# Patient Record
Sex: Male | Born: 1955 | Race: White | Hispanic: No | Marital: Married | Smoking: Current every day smoker
Health system: Southern US, Community
[De-identification: ages and names within clinical notes are randomized; demographics above are authoritative.]

## PROBLEM LIST (undated history)

## (undated) DIAGNOSIS — C169 Malignant neoplasm of stomach, unspecified: Secondary | ICD-10-CM

## (undated) DIAGNOSIS — K219 Gastro-esophageal reflux disease without esophagitis: Secondary | ICD-10-CM

## (undated) DIAGNOSIS — IMO0001 Reserved for inherently not codable concepts without codable children: Secondary | ICD-10-CM

## (undated) DIAGNOSIS — I10 Essential (primary) hypertension: Secondary | ICD-10-CM

## (undated) DIAGNOSIS — C801 Malignant (primary) neoplasm, unspecified: Secondary | ICD-10-CM

## (undated) HISTORY — PX: EXTERNAL EAR SURGERY: SHX627

## (undated) HISTORY — PX: HERNIA REPAIR: SHX51

## (undated) HISTORY — PX: OTHER SURGICAL HISTORY: SHX169

---

## 1998-09-23 ENCOUNTER — Emergency Department (HOSPITAL_COMMUNITY): Admission: EM | Admit: 1998-09-23 | Discharge: 1998-09-23 | Payer: Self-pay | Admitting: *Deleted

## 1998-09-23 ENCOUNTER — Encounter: Payer: Self-pay | Admitting: Emergency Medicine

## 1999-02-22 ENCOUNTER — Other Ambulatory Visit: Admission: RE | Admit: 1999-02-22 | Discharge: 1999-02-22 | Payer: Self-pay | Admitting: Orthopedic Surgery

## 1999-09-29 ENCOUNTER — Ambulatory Visit (HOSPITAL_BASED_OUTPATIENT_CLINIC_OR_DEPARTMENT_OTHER): Admission: RE | Admit: 1999-09-29 | Discharge: 1999-09-29 | Payer: Self-pay | Admitting: Orthopedic Surgery

## 2006-04-17 ENCOUNTER — Ambulatory Visit (HOSPITAL_COMMUNITY): Admission: RE | Admit: 2006-04-17 | Discharge: 2006-04-17 | Payer: Self-pay | Admitting: Internal Medicine

## 2006-04-17 ENCOUNTER — Ambulatory Visit: Payer: Self-pay | Admitting: Internal Medicine

## 2008-05-20 ENCOUNTER — Ambulatory Visit (HOSPITAL_COMMUNITY): Admission: RE | Admit: 2008-05-20 | Discharge: 2008-05-20 | Payer: Self-pay | Admitting: Family Medicine

## 2008-06-03 ENCOUNTER — Ambulatory Visit (HOSPITAL_COMMUNITY): Admission: RE | Admit: 2008-06-03 | Discharge: 2008-06-03 | Payer: Self-pay | Admitting: Family Medicine

## 2010-08-05 ENCOUNTER — Ambulatory Visit (HOSPITAL_COMMUNITY)
Admission: RE | Admit: 2010-08-05 | Discharge: 2010-08-05 | Payer: Self-pay | Source: Home / Self Care | Admitting: Family Medicine

## 2010-09-22 ENCOUNTER — Ambulatory Visit (HOSPITAL_COMMUNITY)
Admission: RE | Admit: 2010-09-22 | Discharge: 2010-09-22 | Payer: Self-pay | Source: Home / Self Care | Attending: Internal Medicine | Admitting: Internal Medicine

## 2010-10-09 ENCOUNTER — Encounter: Payer: Self-pay | Admitting: Orthopedic Surgery

## 2010-10-12 ENCOUNTER — Ambulatory Visit: Admit: 2010-10-12 | Payer: Self-pay | Admitting: Orthopedic Surgery

## 2011-02-04 NOTE — Op Note (Signed)
Randy Le, Randy Le               ACCOUNT NO.:  [id number]   MEDICAL RECORD NO.:  [id number]          PATIENT TYPE:  AMB   LOCATION:  DAY                           FACILITY:  [facility]   PHYSICIAN:  R. Roetta Sessions, M.D. DATE OF BIRTH:  01-26-1956   DATE OF PROCEDURE:  04/17/2006  DATE OF DISCHARGE:                                 OPERATIVE REPORT   SCREENING COLONOSCOPY   INDICATIONS FOR PROCEDURE:  The patient is a 55 year old Caucasian male  devoid of any lower GI tract symptoms sent over at the courtesy of Dr. Loraine Leriche  __________ for colorectal screening.  He has never had his lower GI tract  imaged.  There is no family history of colorectal neoplasia.  Colonoscopy is  now being done as a screening maneuver.  This approach has been discussed  with the patient at length.  Potential risks, benefits and alternatives have  been reviewed and questions answered.  He is agreeable.  Please see  documentation in the medical record.   PROCEDURE NOTE:  O2 saturation, blood pressure, pulse and respirations were  monitored throughout the entire procedure.   CONSCIOUS SEDATION:  Versed 4 mg IV, Demerol 100 mg IV in divided doses.   INSTRUMENT:  Olympus video chip system.   FINDINGS:  Digital rectal exam revealed no abnormalities.   ENDOSCOPIC FINDINGS:  Prep was adequate.   Rectum:  Examination of the rectal mucosa including retroflex view of the  anal verge revealed no abnormalities.   Colon:  Colonic mucosa was surveyed from the rectosigmoid junction through  the left transverse and right colon to the area of the appendiceal orifice,  ileocecal valve and cecum.  These structures were well seen and photographed  for the record.  The scope was withdrawn and all previously mentioned  mucosal surfaces were again seen.  The colonic mucosa appeared normal.  He  patient tolerated the procedure well and was reactive to endoscopy.   IMPRESSION:  1.  Internal hemorrhoids, otherwise normal  rectum.  2.  Normal colon.   RECOMMENDATIONS:  Repeat screening colonoscopy in 10 years.      Jonathon Bellows, M.D.  Electronically Signed     RMR/MEDQ  D:  04/17/2006  T:  04/17/2006  Job:  [id number]

## 2011-02-04 NOTE — Op Note (Signed)
[place]  Patient:    Randy Le                       MRN: [id number] Proc. Date: 09/29/99 Adm. Date:  [id number] Attending:  Ronne Binning                           Operative Report  PREOPERATIVE DIAGNOSIS:  Right ring finger volar laceration, with loss of profunda function.  POSTOPERATIVE DIAGNOSIS:  Right ring finger volar laceration, with loss of profunda function.  PROCEDURE:  Exploration and repair of flexor digitorum profundus tendon, zone 2 -- right ring finger.  SURGEON:  Artist Pais. Mina Marble, M.D.  ANESTHESIA:  Axillary block.  TOURNIQUET TIME:  61 minutes.  COMPLICATIONS:  None.  DRAINS:  None.  OPERATIVE REPORT:  The patient was taken to the operating room after the induction of adequate axillary block analgesia.  The right upper extremity was prepped and draped in the usual sterile fashion.  An Esmarch was used to exsanguinate the limb, and a tourniquet was inflated to 250 mmHg.  At this point and time transverse laceration of the PIP flexion crease was extended in mid lateral fashion, both proximally and distally to expose the underlying zone 2 area of the flexor tendon of the ring finger on the right.  Exploration revealed a complete profundus laceration and the profundus proximal belly had retracted into the palm.  A Carroll tendon retriever was used to retrieve the tendon and was pulled into the center of the wound.  At this point and time the distal aspect of the profundus tendon was found to be just under the distal edge of the A4 pulley.  It was gently pulled rom underneath the A4 pulley.  At this point and time #3 Ethibond was used in a modified Tagima-type suture on both tendon stumps; and then the distal stump was pulled under the A4 pulley, the proximal stump as well.  A repair was performed  just distal to the A4 pulley.  After the repair using the 3.0 Ethibond was performed, a 6.0 running  ______ Prolene stitch was used for the final repair. he wound was thoroughly irrigated and hemostasis achieved with bipolar cautery; closed with 4.0 and 5.0 nylon in a combination of simple horizontal mattress sutures.  Sterile dressing with xeroform, 4 x 4 ______ and a dorsal eccentric block splint was applied.  The patient tolerated the procedure well and went to the recovery  room in stable fashion. DD:  09/29/99 TD:  09/29/99 Job: [id number]

## 2011-02-04 NOTE — Op Note (Signed)
[place]  Patient:    Randy Le                       MRN: [id number] Proc. Date: 09/29/99 Adm. Date:  [id number] Attending:  Ronne Binning                           Operative Report  PREOPERATIVE DIAGNOSIS: Right ring finger volar laceration with loss of profundus function.  POSTOPERATIVE DIAGNOSIS: Right ring finger volar laceration with loss of profundus function.  OPERATION: Exploration and repair of flexor digitorum profundus tendon, zone 2,  right ring finger.  SURGEON: Artist Pais. Mina Marble, M.D.  ANESTHESIA: Axillary block.  TOURNIQUET TIME: 61 minutes.  COMPLICATIONS: None.  DRAINS: None.  DESCRIPTION OF PROCEDURE: The patient was taken to the operating room after the  induction of adequate axillary block analgesia.  The right upper extremity was prepped and draped in the usual sterile fashion.  An Esmarch was used to exsanguinate the limb and the tourniquet was inflated to 250 mmHg.  At this point in time a transverse laceration of the PIP flexion crease was extended in the midlateral fashion, both proximally and distally, to expose the underlying zone 2 area of the flexor tendon of the ring finger on the right.  Exploration revealed a complete profundus laceration and the profundus proximal belly had retracted into the palm.  A Carroll tendon retriever was used to retrieve the tendon.  It was pulled into the center of the wound. At this point in time the distal aspect of the profundus tendon was found to be just under the distal edge of the A4 pulley. t was gently pulled from underneath the A4 pulley.  At this point in time #3 Ethibond was used in a modified attachment type suture on both tendon stumps and then the distal stump was pulled under the A4 pulley and the proximal stump as well and  repair was performed just distal to the A4 pulley.  After the repair using the -0 Ethibond was performed a 6-0  running epitendinous Prolene stitch was used for the final repair.  The wound was thoroughly irrigated.  Hemostasis was achieved with bipolar cautery and then was closed with 4-0 and 5-0 nylon in a combination of simple and horizontal mattress sutures.  A sterile dressing of Xeroform, 4x4s, fluffs and a dorsal extension block splint was applied.  The patient tolerated he procedure well and went to the recovery room in stable fashion. DD:  09/29/99 TD:  09/29/99 Job: [id number]

## 2011-04-04 ENCOUNTER — Emergency Department (HOSPITAL_COMMUNITY): Payer: BC Managed Care – PPO

## 2011-04-04 ENCOUNTER — Inpatient Hospital Stay (HOSPITAL_COMMUNITY)
Admission: EM | Admit: 2011-04-04 | Discharge: 2011-04-16 | DRG: 148 | Disposition: A | Discharge status: Home / Self Care | Payer: BC Managed Care – PPO | Attending: General Surgery | Admitting: General Surgery

## 2011-04-04 ENCOUNTER — Encounter: Payer: Self-pay | Admitting: Emergency Medicine

## 2011-04-04 ENCOUNTER — Other Ambulatory Visit: Payer: Self-pay

## 2011-04-04 DIAGNOSIS — K5289 Other specified noninfective gastroenteritis and colitis: Secondary | ICD-10-CM | POA: Diagnosis present

## 2011-04-04 DIAGNOSIS — K450 Other specified abdominal hernia with obstruction, without gangrene: Principal | ICD-10-CM | POA: Diagnosis present

## 2011-04-04 DIAGNOSIS — E876 Hypokalemia: Secondary | ICD-10-CM | POA: Diagnosis not present

## 2011-04-04 DIAGNOSIS — D72829 Elevated white blood cell count, unspecified: Secondary | ICD-10-CM | POA: Diagnosis present

## 2011-04-04 DIAGNOSIS — R109 Unspecified abdominal pain: Secondary | ICD-10-CM

## 2011-04-04 DIAGNOSIS — D649 Anemia, unspecified: Secondary | ICD-10-CM | POA: Diagnosis present

## 2011-04-04 DIAGNOSIS — E875 Hyperkalemia: Secondary | ICD-10-CM | POA: Diagnosis not present

## 2011-04-04 DIAGNOSIS — K529 Noninfective gastroenteritis and colitis, unspecified: Secondary | ICD-10-CM | POA: Diagnosis present

## 2011-04-04 HISTORY — DX: Essential (primary) hypertension: I10

## 2011-04-04 LAB — COMPREHENSIVE METABOLIC PANEL
ALT: 14 U/L (ref 0–53)
Albumin: 4.1 g/dL (ref 3.5–5.2)
Alkaline Phosphatase: 80 U/L (ref 39–117)
Calcium: 10.2 mg/dL (ref 8.4–10.5)
Potassium: 3.6 mEq/L (ref 3.5–5.1)
Sodium: 137 mEq/L (ref 135–145)
Total Protein: 7.6 g/dL (ref 6.0–8.3)

## 2011-04-04 LAB — CBC
HCT: 44.4 % (ref 39.0–52.0)
Hemoglobin: 15.6 g/dL (ref 13.0–17.0)
MCH: 34.3 pg — ABNORMAL HIGH (ref 26.0–34.0)
MCHC: 35.1 g/dL (ref 30.0–36.0)
MCV: 97.6 fL (ref 78.0–100.0)
Platelets: 189 K/uL (ref 150–400)
RBC: 4.55 MIL/uL (ref 4.22–5.81)
RDW: 13.1 % (ref 11.5–15.5)
WBC: 10.9 K/uL — ABNORMAL HIGH (ref 4.0–10.5)

## 2011-04-04 LAB — URINALYSIS, ROUTINE W REFLEX MICROSCOPIC
Bilirubin Urine: NEGATIVE
Glucose, UA: NEGATIVE mg/dL
Leukocytes, UA: NEGATIVE
Specific Gravity, Urine: 1.015 (ref 1.005–1.030)
pH: 7 (ref 5.0–8.0)

## 2011-04-04 LAB — DIFFERENTIAL
Basophils Absolute: 0 10*3/uL (ref 0.0–0.1)
Basophils Relative: 0 % (ref 0–1)
Eosinophils Absolute: 0 10*3/uL (ref 0.0–0.7)
Neutrophils Relative %: 84 % — ABNORMAL HIGH (ref 43–77)

## 2011-04-04 LAB — AMYLASE: Amylase: 67 U/L (ref 0–105)

## 2011-04-04 MED ORDER — PANTOPRAZOLE SODIUM 40 MG IV SOLR
40.0000 mg | INTRAVENOUS | Status: DC
  Administered 2011-04-04: 40 mg via INTRAVENOUS
  Filled 2011-04-04: qty 40

## 2011-04-04 MED ORDER — ENALAPRILAT 1.25 MG/ML IV SOLN
1.2500 mg | Freq: Four times a day (QID) | INTRAVENOUS | Status: DC
  Administered 2011-04-04 – 2011-04-05 (×3): 1.25 mg via INTRAVENOUS
  Filled 2011-04-04 (×3): qty 2

## 2011-04-04 MED ORDER — AMPICILLIN-SULBACTAM SODIUM 3 (2-1) G IJ SOLR
3.0000 g | Freq: Four times a day (QID) | INTRAMUSCULAR | Status: DC
  Administered 2011-04-04 – 2011-04-05 (×3): 3 g via INTRAVENOUS
  Filled 2011-04-04 (×2): qty 3

## 2011-04-04 MED ORDER — METOPROLOL TARTRATE 1 MG/ML IV SOLN
5.0000 mg | Freq: Four times a day (QID) | INTRAVENOUS | Status: DC
  Administered 2011-04-05 – 2011-04-12 (×30): 5 mg via INTRAVENOUS
  Filled 2011-04-04 (×28): qty 5

## 2011-04-04 MED ORDER — SODIUM CHLORIDE 0.9 % IV SOLN
INTRAVENOUS | Status: DC
  Administered 2011-04-04: 1000 mL via INTRAVENOUS
  Administered 2011-04-05: 125 mL via INTRAVENOUS
  Administered 2011-04-06 – 2011-04-08 (×5): via INTRAVENOUS

## 2011-04-04 MED ORDER — SODIUM CHLORIDE 0.9 % IV BOLUS (SEPSIS)
1500.0000 mL | Freq: Once | INTRAVENOUS | Status: AC
  Administered 2011-04-04: 1500 mL via INTRAVENOUS

## 2011-04-04 MED ORDER — HYDROMORPHONE HCL 1 MG/ML IJ SOLN
2.0000 mg | INTRAMUSCULAR | Status: DC | PRN
  Administered 2011-04-04 – 2011-04-06 (×11): 2 mg via INTRAVENOUS
  Filled 2011-04-04 (×11): qty 2

## 2011-04-04 MED ORDER — HYDROMORPHONE HCL 2 MG/ML IJ SOLN
2.0000 mg | Freq: Once | INTRAMUSCULAR | Status: AC
  Administered 2011-04-04: 2 mg via INTRAVENOUS
  Filled 2011-04-04: qty 1

## 2011-04-04 MED ORDER — HYDROMORPHONE HCL 2 MG/ML IJ SOLN: 2.0000 mg | INTRAMUSCULAR | Status: DC | PRN

## 2011-04-04 MED ORDER — ONDANSETRON HCL 4 MG/2ML IJ SOLN
4.0000 mg | Freq: Four times a day (QID) | INTRAMUSCULAR | Status: DC | PRN
  Administered 2011-04-04: 4 mg via INTRAVENOUS
  Filled 2011-04-04: qty 2

## 2011-04-04 MED ORDER — MORPHINE SULFATE 4 MG/ML IJ SOLN
4.0000 mg | INTRAMUSCULAR | Status: DC | PRN
  Administered 2011-04-04 (×3): 4 mg via INTRAVENOUS
  Filled 2011-04-04 (×3): qty 1

## 2011-04-04 NOTE — H&P (Signed)
NAMEBENNET, Randy Le NO.:  [id number]  MEDICAL RECORD NO.:  [id number]  LOCATION:  [facility]09                         FACILITY:  [facility]  PHYSICIAN:  Talmage Nap, MD  DATE OF BIRTH:  [birth year]  DATE OF ADMISSION:  04/04/2011 DATE OF DISCHARGE:  LH                             HISTORY & PHYSICAL   PRIMARY CARE PHYSICIAN:  Unknown.  History was obtainable from the patient and the patient's spouse.  CHIEF COMPLAINT:  Abdominal pain which started at about 10:30 a.m. today, April 04, 2011.  The patient is a 55 year old Caucasian male with history of hypertension  was said to have been in stable health until about 10:30 a.m. today, while the patient was at work developed abdominal pain.  Pain was initially 4/10 in intensity and became progressively worse about 10/10 in intensity.  Pain was initially periumbilical and thereafter located in the right lower quadrant.Pain is described as colicky.  This was said to be associated with nausea.  The patient had one episode of vomiting.  He claimed he was also sweaty.  He denied any frank fever.  No chills.  No rigor.  He also denied any history of diarrhea or constipation.  He denied any dysuria or hematuria.  There is no known relieving or aggravating factor.  Pain is said to double the patient, and the patient has extreme difficulty in assuming an erect position.  The pain in the periumbilical as well as in the right lower quadrant was said to have persisted, hence the patient presented to the emergency room to be evaluated.  PAST MEDICAL HISTORY: Hypertension.  PAST SURGICAL HISTORY: 1. Hernia repair. 2. Left wrist fracture status post open reduction and internal     fixation, no implant. 3. Cyst removal from the lower back.  PREADMISSION MEDICATIONS: 1. Metoprolol 100 mg p.o. daily. 2. Multivitamin one p.o. daily.  He has no known allergies.  SOCIAL HISTORY:  The patient smokes about a pack of cigarettes per  day and has been doing that for the past 30 years and takes alcohol as well, cannot quantify how much he drinks and he works in the [employer].  FAMILY HISTORY:  York Spaniel to be positive for hypertension.  REVIEW OF SYSTEMS:  The patient denies any history of headaches. Complained about nausea but presently no vomiting.  Denies any fever. No chills.  No rigor.  No chest pain or shortness of breath.  Complained of periumbilical as well as the right lower quadrant pain with associated nausea but no vomiting.  No diarrhea or constipation.  No dysuria or hematuria.  No swelling of the lower extremities.  No intolerance to heat or cold.  No neuropsychiatric disorder.  PHYSICAL EXAMINATION:  GENERAL:  A middle-aged man, dehydrated, in painful distress, holding to his abdomen. PRESENT VITAL SIGNS:  Temperature is 97.7, pulse is 58, respiratory rate is 18, blood pressure is 118/78, saturation 100% on room air. HEENT:  Pupils are reactive to light and extraocular muscles are intact. NECK:  No jugular venous distention.  No carotid bruit.  No lymphadenopathy. CHEST:  Clear to auscultation. CARDIAC:  Heart sounds are one and two. ABDOMEN:  Soft with periumbilical as  well as right lower quadrant tenderness, tenderness is maximal at the McBurney's point.  Slight guarding.  No rigidity.  Liver, spleen, kidney not palpable.  Bowel sounds are hypoactive. EXTREMITIES:  No pedal edema. NEUROLOGIC:  Nonfocal. MUSCULOSKELETAL:  Unremarkable. SKIN:  Decreased turgor.  LABORATORY DATA:  Chemistry showed a sodium of 137, potassium of 3.6, chloride of 90 with a bicarb of 27, BUN is 11, creatinine is 0.81, glucose is 127.  LFTs showed alkaline phosphatase 80, albumin is 4.1, AST 21, ALT 14.  Complete blood count with differential showed WBC of 10.9, hemoglobin of 15.6, hematocrit of 44.4, MCV of 97.6 with a platelet count of 189.  Differential, neutrophils 84% and absolute neutrophil count is  9.2.  IMAGING STUDIES:  CT of abdomen without contrast showed abnormal appearance of small bowel, most notably involving the portion of the ileum, presume possibility of enteritis with free fluid throughout the abdomen.  PROBLEM LIST: 1. Abdominal pain (periumbilical as well as right lower quadrant). 2. Nausea. 3. Leukocytosis with left shift. 4. Dehydration.  IMPRESSION: 1. Abdominal pain, periumbilical as well as right lower quadrant with     tenderness at McBurney point, questionable subacute appendicitis. 2. Dehydration. 3. Hypertension.  Plan is to admit the patient to the general medical floor.  The patient will be n.p.o. for now.  He will be continued on normal saline IV to go at a rate of 200 mL/hour.  He will also be on Unasyn 3 g IV q.6 h.  Pain control will be done with Dilaudid 2 mg IV q.6 h. and his  blood pressure will be controlled with Lopressor 5 mg IV q.6 h. p.r.n. for heart rate greater than 90 and also Vasotec 1.25 mg IV q.6 h.  He will also be on Zofran IV for nausea.  GI prophylaxis will be done with Protonix 40 mg IV q.24 h. and DVT prophylaxis will be done with TED stockings. Further lab work to be ordered on this patient will include a serum amylase and lipase, CBC, CMP, and magnesium will be repeated in the a.m. Finally, if the patient's abdominal pain persists, Surgery will be consulted to evaluate the patient for subacute appendicitis for possible surgery.  He will be followed and evaluated on day-to-day basis.     Talmage Nap, MD     CN/MEDQ  D:  04/04/2011  T:  04/04/2011  Job:  [id number]

## 2011-04-04 NOTE — ED Provider Notes (Signed)
History     Chief Complaint  Patient presents with  . Abdominal Pain    nausea   HPI Comments: Pt c/o diffuse abd pain, constant since 10:30am this morning, described as "severe pain", with associated intermittent nausea. Pt reports he cannot get comfortable, has to keep moving. No exacerbating or relieving factors. No vomiting, bloating, or diarrhea. Also c/o feels thirsty. No h/o abd surgeries. +etoh use, recently 2-3 beers/day during the week and 6-8 beers/day on the weekends.   Patient is a 56 y.o. male presenting with abdominal pain. The history is provided by the patient.  Abdominal Pain The primary symptoms of the illness include abdominal pain and nausea. The primary symptoms of the illness do not include fever, shortness of breath, vomiting, diarrhea, hematemesis, dysuria or vaginal discharge. The current episode started 3 to 5 hours ago. The problem has not changed since onset. The abdominal pain is generalized (worse in RLQ). The abdominal pain does not radiate. The abdominal pain is relieved by nothing. Exacerbated by: nothing.  The patient has not had a change in bowel habit. Symptoms associated with the illness do not include chills, anorexia, diaphoresis, heartburn, constipation, hematuria or back pain.    Past Medical History  Diagnosis Date  . Hypertension     Past Surgical History  Procedure Date  . Hernia repair     History reviewed. No pertinent family history.  History  Substance Use Topics  . Smoking status: Smoker, Current Status Unknown -- 1.0 packs/day  . Smokeless tobacco: Not on file  . Alcohol Use: 0.0 oz/week    3-4 Cans of beer per week     daily      Review of Systems  Constitutional: Negative for fever, chills and diaphoresis.  Respiratory: Negative for shortness of breath.   Gastrointestinal: Positive for nausea and abdominal pain. Negative for heartburn, vomiting, diarrhea, constipation, anorexia and hematemesis.  Genitourinary: Negative  for dysuria, hematuria and vaginal discharge.  Musculoskeletal: Negative for back pain.  All other systems reviewed and are negative.    Physical Exam  Pulse 58  Temp(Src) 97.7 F (36.5 C) (Oral)  Resp 22  Ht 5\' 8"  (1.727 m)  SpO2 100%  Physical Exam  Constitutional: He is oriented to person, place, and time. He appears well-developed and well-nourished. He is cooperative.  Non-toxic appearance. He does not appear ill. No distress.       Pt is having trouble staying still, getting up and down off the stretcher. Denies any back or flank pain.   HENT:  Head: Normocephalic and atraumatic.  Right Ear: External ear normal.  Left Ear: External ear normal.  Nose: Nose normal. No mucosal edema or rhinorrhea.  Mouth/Throat: Oropharynx is clear and moist. Mucous membranes are dry. No dental abscesses or uvula swelling.  Eyes: Conjunctivae and EOM are normal. Pupils are equal, round, and reactive to light.  Neck: Normal range of motion and full passive range of motion without pain. Neck supple.  Cardiovascular: Normal rate, regular rhythm and normal heart sounds.  Exam reveals no gallop and no friction rub.   No murmur heard. Pulmonary/Chest: Effort normal and breath sounds normal. Not tachypneic. No respiratory distress. He has no wheezes. He has no rhonchi. He has no rales. He exhibits no tenderness and no crepitus.  Abdominal: Soft. Normal appearance. He exhibits no distension. Bowel sounds are decreased. There is tenderness in the right lower quadrant. There is positive Murphy's sign. There is no rebound, no guarding and no CVA tenderness.  Musculoskeletal: He exhibits no edema and no tenderness.       Thoracic back: He exhibits decreased range of motion, tenderness, bony tenderness, pain and spasm. He exhibits no swelling, no edema and no deformity.       Lumbar back: He exhibits decreased range of motion, tenderness, bony tenderness, pain and spasm. He exhibits no swelling, no edema and no  deformity.       Patellar reflexes +2 and = bilaterally SLR neg bilaterally  Neurological: He is alert and oriented to person, place, and time. He has normal strength and normal reflexes. No cranial nerve deficit.  Skin: Skin is warm, dry and intact. No rash noted. No erythema. No pallor.  Psychiatric: His speech is normal. Thought content normal. His mood appears anxious. He is agitated.    ED Course  Procedures Written by Enos Fling acting as scribe for Dr. Lynelle Doctor.  MDM    I personally performed the services described in this documentation, which was scribed in my presence. The recorded information has been reviewed and considered.  Pt given IV nausea and pain meds.    Date: 04/04/2011  Rate:58 Rhythm: sinus bradycardia and sinus arrhythmia  QRS Axis: normal  Intervals: normal  ST/T Wave abnormalities: normal  Conduction Disutrbances:none  Narrative Interpretation:   Old EKG Reviewed: none available     5:48 PMDr Constance Goltz, radiology called CT results, has severe inflammation of his small bowel with free fluid. Does not appear to be appendicitis as primary cause. Does not feel contrast CT will give more information.   Pt given IV pain and nausea meds.   Ward Givens, MD 04/04/11 Windell Moment

## 2011-04-04 NOTE — H&P (Signed)
  [id number] 

## 2011-04-04 NOTE — ED Notes (Signed)
Pt c/o generalized abd pain worsening since 1030 this am. Some n. Denies v/d. lnbm today.

## 2011-04-04 NOTE — ED Notes (Signed)
Left urinal at bedside and advised patient we need urine specimen

## 2011-04-04 NOTE — ED Notes (Signed)
Called and gave report to [facility]

## 2011-04-05 DIAGNOSIS — K529 Noninfective gastroenteritis and colitis, unspecified: Secondary | ICD-10-CM | POA: Diagnosis present

## 2011-04-05 LAB — COMPREHENSIVE METABOLIC PANEL WITH GFR
ALT: 9 U/L (ref 0–53)
AST: 14 U/L (ref 0–37)
Albumin: 3.2 g/dL — ABNORMAL LOW (ref 3.5–5.2)
Alkaline Phosphatase: 67 U/L (ref 39–117)
BUN: 20 mg/dL (ref 6–23)
CO2: 26 meq/L (ref 19–32)
Calcium: 8.9 mg/dL (ref 8.4–10.5)
Chloride: 103 meq/L (ref 96–112)
Creatinine, Ser: 1.21 mg/dL (ref 0.50–1.35)
GFR calc Af Amer: >60 mL/min
GFR calc non Af Amer: >60 mL/min
Glucose, Bld: 244 mg/dL — ABNORMAL HIGH (ref 70–99)
Potassium: 5.2 meq/L — ABNORMAL HIGH (ref 3.5–5.1)
Sodium: 137 meq/L (ref 135–145)
Total Bilirubin: 0.9 mg/dL (ref 0.3–1.2)
Total Protein: 6.2 g/dL (ref 6.0–8.3)

## 2011-04-05 MED ORDER — SODIUM CHLORIDE 0.9 % IV SOLN
INTRAVENOUS | Status: AC
  Filled 2011-04-05 (×2): qty 3

## 2011-04-05 MED ORDER — SODIUM CHLORIDE 0.9 % IV SOLN
3.0000 g | Freq: Four times a day (QID) | INTRAVENOUS | Status: DC
  Administered 2011-04-05 – 2011-04-06 (×5): 3 g via INTRAVENOUS
  Filled 2011-04-05 (×9): qty 3

## 2011-04-05 NOTE — Progress Notes (Addendum)
Subjective: Feels better today, but still requiring pain meds.  Thirsty.  No emesis or diarrhea.    Objective: Vital signs in last 24 hours: Temp:  [97.7 F (36.5 C)-98 F (36.7 C)] 97.7 F (36.5 C) (07/17 0612) Pulse Rate:  [58-93] 74  (07/17 0612) Resp:  [18-24] 20  (07/17 0612) BP: (113-189)/(73-105) 113/73 mmHg (07/17 0612) SpO2:  [94 %-100 %] 96 % (07/17 0612) Weight change:  Last BM Date: 04/04/11  Intake/Output from previous day:   Intake/Output this shift:    General appearance: alert, cooperative and no distress. Able to get OOB easily Head: Normocephalic, without obvious abnormality, atraumatic, Dry MM Resp: clear to auscultation bilaterally Cardio: regular rate and rhythm, S1, S2 normal, no murmur, click, rub or gallop GI: Soft, Non distended, normal bowel sounds. Right sided tenderness with guarding. No rebound tenderness Extremities: extremities normal, atraumatic, no cyanosis or edema  Lab Results:  Basename 04/04/11 1611  WBC 10.9*  HGB 15.6  HCT 44.4  PLT 189   BMET  Basename 04/05/11 0552 04/04/11 1611  NA 137 137  K 5.2* 3.6  CL 103 98  CO2 26 27  GLUCOSE 244* 127*  BUN 20 11  CREATININE 1.21 0.81  CALCIUM 8.9 10.2    Studies/Results: Reviewed scans  Assessment/Plan: Small bowel enteritis, probably infectious.  Pain, nausea improving.  Start clears.  Continue abx and serial exams. Monitor K+ and d/c vasotec   LOS: 1 day   Rayford Williamsen L 04/05/2011, 11:34 AM

## 2011-04-06 ENCOUNTER — Inpatient Hospital Stay (HOSPITAL_COMMUNITY): Payer: BC Managed Care – PPO

## 2011-04-06 DIAGNOSIS — A09 Infectious gastroenteritis and colitis, unspecified: Secondary | ICD-10-CM

## 2011-04-06 LAB — BASIC METABOLIC PANEL
GFR calc Af Amer: >60 mL/min (ref >60)
GFR calc non Af Amer: >60 mL/min (ref >60)
Glucose, Bld: 145 mg/dL — ABNORMAL HIGH (ref 70–99)
Potassium: 4.3 mEq/L (ref 3.5–5.1)
Sodium: 137 mEq/L (ref 135–145)

## 2011-04-06 LAB — CBC
Hemoglobin: 11.6 g/dL — ABNORMAL LOW (ref 13.0–17.0)
MCH: 34.3 pg — ABNORMAL HIGH (ref 26.0–34.0)
RBC: 3.38 MIL/uL — ABNORMAL LOW (ref 4.22–5.81)
WBC: 15.8 10*3/uL — ABNORMAL HIGH (ref 4.0–10.5)

## 2011-04-06 LAB — LACTIC ACID, PLASMA: Lactic Acid, Venous: 1.7 mmol/L (ref 0.5–2.2)

## 2011-04-06 MED ORDER — HYDROMORPHONE HCL 1 MG/ML IJ SOLN
2.0000 mg | INTRAMUSCULAR | Status: DC | PRN
  Administered 2011-04-06 – 2011-04-08 (×10): 2 mg via INTRAVENOUS
  Filled 2011-04-06 (×12): qty 2

## 2011-04-06 MED ORDER — SODIUM CHLORIDE 0.9 % IV SOLN
INTRAVENOUS | Status: AC
  Filled 2011-04-06: qty 3

## 2011-04-06 MED ORDER — METRONIDAZOLE IN NACL 5-0.79 MG/ML-% IV SOLN
500.0000 mg | Freq: Four times a day (QID) | INTRAVENOUS | Status: DC
  Administered 2011-04-06 – 2011-04-08 (×8): 500 mg via INTRAVENOUS
  Filled 2011-04-06 (×20): qty 100

## 2011-04-06 MED ORDER — CIPROFLOXACIN IN D5W 400 MG/200ML IV SOLN
400.0000 mg | Freq: Two times a day (BID) | INTRAVENOUS | Status: DC
  Administered 2011-04-06 – 2011-04-08 (×4): 400 mg via INTRAVENOUS
  Filled 2011-04-06 (×10): qty 200

## 2011-04-06 NOTE — Plan of Care (Signed)
Problem: Consults Goal: Diabetes Guidelines if Diabetic/Glucose > 140 If diabetic or lab glucose is > 140 mg/dl - Initiate Diabetes/Hyperglycemia Guidelines & Document Interventions  Outcome: Completed/Met Date Met:  04/06/11 Path reviewed and patient assessed.     

## 2011-04-06 NOTE — Consult Note (Signed)
Dictated;  [id number].

## 2011-04-06 NOTE — Progress Notes (Signed)
  Subjective: Worse pain after starting clears.  Couldn't sleep due to pain.  Worse with movement.  Bloated.  No stool or flatus.  No N/V.  Had coffee, soda at around 7 am. A few sips of water at 9:30  Objective: Vital signs in last 24 hours: Temp:  [96.1 F (35.6 C)-98.2 F (36.8 C)] 98.2 F (36.8 C) (07/17 2107) Pulse Rate:  [65-88] 87  (07/18 0600) Resp:  [17-18] 17  (07/18 0600) BP: (113-148)/(70-83) 129/75 mmHg (07/18 0600) SpO2:  [93 %-97 %] 93 % (07/17 2107) Weight change:  Last BM Date: 04/04/11  Intake/Output from previous day: 07/17 0701 - 07/18 0700 In: 1325 [P.O.:950; I.V.:375] Out: -  Intake/Output this shift:    General appearance: anxious.  Winces with repositioning. Head: Normocephalic, without obvious abnormality, atraumatic, MMM, no thrush Resp: clear to auscultation bilaterally Cardio: regular rate and rhythm, S1, S2 normal, no murmur, click, rub or gallop GI: Diminished BS, MUCH more distended. Right sided tenderness with more guarding.  No rebound tenderness. Extremities: extremities normal, atraumatic, no cyanosis or edema  Lab Results:  Basename 04/06/11 0542 04/04/11 1611  WBC 15.8* 10.9*  HGB 11.6* 15.6  HCT 33.9* 44.4  PLT 142* 189   BMET  Basename 04/06/11 0542 04/05/11 0552  NA 137 137  K 4.3 5.2*  CL 103 103  CO2 26 26  GLUCOSE 145* 244*  BUN 19 20  CREATININE 0.73 1.21  CALCIUM 8.7 8.9    Studies/Results: No new  Assessment/Plan: Small bowel enteritis:  Pain, distention, leukocytosis worse.  Stat KUB, r/o obstruction, ileus.  Consult surgery.  Appendicitis, despite non-definitive CT scan?  Strict NPO.  Discussed with Dr. Lovell Sheehan Hyperkalemia resolved. Htn ok.   LOS: 2 days   Junice Fei L 04/06/2011, 10:20 AM

## 2011-04-06 NOTE — Consult Note (Signed)
Reason for Consult: Small bowel obstruction Referring Physician: Hospitalist, Torrence Hammack is an 55 y.o. male.  HPI: Patient is a 55 year old white male who was is in his usual state of health until 2 days ago when he began experiencing abdominal pain. He presented to the emergency room and a CT scan of the abdomen and pelvis revealed distal small bowel enteritis. There was no evidence of appendicitis. He was started on antibiotics. When they started advancing his diet, he began experiencing worsening abdominal pain and distention. No emesis has been noted. He has never had an episode like this before. His last colonoscopy was approximately 5 years ago by Dr. Charleston Poot of gastroenterology. He denies any fever or chills. There is no family medical history of Crohn's disease or ulcerative colitis.  Past Medical History  Diagnosis Date  . Hypertension     Past Surgical History  Procedure Date  . Hernia repair     History reviewed. No pertinent family history.  Social History:  reports that he has been smoking.  He does not have any smokeless tobacco history on file. He reports that he drinks alcohol. He reports that he does not use illicit drugs.  Allergies: No Known Allergies  Medications:  Anti-infectives     Start     Dose/Rate Route Frequency Ordered Stop   04/04/11 1800   Ampicillin-Sulbactam (UNASYN) 3 g in sodium chloride 0.9 % 100 mL IVPB  Status:  Discontinued        3 g 100 mL/hr over 60 Minutes Intravenous Every 6 hours 04/04/11 1755 04/05/11 1144          Results for orders placed during the hospital encounter of 04/04/11 (from the past 48 hour(s))  CBC     Status: Abnormal   Collection Time   04/04/11  4:11 PM      Component Value Range Comment   WBC 10.9 (*) 4.0 - 10.5 (K/uL)    RBC 4.55  4.22 - 5.81 (MIL/uL)    Hemoglobin 15.6  13.0 - 17.0 (g/dL)    HCT 16.1  09.6 - 04.5 (%)    MCV 97.6  78.0 - 100.0 (fL)    MCH 34.3 (*) 26.0 - 34.0 (pg)    MCHC  35.1  30.0 - 36.0 (g/dL)    RDW 40.9  81.1 - 91.4 (%)    Platelets 189  150 - 400 (K/uL)   DIFFERENTIAL     Status: Abnormal   Collection Time   04/04/11  4:11 PM      Component Value Range Comment   Neutrophils Relative 84 (*) 43 - 77 (%)    Neutro Abs 9.2 (*) 1.7 - 7.7 (K/uL)    Lymphocytes Relative 11 (*) 12 - 46 (%)    Lymphs Abs 1.2  0.7 - 4.0 (K/uL)    Monocytes Relative 5  3 - 12 (%)    Monocytes Absolute 0.5  0.1 - 1.0 (K/uL)    Eosinophils Relative 0  0 - 5 (%)    Eosinophils Absolute 0.0  0.0 - 0.7 (K/uL)    Basophils Relative 0  0 - 1 (%)    Basophils Absolute 0.0  0.0 - 0.1 (K/uL)   COMPREHENSIVE METABOLIC PANEL     Status: Abnormal   Collection Time   04/04/11  4:11 PM      Component Value Range Comment   Sodium 137  135 - 145 (mEq/L)    Potassium 3.6  3.5 - 5.1 (mEq/L)  Chloride 98  96 - 112 (mEq/L)    CO2 27  19 - 32 (mEq/L)    Glucose, Bld 127 (*) 70 - 99 (mg/dL)    BUN 11  6 - 23 (mg/dL)    Creatinine, Ser 4.69  0.50 - 1.35 (mg/dL)    Calcium 62.9  8.4 - 10.5 (mg/dL)    Total Protein 7.6  6.0 - 8.3 (g/dL)    Albumin 4.1  3.5 - 5.2 (g/dL)    AST 21  0 - 37 (U/L)    ALT 14  0 - 53 (U/L)    Alkaline Phosphatase 80  39 - 117 (U/L)    Total Bilirubin 0.6  0.3 - 1.2 (mg/dL)    GFR calc non Af Amer >60  >60 (mL/min)    GFR calc Af Amer >60  >60 (mL/min)   URINALYSIS, ROUTINE W REFLEX MICROSCOPIC     Status: Abnormal   Collection Time   04/04/11  7:00 PM      Component Value Range Comment   Color, Urine YELLOW  YELLOW     Appearance CLOUDY (*) CLEAR     Specific Gravity, Urine 1.015  1.005 - 1.030     pH 7.0  5.0 - 8.0     Glucose, UA NEGATIVE  NEGATIVE (mg/dL)    Hgb urine dipstick NEGATIVE  NEGATIVE     Bilirubin Urine NEGATIVE  NEGATIVE     Ketones, ur TRACE (*) NEGATIVE (mg/dL)    Protein, ur NEGATIVE  NEGATIVE (mg/dL)    Urobilinogen, UA 0.2  0.0 - 1.0 (mg/dL)    Nitrite NEGATIVE  NEGATIVE     Leukocytes, UA NEGATIVE  NEGATIVE  MICROSCOPIC NOT DONE ON  URINES WITH NEGATIVE PROTEIN, BLOOD, LEUKOCYTES, NITRITE, OR GLUCOSE <1000 mg/dL.  MAGNESIUM     Status: Normal   Collection Time   04/04/11  9:00 PM      Component Value Range Comment   Magnesium 2.0  1.5 - 2.5 (mg/dL)   AMYLASE     Status: Normal   Collection Time   04/04/11  9:00 PM      Component Value Range Comment   Amylase 67  0 - 105 (U/L)   LIPASE, BLOOD     Status: Normal   Collection Time   04/04/11  9:00 PM      Component Value Range Comment   Lipase 31  11 - 59 (U/L)   COMPREHENSIVE METABOLIC PANEL     Status: Abnormal   Collection Time   04/05/11  5:52 AM      Component Value Range Comment   Sodium 137  135 - 145 (mEq/L)    Potassium 5.2 (*) 3.5 - 5.1 (mEq/L) DELTA CHECK NOTED   Chloride 103  96 - 112 (mEq/L)    CO2 26  19 - 32 (mEq/L)    Glucose, Bld 244 (*) 70 - 99 (mg/dL)    BUN 20  6 - 23 (mg/dL)    Creatinine, Ser 5.28  0.50 - 1.35 (mg/dL)    Calcium 8.9  8.4 - 10.5 (mg/dL)    Total Protein 6.2  6.0 - 8.3 (g/dL)    Albumin 3.2 (*) 3.5 - 5.2 (g/dL)    AST 14  0 - 37 (U/L)    ALT 9  0 - 53 (U/L)    Alkaline Phosphatase 67  39 - 117 (U/L)    Total Bilirubin 0.9  0.3 - 1.2 (mg/dL)    GFR calc non Af Amer >  60  >60 (mL/min)    GFR calc Af Amer >60  >60 (mL/min)   BASIC METABOLIC PANEL     Status: Abnormal   Collection Time   04/06/11  5:42 AM      Component Value Range Comment   Sodium 137  135 - 145 (mEq/L)    Potassium 4.3  3.5 - 5.1 (mEq/L) DELTA CHECK NOTED   Chloride 103  96 - 112 (mEq/L)    CO2 26  19 - 32 (mEq/L)    Glucose, Bld 145 (*) 70 - 99 (mg/dL)    BUN 19  6 - 23 (mg/dL)    Creatinine, Ser 9.14  0.50 - 1.35 (mg/dL)    Calcium 8.7  8.4 - 10.5 (mg/dL)    GFR calc non Af Amer >60  >60 (mL/min)    GFR calc Af Amer >60  >60 (mL/min)   CBC     Status: Abnormal   Collection Time   04/06/11  5:42 AM      Component Value Range Comment   WBC 15.8 (*) 4.0 - 10.5 (K/uL)    RBC 3.38 (*) 4.22 - 5.81 (MIL/uL)    Hemoglobin 11.6 (*) 13.0 - 17.0 (g/dL)     HCT 78.2 (*) 95.6 - 52.0 (%)    MCV 100.3 (*) 78.0 - 100.0 (fL)    MCH 34.3 (*) 26.0 - 34.0 (pg)    MCHC 34.2  30.0 - 36.0 (g/dL)    RDW 21.3  08.6 - 57.8 (%)    Platelets 142 (*) 150 - 400 (K/uL) DELTA CHECK NOTED    No results found.  ROS: Positive tobacco Blood pressure 129/75, pulse 87, temperature 98.2 F (36.8 C), temperature source Oral, resp. rate 17, height 5\' 8"  (1.727 m), SpO2 93.00%. Physical Exam: Well-developed/well-nourished white male in no acute distress.  Abdomen: Soft, but distended. Not tense. No hepatosplenomegaly, masses, or hernias are identified.  Rectal: Deferred at this time. CT scan report noted.  Assessment/Plan: Small bowel obstruction secondary to small bowel enteritis. Differential diagnosis includes infectious, ischemic, and autoimmune diseases.  Plan: The patient should be on just sips of clears. Should he start having emesis, a nasogastric tube would be needed. Recommend GI consultation. No need for urgent operative intervention. I will follow the patient closely with you.  Randy Le A 04/06/2011, 11:39 AM

## 2011-04-07 LAB — CBC
HCT: 27.7 % — ABNORMAL LOW (ref 39.0–52.0)
MCV: 100.4 fL — ABNORMAL HIGH (ref 78.0–100.0)
RBC: 2.76 MIL/uL — ABNORMAL LOW (ref 4.22–5.81)
WBC: 12.7 10*3/uL — ABNORMAL HIGH (ref 4.0–10.5)

## 2011-04-07 LAB — BASIC METABOLIC PANEL
CO2: 28 mEq/L (ref 19–32)
Chloride: 102 mEq/L (ref 96–112)
Potassium: 4.1 mEq/L (ref 3.5–5.1)
Sodium: 134 mEq/L — ABNORMAL LOW (ref 135–145)

## 2011-04-07 LAB — DIFFERENTIAL
Eosinophils Relative: 0 % (ref 0–5)
Lymphocytes Relative: 5 % — ABNORMAL LOW (ref 12–46)
Lymphs Abs: 0.6 10*3/uL — ABNORMAL LOW (ref 0.7–4.0)
Monocytes Absolute: 1.3 10*3/uL — ABNORMAL HIGH (ref 0.1–1.0)
Neutro Abs: 10.7 10*3/uL — ABNORMAL HIGH (ref 1.7–7.7)

## 2011-04-07 MED ORDER — BISACODYL 10 MG RE SUPP
10.0000 mg | Freq: Once | RECTAL | Status: AC
  Administered 2011-04-07: 10 mg via RECTAL
  Filled 2011-04-07: qty 1

## 2011-04-07 NOTE — Progress Notes (Addendum)
Subjective: This man continues to have abdominal tenderness/pain. He has no appetite. Thankfully, he is not vomiting. His bowels have not opened nor has he passed any gas. Dr. Franky Macho, surgery saw him this morning and has ordered a nasogastric tube.           Physical Exam: The vital signs JYN:WGNF:  [98.3 F (36.8 C)-99.1 F (37.3 C)] 98.3 F (36.8 C) (07/19 0519) Pulse Rate:  [97-103] 102  (07/19 0519) Resp:  [16-18] 18  (07/19 0519) BP: (120-132)/(68-73) 132/73 mmHg (07/19 0519) SpO2:  [91 %-93 %] 92 % (07/19 0519) Weight:  [73.8 kg (162 lb 11.2 oz)] 162 lb 11.2 oz (73.8 kg) (07/18 2010) Although he looks uncomfortable, he is systemically well. He is nontoxic. Abdomen: Distended and tense. He is tender on superficial palpation. Bowel sounds are heard but are scarce. There is no hepatosplenomegaly. There are no other masses felt. He has no neck or supraclavicular lymphadenopathy. Cardiovascular: Heart sounds are present and normal without murmurs. Respiratory: Lung fields are clear. He is alert and oriented without any focal neurological signs.   Investigations: Results for orders placed during the hospital encounter of 04/04/11 (from the past 24 hour(s))  LACTIC ACID, PLASMA     Status: Normal   Collection Time   04/06/11 11:27 AM      Component Value Range   Lactic Acid, Venous 1.7  0.5 - 2.2 (mmol/L)  BASIC METABOLIC PANEL     Status: Abnormal   Collection Time   04/07/11  8:37 AM      Component Value Range   Sodium 134 (*) 135 - 145 (mEq/L)   Potassium 4.1  3.5 - 5.1 (mEq/L)   Chloride 102  96 - 112 (mEq/L)   CO2 28  19 - 32 (mEq/L)   Glucose, Bld 146 (*) 70 - 99 (mg/dL)   BUN 13  6 - 23 (mg/dL)   Creatinine, Ser 6.21  0.50 - 1.35 (mg/dL)   Calcium 8.7  8.4 - 30.8 (mg/dL)   GFR calc non Af Amer >60  >60 (mL/min)   GFR calc Af Amer >60  >60 (mL/min)  CBC     Status: Abnormal   Collection Time   04/07/11  8:37 AM      Component Value Range   WBC 12.7 (*) 4.0 -  10.5 (K/uL)   RBC 2.76 (*) 4.22 - 5.81 (MIL/uL)   Hemoglobin 9.6 (*) 13.0 - 17.0 (g/dL)   HCT 65.7 (*) 84.6 - 52.0 (%)   MCV 100.4 (*) 78.0 - 100.0 (fL)   MCH 34.8 (*) 26.0 - 34.0 (pg)   MCHC 34.7  30.0 - 36.0 (g/dL)   RDW 96.2  95.2 - 84.1 (%)   Platelets 130 (*) 150 - 400 (K/uL)  DIFFERENTIAL     Status: Abnormal   Collection Time   04/07/11  8:37 AM      Component Value Range   Neutrophils Relative 85 (*) 43 - 77 (%)   Neutro Abs 10.7 (*) 1.7 - 7.7 (K/uL)   Lymphocytes Relative 5 (*) 12 - 46 (%)   Lymphs Abs 0.6 (*) 0.7 - 4.0 (K/uL)   Monocytes Relative 10  3 - 12 (%)   Monocytes Absolute 1.3 (*) 0.1 - 1.0 (K/uL)   Eosinophils Relative 0  0 - 5 (%)   Eosinophils Absolute 0.0  0.0 - 0.7 (K/uL)   Basophils Relative 0  0 - 1 (%)   Basophils Absolute 0.0  0.0 - 0.1 (K/uL)  Medications: I have reviewed the patient's current medications.  Impression: 1. Small bowel enteritis with bowel obstruction. 2. Anemia.      Plan: 1. Continue intravenous fluids and intravenous antibiotics. 2. Agree with nasogastric tube. 3. Repeat abdominal x-rays tomorrow. 4. Monitor hemoglobin. It is trending down from admission and it may just reflect rehydration.     LOS: 3 days   Shealeigh Dunstan C 04/07/2011, 10:01 AM

## 2011-04-07 NOTE — Progress Notes (Signed)
Subjective: Patient does not feel much better; he was seen by me this morning and agreed to have NG tube. He states his abdomen is tight but pain may be slightly decreased. He still has not passed feces or flatus.  Objective: Vital signs in last 24 hours: Temp:  [98.3 F (36.8 C)-99.1 F (37.3 C)] 98.3 F (36.8 C) (07/19 0519) Pulse Rate:  [97-103] 102  (07/19 0519) Resp:  [16-18] 18  (07/19 0519) BP: (120-132)/(68-73) 132/73 mmHg (07/19 0519) SpO2:  [91 %-93 %] 92 % (07/19 0519) Weight:  [73.8 kg (162 lb 11.2 oz)] 162 lb 11.2 oz (73.8 kg) (07/18 2010) Lungs clear to auscultation. Abdomen remains distended with sporadic peristalsis. He has generalized tenderness which is most pronounced at right mid-abdomen with gaurdin  Assessment/Plan: Acute illness most likely secondary to enteritis.  He has had NG tube for 8 hours but so far without significant improvement. If he does not improve in th next 24 hours will to consider  Repeat CT scan and will Discuss condition with Dr. Lovell Sheehan. Will continue with current treatment plan.   REHMAN,NAJEEB U 04/07/2011, 6:16 PM

## 2011-04-07 NOTE — Consult Note (Signed)
  Subjective: *Patient states he has had no significant change since I last saw him yesterday. No significant bowel movement. No worsening of abdominal pain. No emesis.**  Objective: Vital signs in last 24 hours: Temp:  [98.3 F (36.8 C)-99.1 F (37.3 C)] 98.3 F (36.8 C) (07/19 0519) Pulse Rate:  [97-103] 102  (07/19 0519) Resp:  [16-18] 18  (07/19 0519) BP: (120-132)/(68-73) 132/73 mmHg (07/19 0519) SpO2:  [91 %-93 %] 92 % (07/19 0519) Weight:  [73.8 kg (162 lb 11.2 oz)] 162 lb 11.2 oz (73.8 kg) (07/18 2010) Last BM Date: 04/04/11  Intake/Output from previous day: 07/18 0701 - 07/19 0700 In: 2757 [P.O.:880; I.V.:1276; IV Piggyback:600] Out: 450 [Urine:450] Intake/Output this shift:    General appearance: alert and cooperative Abdomen: Distended but soft. No specific tenderness. No rigidity noted.  Lab Results:   Basename 04/06/11 0542 04/04/11 1611  WBC 15.8* 10.9*  HGB 11.6* 15.6  HCT 33.9* 44.4  PLT 142* 189   BMET  Basename 04/06/11 0542 04/05/11 0552  NA 137 137  K 4.3 5.2*  CL 103 103  CO2 26 26  GLUCOSE 145* 244*  BUN 19 20  CREATININE 0.73 1.21  CALCIUM 8.7 8.9   Anti-infectives: Anti-infectives     Start     Dose/Rate Route Frequency Ordered Stop   04/05/11 1200   Ampicillin-Sulbactam (UNASYN) 3 g in sodium chloride 0.9 % 100 mL IVPB  Status:  Discontinued        3 g 100 mL/hr over 60 Minutes Intravenous Every 6 hours 04/05/11 1144 04/06/11 1718   04/04/11 1800   Ampicillin-Sulbactam (UNASYN) 3 g in sodium chloride 0.9 % 100 mL IVPB  Status:  Discontinued        3 g 100 mL/hr over 60 Minutes Intravenous Every 6 hours 04/04/11 1755 04/05/11 1144         Dr. Patty Sermons note reviewed. Assessment/Plan: Ileitis with small bowel obstruction.  No need for operative intervention at this time. Patient has agreed to NG tube placement. Will continue to follow patient with you.  LOS: 3 days    Morganna Styles A 04/07/2011

## 2011-04-07 NOTE — Consult Note (Signed)
Randy Le, Randy Le NO.:  [id number]  MEDICAL RECORD NO.:  [id number]  LOCATION:  [id number]                          FACILITY:  [facility]  PHYSICIAN:  Lionel December, M.D.    DATE OF BIRTH:  22-Mar-1956  DATE OF CONSULTATION:  04/06/2011 DATE OF DISCHARGE:                                CONSULTATION   REASON FOR CONSULTATION:  Patient with acute enteritis and persistent abdominal pain.  HISTORY OF PRESENT ILLNESS:  Randy Le is 55 year old Caucasian male patient of Dr. Karleen Hampshire of [facility], who is admitted to Hospitalist Service 2 days ago when he presented with acute onset of abdominal pain and nausea.  The patient states that on the morning of April 04, 2011, when he woke up, he was fine on his way to work, he stopped at [employer] and bought to sausage burritos and he ate one in the parking lot and he ate the second one around 10 a.m. and within 30 minutes or so he felt queasy in his abdomen and felt nauseated.  Several minutes later, he noted severe pain across his abdomen and he could not bend to do his work.  He also experienced nausea, he thought he had fever, but did not check it.  He decided to drive home.  However, by lunchtime he was having excruciating cramps and he became diaphoretic and had chills.  He called 911, and was brought to emergency room.  He was evaluated in the emergency room and had abdominopelvic without contrast which showed dilated loops of small bowel which were fluid-filled thickening the wall.  It also showed atherosclerotic changes to aorta and the branching vessels as well as edema to mesentery, next to these abnormal loops as well as ascites with perihepatic fluid as well as fluid in the pelvic cavity and right paracolic gutter.  The patient was hospitalized.  He was begun on IV fluids and made n.p.o..  He was also begun on Unasyn and analgesics and antiemetic.  Yesterday, he thought he was feeling better and no sooner did  he drink clear liquids.  He developed abdominal pain and distention. It got worse after he took clear liquids in the evening.  He is now n.p.o.  He has not passed flatus or had a bowel movement since admission.  He did have 2 bowel movements prior to admission and there were formed small in volume.  He has been extremely nauseated, however, he has not vomited.  He also has difficulty voiding because it hurts to strain.  There is no history of prior abdominal pain, frequent heartburn, diarrhea, constipation, melena, or rectal bleeding.  He has not experienced any skin rash or headache or sore throat.  He does swim in a lake every now and then.  He was at [facility] recently and swam and does not recall that he had any problems.  REVIEW OF THE SYSTEMS:  Positive for 30-pound weight loss during later part of last year which he attributes to change in his job and he states it is very physical work, feels like he is in a gym for 8 hours.  He states he has had a very good appetite, though.  CURRENT MEDICATIONS: 1. Dilaudid 2 mg IV q.4 p.r.n. 2. Zofran 4 mg IV q.6 p.r.n. 3. Unasyn 3 g IV q.6. 4. He is also on normal saline at a rate of 125 mL per hour.  At home, he has been on metoprolol and multivitamin.  PAST MEDICAL HISTORY:  He had right inguinal herniorrhaphy as an infant. He has been hypertensive for 60 years.  He had injury to his left wrist in 2000, and had three surgeries and he had three bones removed.  He states he has 40% of range of motion in left and he also has some weakness in his hands.  Other surgeries include removal of cyst from his back several years ago and had left tympanoplasty in 2004.  Over the years, he has had his teeth pulled and now has dentures.  He had colonoscopy in July 2007, by Dr. Jena Gauss, for screening purposes, it was normal other than internal hemorrhoids.  ALLERGIES:  NK.  FAMILY HISTORY:  Father had dementia and died at age 3, mother is 72  in good health.  He has a brother age 68 in good health.  SOCIAL HISTORY:  He is married, but does not have any children.  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  He has been smoking anywhere from half to two packs per day for the last 30 years and now trying to quit and down to half a pack a day.  He drinks beer usually 2-4 cans over the weekend but not daily.  OBJECTIVE:  VITAL SIGNS:  Admission weight 73.8 kg, he is 68 inches tall, pulse 99 per minute, blood pressure 122/72, temp is 98.8, and respiratory rate 16. HEENT:  Conjunctivae are pink.  Sclerae nonicteric.  Oropharyngeal mucosa is normal.  He has upper and lower dentures in place.  No neck masses or thyromegaly noted. CARDIAC:  With regular rhythm.  Normal S1 and S2.  No murmur or gallop noted. LUNGS:  Clear to auscultation. ABDOMEN:  Full and symmetrical.  Bowel sounds are absent.  No bruits noted.  On palpation, soft abdomen with generalized tenderness which is most pronounced in the right mid and lower quadrant.  He has guarding, but no rebound noted.  No organomegaly or masses. RECTAL:  Deferred. EXTREMITIES:  He does not have peripheral edema or clubbing.  LABORATORY DATA:  From admission, WBC 10.9, H and H 15.6 and 44.4, platelet count 189,000.  Sodium 137, potassium 3.6, chloride 98, CO2 of 27, glucose 127, BUN 11, creatinine 0.81, calcium 10.2, bilirubin 4.6, AP 80, SGOT 21, SGPT 14, total protein 7.6 with albumin of 4.1.  Calcium was 4.1.  His urinalysis revealed trace ketones.  Serum amylase was 67 and lipase was 31.  Abdominopelvic CT performed without contrast.  I reviewed the study with Dr. Ulyses Southward.  He has dilated loops of small bowel in the right mid abdomen, which are fluid filled and wall is thickened.  He has scant amount of ascites in the pelvic region, right paracolic gutter, and perihepatic region.  He also has edema to  the mesentery.  No evidence of pneumoperitoneum or gas in the portal vein. There are extensive atherosclerotic changes to aorta, particularly to the takeoff SMA with dense calcification and it is at least 50% narrowed, however, celiac artery appears to be patent.  He had chest and abdominal films today.  He had dilated loops of small bowel in upper and  midabdomen to the left unlike on the CAT scan.  Bowel wall thickening once again noted.  Not mentioned above, his abdominal CT revealed subcentimeter nodular densities in the right middle lobe and both lower lobes and followup recommended.  Lab data from this morning, WBC 15.8, H and H is 11.6 and 33.9, platelet count is 142,000.  His glucose was 145.  Electrolytes were normal.  BUN 19, creatinine 0.73.  He also had lactic acid level, this morning it was 1.7 mmol/L which is within normal limits.  ASSESSMENT:  Arsh is 55 year old Caucasian male who presents with acute onset of abdominal pain associated with nausea and he has not had a bowel movement or passed the flatus in 2 days.  Abdominopelvic CT revealed dilated loops of ileum with thickening to the wall as well as scant amount of ascites.  No evidence of pneumoperitoneum.  He has extensive calcification to take off SMA, but celiac trunk is patent.  He has maintained his renal function and he does not appear to have acidosis, therefore ischemic/vascular injury is unlikely as I would expect deterioration or progression of his illness.  I suspect we dealing with either toxic enteropathy or an infectious enteritis.  He does swim [place] a lake and therefore that could be a potential source as would be his eating a sausage burritos prior to onset of his symptoms.  He could also have small bowel obstruction due to adhesions.  He rarely would benefit from NG tube placement which has been discussed with him but both by Dr. Lendell Caprice and Dr. Lovell Sheehan and he has been reluctant at this  point.  The patient is currently on Unasyn and it would not cover commonly encountered pathogens which can cause enteritis i.e. Campylobacter, Yersinia, therefore due to change in Cipro and metronidazole.  RECOMMENDATIONS: 1. The patient advised to consider NG tube for decompression, it     may provide immediate relief to his acute symptoms and may even     reverse the process.  He wants to think about it; says that if pain     gets worse and he would agree. 2. Discontinue Unasyn. 3. We will start him on Cipro 400 mg IV q.12 hours and metronidazole 5     mg IV q.6 hours. 4. The patient will be reevaluated in a.m.  We appreciate the opportunity to participate in the care of this gentleman.          ______________________________ Lionel December, M.D.     NR/MEDQ  D:  04/06/2011  T:  04/07/2011  Job:  [id number]

## 2011-04-08 ENCOUNTER — Other Ambulatory Visit: Payer: Self-pay | Admitting: General Surgery

## 2011-04-08 ENCOUNTER — Encounter (HOSPITAL_COMMUNITY)
Admission: EM | Disposition: A | Discharge status: Home / Self Care | Payer: Self-pay | Source: Home / Self Care | Attending: General Surgery

## 2011-04-08 ENCOUNTER — Encounter (HOSPITAL_COMMUNITY): Payer: Self-pay | Admitting: Anesthesiology

## 2011-04-08 ENCOUNTER — Inpatient Hospital Stay (HOSPITAL_COMMUNITY): Payer: BC Managed Care – PPO

## 2011-04-08 ENCOUNTER — Encounter (HOSPITAL_COMMUNITY): Payer: Self-pay | Admitting: *Deleted

## 2011-04-08 ENCOUNTER — Inpatient Hospital Stay (HOSPITAL_COMMUNITY): Payer: BC Managed Care – PPO | Admitting: Anesthesiology

## 2011-04-08 ENCOUNTER — Other Ambulatory Visit: Payer: Self-pay

## 2011-04-08 HISTORY — PX: LAPAROTOMY: SHX154

## 2011-04-08 LAB — CBC
HCT: 25.2 % — ABNORMAL LOW (ref 39.0–52.0)
HCT: 28.4 % — ABNORMAL LOW (ref 39.0–52.0)
Hemoglobin: 8.6 g/dL — ABNORMAL LOW (ref 13.0–17.0)
MCHC: 34.2 g/dL (ref 30.0–36.0)
MCV: 100 fL (ref 78.0–100.0)
MCV: 100 fL (ref 78.0–100.0)
RBC: 2.52 MIL/uL — ABNORMAL LOW (ref 4.22–5.81)
RDW: 13.1 % (ref 11.5–15.5)
WBC: 11.8 10*3/uL — ABNORMAL HIGH (ref 4.0–10.5)

## 2011-04-08 LAB — ABO/RH: ABO/RH(D): A POS

## 2011-04-08 LAB — DIFFERENTIAL
Basophils Absolute: 0 10*3/uL (ref 0.0–0.1)
Basophils Relative: 0 % (ref 0–1)
Eosinophils Relative: 0 % (ref 0–5)
Lymphocytes Relative: 8 % — ABNORMAL LOW (ref 12–46)
Monocytes Absolute: 1.3 10*3/uL — ABNORMAL HIGH (ref 0.1–1.0)
Monocytes Relative: 11 % (ref 3–12)

## 2011-04-08 LAB — BASIC METABOLIC PANEL
BUN: 14 mg/dL (ref 6–23)
CO2: 28 mEq/L (ref 19–32)
Chloride: 104 mEq/L (ref 96–112)
Creatinine, Ser: 0.65 mg/dL (ref 0.50–1.35)

## 2011-04-08 LAB — PREPARE RBC (CROSSMATCH)

## 2011-04-08 SURGERY — LAPAROTOMY, EXPLORATORY
Anesthesia: General | Site: Abdomen | Wound class: Clean Contaminated

## 2011-04-08 MED ORDER — SODIUM CHLORIDE 0.9 % IR SOLN
Status: DC | PRN
  Administered 2011-04-08: 1000 mL

## 2011-04-08 MED ORDER — PROPOFOL 10 MG/ML IV EMUL
INTRAVENOUS | Status: DC | PRN
  Administered 2011-04-08: 150 mg via INTRAVENOUS

## 2011-04-08 MED ORDER — HYDROMORPHONE HCL 2 MG/ML IJ SOLN
INTRAMUSCULAR | Status: AC
  Administered 2011-04-08: 0.5 mg via INTRAVENOUS
  Filled 2011-04-08: qty 1

## 2011-04-08 MED ORDER — PANTOPRAZOLE SODIUM 40 MG IV SOLR
40.0000 mg | INTRAVENOUS | Status: DC
  Administered 2011-04-08 – 2011-04-13 (×6): 40 mg via INTRAVENOUS
  Filled 2011-04-08 (×6): qty 40

## 2011-04-08 MED ORDER — ONDANSETRON HCL 4 MG/2ML IJ SOLN: 4.0000 mg | Freq: Once | INTRAMUSCULAR | Status: AC | PRN

## 2011-04-08 MED ORDER — ENOXAPARIN SODIUM 40 MG/0.4ML ~~LOC~~ SOLN
40.0000 mg | SUBCUTANEOUS | Status: DC
  Administered 2011-04-09 – 2011-04-15 (×7): 40 mg via SUBCUTANEOUS
  Filled 2011-04-08 (×7): qty 0.4

## 2011-04-08 MED ORDER — SUCCINYLCHOLINE CHLORIDE 20 MG/ML IJ SOLN
INTRAMUSCULAR | Status: AC
  Filled 2011-04-08: qty 1

## 2011-04-08 MED ORDER — SUCCINYLCHOLINE CHLORIDE 20 MG/ML IJ SOLN
INTRAMUSCULAR | Status: DC | PRN
  Administered 2011-04-08: 120 mg via INTRAVENOUS

## 2011-04-08 MED ORDER — HYDROMORPHONE HCL 1 MG/ML IJ SOLN
2.0000 mg | INTRAMUSCULAR | Status: DC | PRN
  Administered 2011-04-08: 2 mg via INTRAVENOUS
  Administered 2011-04-08: 1 mg via INTRAVENOUS
  Administered 2011-04-08 (×2): 0.5 mg via INTRAVENOUS
  Administered 2011-04-08 – 2011-04-13 (×24): 2 mg via INTRAVENOUS
  Filled 2011-04-08 (×26): qty 2

## 2011-04-08 MED ORDER — LACTATED RINGERS IV SOLN
INTRAVENOUS | Status: DC
  Administered 2011-04-08 (×2): via INTRAVENOUS

## 2011-04-08 MED ORDER — ONDANSETRON HCL 4 MG PO TABS
4.0000 mg | ORAL_TABLET | Freq: Four times a day (QID) | ORAL | Status: DC | PRN
  Administered 2011-04-14: 4 mg via ORAL
  Filled 2011-04-08: qty 1

## 2011-04-08 MED ORDER — ONDANSETRON HCL 4 MG/2ML IJ SOLN: 4.0000 mg | Freq: Four times a day (QID) | INTRAMUSCULAR | Status: DC | PRN

## 2011-04-08 MED ORDER — ENOXAPARIN SODIUM 40 MG/0.4ML ~~LOC~~ SOLN
40.0000 mg | Freq: Once | SUBCUTANEOUS | Status: AC
  Administered 2011-04-08: 40 mg via SUBCUTANEOUS

## 2011-04-08 MED ORDER — SODIUM CHLORIDE 0.9 % IJ SOLN
INTRAMUSCULAR | Status: AC
  Administered 2011-04-08: 10 mL
  Filled 2011-04-08: qty 10

## 2011-04-08 MED ORDER — FENTANYL CITRATE 0.05 MG/ML IJ SOLN: 25.0000 ug | INTRAMUSCULAR | Status: DC | PRN

## 2011-04-08 MED ORDER — METOPROLOL TARTRATE 1 MG/ML IV SOLN
INTRAVENOUS | Status: AC
  Administered 2011-04-08: 5 mg via INTRAVENOUS
  Filled 2011-04-08: qty 5

## 2011-04-08 MED ORDER — CIPROFLOXACIN IN D5W 400 MG/200ML IV SOLN
400.0000 mg | Freq: Two times a day (BID) | INTRAVENOUS | Status: AC
  Administered 2011-04-08: 400 mg via INTRAVENOUS
  Filled 2011-04-08: qty 200

## 2011-04-08 MED ORDER — ROCURONIUM BROMIDE 100 MG/10ML IV SOLN
INTRAVENOUS | Status: DC | PRN
  Administered 2011-04-08: 5 mg via INTRAVENOUS
  Administered 2011-04-08: 25 mg via INTRAVENOUS

## 2011-04-08 MED ORDER — METRONIDAZOLE IN NACL 5-0.79 MG/ML-% IV SOLN
INTRAVENOUS | Status: AC
  Filled 2011-04-08: qty 100

## 2011-04-08 MED ORDER — POVIDONE-IODINE 10 % EX OINT
TOPICAL_OINTMENT | CUTANEOUS | Status: AC
  Filled 2011-04-08: qty 2

## 2011-04-08 MED ORDER — ACETAMINOPHEN 10 MG/ML IV SOLN
1000.0000 mg | Freq: Four times a day (QID) | INTRAVENOUS | Status: AC
  Administered 2011-04-08 – 2011-04-09 (×4): 1000 mg via INTRAVENOUS
  Filled 2011-04-08 (×4): qty 100

## 2011-04-08 MED ORDER — MIDAZOLAM HCL 5 MG/5ML IJ SOLN
INTRAMUSCULAR | Status: DC | PRN
  Administered 2011-04-08: 2 mg via INTRAVENOUS

## 2011-04-08 MED ORDER — ACETAMINOPHEN 10 MG/ML IV SOLN
INTRAVENOUS | Status: AC
  Administered 2011-04-08: 1000 mg via INTRAVENOUS
  Filled 2011-04-08: qty 100

## 2011-04-08 MED ORDER — GLYCOPYRROLATE 0.2 MG/ML IJ SOLN
INTRAMUSCULAR | Status: DC | PRN
  Administered 2011-04-08: .4 mg via INTRAVENOUS

## 2011-04-08 MED ORDER — ENOXAPARIN SODIUM 40 MG/0.4ML ~~LOC~~ SOLN
SUBCUTANEOUS | Status: AC
  Administered 2011-04-08: 40 mg via SUBCUTANEOUS
  Filled 2011-04-08: qty 0.4

## 2011-04-08 MED ORDER — NEOSTIGMINE METHYLSULFATE 1 MG/ML IJ SOLN
INTRAMUSCULAR | Status: DC | PRN
  Administered 2011-04-08: 2.5 mg via INTRAMUSCULAR

## 2011-04-08 MED ORDER — FENTANYL CITRATE 0.05 MG/ML IJ SOLN
INTRAMUSCULAR | Status: DC | PRN
  Administered 2011-04-08 (×7): 50 ug via INTRAVENOUS

## 2011-04-08 MED ORDER — POVIDONE-IODINE 10 % EX OINT
TOPICAL_OINTMENT | CUTANEOUS | Status: DC | PRN
  Administered 2011-04-08: 2 via TOPICAL

## 2011-04-08 MED ORDER — ACETAMINOPHEN 325 MG PO TABS: 325.0000 mg | ORAL_TABLET | ORAL | Status: DC | PRN

## 2011-04-08 SURGICAL SUPPLY — 66 items
APPLIER CLIP 11 MED OPEN (CLIP)
APPLIER CLIP 13 LRG OPEN (CLIP)
APR CLP LRG 13 20 CLIP (CLIP)
APR CLP MED 11 20 MLT OPN (CLIP)
BAG HAMPER (MISCELLANEOUS) ×2 IMPLANT
BARRIER SKIN 2 3/4 (OSTOMY) IMPLANT
BARRIER SKIN OD2.25 2 3/4 FLNG (OSTOMY) IMPLANT
BRR SKN FLT 2.75X2.25 2 PC (OSTOMY)
CLAMP POUCH DRAINAGE QUIET (OSTOMY) IMPLANT
CLIP APPLIE 11 MED OPEN (CLIP) IMPLANT
CLIP APPLIE 13 LRG OPEN (CLIP) IMPLANT
CLOTH BEACON ORANGE TIMEOUT ST (SAFETY) ×2 IMPLANT
COVER LIGHT HANDLE STERIS (MISCELLANEOUS) ×4 IMPLANT
DRAPE WARM FLUID 44X44 (DRAPE) ×1 IMPLANT
DURAPREP 26ML APPLICATOR (WOUND CARE) ×2 IMPLANT
ELECT BLADE 6 FLAT ULTRCLN (ELECTRODE) IMPLANT
ELECT REM PT RETURN 9FT ADLT (ELECTROSURGICAL) ×2
ELECTRODE REM PT RTRN 9FT ADLT (ELECTROSURGICAL) ×1 IMPLANT
GLOVE BIO SURGEON STRL SZ7.5 (GLOVE) ×4 IMPLANT
GLOVE ECLIPSE 6.5 STRL STRAW (GLOVE) ×2 IMPLANT
GLOVE ECLIPSE 7.0 STRL STRAW (GLOVE) ×2 IMPLANT
GLOVE ECLIPSE 8.0 STRL XLNG CF (GLOVE) ×2 IMPLANT
GLOVE EXAM NITRILE MD LF STRL (GLOVE) ×1 IMPLANT
GLOVE INDICATOR 7.0 STRL GRN (GLOVE) ×2 IMPLANT
GLOVE INDICATOR 7.5 STRL GRN (GLOVE) ×2 IMPLANT
GLOVE INDICATOR 8.5 STRL (GLOVE) ×1 IMPLANT
GOWN BRE IMP SLV AUR XL STRL (GOWN DISPOSABLE) ×6 IMPLANT
GOWN STRL REIN 3XL LVL4 (GOWN DISPOSABLE) ×1 IMPLANT
HARMONIC SHEARS 14CM COAG (MISCELLANEOUS) IMPLANT
INST SET MAJOR GENERAL (KITS) ×2 IMPLANT
KIT REMOVER STAPLE SKIN (MISCELLANEOUS) IMPLANT
KIT ROOM TURNOVER APOR (KITS) ×2 IMPLANT
LIGASURE IMPACT 36 18CM CVD LR (INSTRUMENTS) ×1 IMPLANT
MANIFOLD NEPTUNE II (INSTRUMENTS) ×2 IMPLANT
NS IRRIG 1000ML POUR BTL (IV SOLUTION) ×2 IMPLANT
PACK ABDOMINAL MAJOR (CUSTOM PROCEDURE TRAY) ×2 IMPLANT
PAD ARMBOARD 7.5X6 YLW CONV (MISCELLANEOUS) ×2 IMPLANT
POUCH OSTOMY 2 3/4  H 3804 (WOUND CARE)
POUCH OSTOMY 2 3/4 H 3804 (WOUND CARE)
POUCH OSTOMY 2 PC DRNBL 2.75 (WOUND CARE) IMPLANT
RELOAD LINEAR CUT PROX 55 BLUE (ENDOMECHANICALS) IMPLANT
RELOAD PROXIMATE 75MM BLUE (ENDOMECHANICALS) ×4 IMPLANT
RELOAD STAPLE 55 3.8 BLU REG (ENDOMECHANICALS) IMPLANT
RELOAD STAPLE 75 3.8 BLU REG (ENDOMECHANICALS) IMPLANT
RETRACTOR WND ALEXIS 25 LRG (MISCELLANEOUS) IMPLANT
RETRACTOR WOUND ALXS 34CM XLRG (MISCELLANEOUS) IMPLANT
RTRCTR WOUND ALEXIS 25CM LRG (MISCELLANEOUS)
RTRCTR WOUND ALEXIS 34CM XLRG (MISCELLANEOUS)
SET BASIN LINEN APH (SET/KITS/TRAYS/PACK) ×2 IMPLANT
SHEARS HARMONIC 23CM COAG (MISCELLANEOUS) IMPLANT
SPONGE GAUZE 4X4 12PLY (GAUZE/BANDAGES/DRESSINGS) ×2 IMPLANT
SPONGE LAP 18X18 X RAY DECT (DISPOSABLE) ×4 IMPLANT
STAPLER GUN LINEAR PROX 60 (STAPLE) ×1 IMPLANT
STAPLER PROXIMATE 55 BLUE (STAPLE) IMPLANT
STAPLER PROXIMATE 75MM BLUE (STAPLE) ×1 IMPLANT
STAPLER VISISTAT (STAPLE) ×2 IMPLANT
SUCTION POOLE TIP (SUCTIONS) ×2 IMPLANT
SUT CHROMIC 0 SH (SUTURE) ×1 IMPLANT
SUT CHROMIC 2 0 SH (SUTURE) ×2 IMPLANT
SUT NOVA NAB GS-26 0 60 (SUTURE) ×3 IMPLANT
SUT SILK 2 0 (SUTURE) ×2
SUT SILK 2 0 REEL (SUTURE) ×1 IMPLANT
SUT SILK 2-0 18XBRD TIE 12 (SUTURE) ×1 IMPLANT
SUT SILK 3 0 SH CR/8 (SUTURE) ×3 IMPLANT
TOWEL BLUE STERILE X RAY DET (MISCELLANEOUS) ×1 IMPLANT
TRAY FOLEY CATH 14FR (SET/KITS/TRAYS/PACK) ×2 IMPLANT

## 2011-04-08 NOTE — Progress Notes (Addendum)
Discussed with Drs. Lovell Sheehan and Karilyn Cota.  Pt on his way to OR.  Subjective: No new  Objective: Vital signs in last 24 hours: Temp:  [98.2 F (36.8 C)-98.3 F (36.8 C)] 98.3 F (36.8 C) (07/20 0600) Pulse Rate:  [86-106] 86  (07/20 0629) Resp:  [19-22] 22  (07/20 0600) BP: (120-163)/(61-80) 120/61 mmHg (07/20 0629) SpO2:  [91 %-95 %] 95 % (07/20 0600) Weight change:  Last BM Date: 04/08/11  Intake/Output from previous day: 07/19 0701 - 07/20 0700 In: 3755.6 [I.V.:2853.6; NG/GT:100; IV Piggyback:800] Out: 3301 [Urine:750; Emesis/NG output:2550; Stool:1] Intake/Output this shift:    General appearance: Calm. Getting EKG Head: Normocephalic, without obvious abnormality, atraumatic, MMM, NG draining billious Resp: clear to auscultation bilaterally Cardio: regular rate and rhythm, S1, S2 normal, no murmur, click, rub or gallop GI:  BS present, distended tender. Extremities: extremities normal, atraumatic, no cyanosis or edema  Lab Results:  [facility] 04/08/11 0911 04/08/11 0454  WBC 11.4* 11.8*  HGB 9.7* 8.6*  HCT 28.4* 25.2*  PLT 162 145*   BMET  Basename 04/08/11 0454 04/07/11 0837  NA 137 134*  K 3.7 4.1  CL 104 102  CO2 28 28  GLUCOSE 126* 146*  BUN 14 13  CREATININE 0.65 0.67  CALCIUM 8.6 8.7    Studies/Results: No new  Assessment/Plan: Enteritis/SBO:  For ex-lap today. Anemia:  Worse.  Htn ok.   LOS: 4 days   Stancil Deisher L 04/08/2011, 10:54 AM

## 2011-04-08 NOTE — Op Note (Signed)
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 Franky Macho  Anesthesia: GETA  Indications: Patient is a 29 year white male who has been in the hospital for 4 days with worsening abdominal distention. Initially, he was admitted with the diagnosis of ileitis. In the had refused NG tube placement in the past, but it was placed yesterday. Despite 2.5 L of fluid aspirated, his abdominal films worsened today. He is a significantly distended. After discussion with Dr. Karilyn Cota of gastroenterology, it was elected to proceed with exploratory laparotomy. The risks and benefits of the procedure including bleeding, infection, and a possibly blood transfusion were fully explained to the patient, gave informed consent.  Procedure note: Patient was placed in the supine position. After induction of general endotracheal anesthesia, the abdomen was prepped and draped using the usual sterile technique with DuraPrep. Surgical site confirmation was performed.  A midline incision was made into the peritoneal cavity without difficulty serosanguinous fluid with that was noted within the abdominal cavity. On exploration, the patient was noted to have a single omental band across the mesentery of the distal small bowel, causing a closed-loop obstruction. Approximately 3 feet of ileum was infarcted. Once the closed-loop obstruction with was reduced, the bowel was inspected for viability. A portion of the distal subsegments did have return of peristalsis and blood flow. It was elected to proceed with a partial small bowel resection. A GIA 70 stapler was placed proximally and distally around the infarcted portion of ileum. The mesentery was then divided using a LigaSure. The specimen was then removed from the operative field and sent to pathology for further  examination. A side to side enteroenterostomy was then performed using a GIA 70 stapler. The enterotomy was closed using a TA 60 stapler. The stapler was posterior using 3-0 silk sutures. The mesenteric defect was closed using a 2-0 chromic gut running suture. The proximal bowel was then milked up to the nasogastric tube which was in appropriate position in the stomach.  This helped facilitate closure of the peritoneum. The bowel was returned into the abdominal cavity in order fashion to was then copiously irrigated with warm normal saline.  The fascia was reapproximated in the Novafil running suture. Subcutaneous layer was irrigated normal saline and the skin was closed using staples. Betadine ointment dry sterile dressings were applied.  All tape and needle counts were correct the end of the procedure. Patient was extubated in the operating room and went back to recovery room awake in stable condition.  Complications: None  Specimen: Ileum  Blood loss: 50 cc

## 2011-04-08 NOTE — Anesthesia Postprocedure Evaluation (Signed)
  Anesthesia Post-op Note  Patient: Randy Le  Procedure(s) Performed:  EXPLORATORY LAPAROTOMY - Exploratory Laparotomy with Partial Small Bowel Resection  Patient Location: PACU  Anesthesia Type: General  Level of Consciousness: awake, alert , oriented and patient cooperative  Airway and Oxygen Therapy: Patient Spontanous Breathing  Post-op Pain: moderate  Post-op Assessment: Post-op Vital signs reviewed, Patient's Cardiovascular Status Stable, Respiratory Function Stable, Patent Airway, No signs of Nausea or vomiting and Pain level controlled  Post-op Vital Signs: stable  Complications: No apparent anesthesia complications

## 2011-04-08 NOTE — Progress Notes (Signed)
Subjective: Patient states he had BM last night and this morning but he has not passed flatus; He states his abdomen is still tight and pain is no less than yesterday. No melena or rectal bleeding.  No coffee grounds reported.  Objective: Vital signs in last 24 hours: Temp:  [98.2 F (36.8 C)-98.3 F (36.8 C)] 98.3 F (36.8 C) (07/20 0600) Pulse Rate:  [86-106] 86  (07/20 0629) Resp:  [19-22] 22  (07/20 0600) BP: (120-163)/(61-80) 120/61 mmHg (07/20 0629) SpO2:  [91 %-95 %] 95 % (07/20 0600) Weight change:  Last BM Date: 04/08/11  Intake/Output from previous day: 07/19 0701 - 07/20 0700 In: 3755.6 [I.V.:2853.6; NG/GT:100; IV Piggyback:800] Out: 3301 [Urine:750; Emesis/NG output:2550; Stool:1]  PHYSICAL EXAM General appearance: alert and no distress Abdomen is distended; bowel sounds active; On palpation it is tense with generalized tenderness; no rebound.  Lab Results:  [facility] 04/08/11 0911 04/08/11 0454  WBC 11.4* 11.8*  HGB 9.7* 8.6*  HCT 28.4* 25.2*  PLT 162 145*   BMET  Basename 04/08/11 0454 04/07/11 0837  NA 137 134*  K 3.7 4.1  CL 104 102  CO2 28 28  GLUCOSE 126* 146*  BUN 14 13  CREATININE 0.65 0.67  CALCIUM 8.6 8.7    Studies/Results: AAS reviewed with Dr. Tyron Russell and Dr. Lovell Sheehan. Markedly dilated loops of SB; no free air noted.   Assesment: Imaging study suggest small bowel obstruction; etiology not clear but he has not responded to NG suction Which he has had for 24 hours. His H/H has been steadily dropping; without evidence of overt GI bleed He must have intraperitoneal or intramural bleed. Discussed with Dr. Lovell Sheehan and Dr. Lendell Caprice. Patient will undergo laparotomy later today.        LOS: 4 days   Tomeika Weinmann U 04/08/2011, 9:49 AM

## 2011-04-08 NOTE — Anesthesia Procedure Notes (Signed)
Procedure Name: Intubation Performed by: Marylene Buerger Pre-anesthesia Checklist: Patient identified, Patient being monitored, Emergency Drugs available and Suction available Patient Re-evaluated:Patient Re-evaluated prior to inductionOxygen Delivery Method: Circle System Utilized Preoxygenation: Pre-oxygenation with 100% oxygen Intubation Type: IV induction, Rapid sequence and Circoid Pressure applied Laryngoscope Size: Mac and 3 Grade View: Grade I Tube type: Oral Tube size: 8.0 mm Number of attempts: 1 Placement Confirmation: ETT inserted through vocal cords under direct vision,  breath sounds checked- equal and bilateral and positive ETCO2 Secured at: 22 cm Tube secured with: Tape Dental Injury: Teeth and Oropharynx as per pre-operative assessment

## 2011-04-08 NOTE — Progress Notes (Signed)
  Subjective: Feels a little bit better with his nasogastric tube placement. Head 2.5 L out over the last 24 hours. Did have a watery bowel movement this morning.  Objective: Vital signs in last 24 hours: Temp:  [98.2 F (36.8 C)-98.3 F (36.8 C)] 98.3 F (36.8 C) (07/20 0600) Pulse Rate:  [86-106] 86  (07/20 0629) Resp:  [19-22] 22  (07/20 0600) BP: (120-163)/(61-80) 120/61 mmHg (07/20 0629) SpO2:  [91 %-95 %] 95 % (07/20 0600) Last BM Date: 04/08/11  Intake/Output from previous day: 07/19 0701 - 07/20 0700 In: 3755.6 [I.V.:2853.6; NG/GT:100; IV Piggyback:800] Out: 3301 [Urine:750; Emesis/NG output:2550; Stool:1] Intake/Output this shift:    General appearance: alert and cooperative Abdomen: Less distended, soft. No rigidity noted.  Lab Results:   Basename 04/08/11 0454 04/07/11 0837  WBC 11.8* 12.7*  HGB 8.6* 9.6*  HCT 25.2* 27.7*  PLT 145* 130*   BMET  Basename 04/08/11 0454 04/07/11 0837  NA 137 134*  K 3.7 4.1  CL 104 102  CO2 28 28  GLUCOSE 126* 146*  BUN 14 13  CREATININE 0.65 0.67  CALCIUM 8.6 8.7      Assessment/Plan: Partial small bowel obstruction secondary to ileitis Would continue nasogastric tube decompression for now. Will check KUB.  LOS: 4 days    [name] A 04/08/2011

## 2011-04-08 NOTE — Anesthesia Preprocedure Evaluation (Signed)
Anesthesia Evaluation  Name, MR# and DOB Patient awake  General Assessment Comment  Reviewed: Allergy & Precautions, H&P , Patient's Chart, lab work & pertinent test results and reviewed documented beta blocker date and time   Airway Mallampati: I TM Distance: >3 FB Neck ROM: Full    Dental  (+) Edentulous Upper and Edentulous Lower   Pulmonaryneg pulmonary ROS      pulmonary exam normal   Cardiovascular hypertension, Pt. on medications and Pt. on home beta blockers    Neuro/Psych (+) {AN ROS/MED HX NEURO HEADACHES Seizures - and Well Controlled, Negative Psych ROS  GI/Hepatic/Renal negative GI ROS, negative Liver ROS, and negative Renal ROS (+)       Endo/Other  Negative Endocrine ROS (+)   Abdominal (+)  Abdomen: tender. Bowel sounds: absent.  Musculoskeletal negative musculoskeletal ROS (+)  Hematology negative hematology ROS (+)   Peds  Reproductive/Obstetrics   Anesthesia Other Findings             Anesthesia Physical Anesthesia Plan  ASA: II  Anesthesia Plan: General   Post-op Pain Management:    Induction: Intravenous, Rapid sequence and Cricoid pressure planned  Airway Management Planned: Oral ETT  Additional Equipment:   Intra-op Plan:   Post-operative Plan: Extubation in OR  Informed Consent: I have reviewed the patients History and Physical, chart, labs and discussed the procedure including the risks, benefits and alternatives for the proposed anesthesia with the patient or authorized representative who has indicated his/her understanding and acceptance.     Plan Discussed with: CRNA  Anesthesia Plan Comments:         Anesthesia Quick Evaluation

## 2011-04-08 NOTE — Transfer of Care (Signed)
Immediate Anesthesia Transfer of Care Note  Patient: Randy Le  Procedure(s) Performed:  EXPLORATORY LAPAROTOMY - Exploratory Laparotomy with Partial Small Bowel Resection  Patient Location: PACU  Anesthesia Type: General  Level of Consciousness: awake, alert  and oriented  Airway & Oxygen Therapy: Patient Spontanous Breathing and Patient connected to face mask oxygen  Post-op Assessment: Report given to PACU RN and Post -op Vital signs reviewed and stable  Post vital signs: stable  Complications: No apparent anesthesia complications

## 2011-04-09 ENCOUNTER — Encounter (HOSPITAL_COMMUNITY): Payer: Self-pay | Admitting: Anesthesiology

## 2011-04-09 LAB — BASIC METABOLIC PANEL
Calcium: 8.2 mg/dL — ABNORMAL LOW (ref 8.4–10.5)
Creatinine, Ser: 0.65 mg/dL (ref 0.50–1.35)
GFR calc Af Amer: >60 mL/min (ref >60)

## 2011-04-09 LAB — CBC
Hemoglobin: 9 g/dL — ABNORMAL LOW (ref 13.0–17.0)
MCH: 34.4 pg — ABNORMAL HIGH (ref 26.0–34.0)
MCHC: 34.2 g/dL (ref 30.0–36.0)

## 2011-04-09 LAB — MAGNESIUM: Magnesium: 2.3 mg/dL (ref 1.5–2.5)

## 2011-04-09 MED ORDER — KCL IN DEXTROSE-NACL 40-5-0.45 MEQ/L-%-% IV SOLN
INTRAVENOUS | Status: DC
  Administered 2011-04-09 – 2011-04-11 (×5): via INTRAVENOUS
  Administered 2011-04-11: 75 mL/h via INTRAVENOUS
  Administered 2011-04-12 – 2011-04-14 (×3): via INTRAVENOUS

## 2011-04-09 NOTE — Progress Notes (Signed)
1 Day Post-Op  Subjective: Patient feels much better. He has less abdominal pain. He has had no bowel movement or flatus.  Objective: Vital signs in last 24 hours: Temp:  [97.6 F (36.4 C)-99.1 F (37.3 C)] 98.2 F (36.8 C) (07/21 0559) Pulse Rate:  [82-108] 86  (07/21 0559) Resp:  [12-29] 16  (07/21 0559) BP: (120-176)/(66-97) 120/66 mmHg (07/21 0559) SpO2:  [89 %-100 %] 93 % (07/21 0559) Last BM Date: 04/08/11  Intake/Output from previous day: 07/20 0701 - 07/21 0700 In: 4947.9 [I.V.:4547.9; NG/GT:100; IV Piggyback:300] Out: 3295 [Urine:595; Emesis/NG output:1750; Blood:50] Intake/Output this shift:    General appearance: alert and cooperative Resp: clear to auscultation bilaterally Cardio: regular rate and rhythm, S1, S2 normal, no murmur, click, rub or gallop Abdomen: Soft, less distended. Dressing dry and intact.  Lab Results:   [facility] 04/09/11 0517 04/08/11 0911  WBC 10.7* 11.4*  HGB 9.0* 9.7*  HCT 26.3* 28.4*  PLT 198 162   BMET  Basename 04/09/11 0517 04/08/11 0454  NA 140 137  K 3.1* 3.7  CL 106 104  CO2 30 28  GLUCOSE 98 126*  BUN 15 14  CREATININE 0.65 0.65  CALCIUM 8.2* 8.6   Anti-infectives: Anti-infectives     Start     Dose/Rate Route Frequency Ordered Stop   04/08/11 1615   ciprofloxacin (CIPRO) IVPB 400 mg        400 mg 200 mL/hr over 60 Minutes Intravenous Every 12 hours 04/08/11 1602 04/08/11 1815   04/06/11 1800   metroNIDAZOLE (FLAGYL) IVPB 500 mg  Status:  Discontinued        500 mg 100 mL/hr over 60 Minutes Intravenous 4 times per day 04/06/11 1718 04/08/11 1602   04/06/11 1730   ciprofloxacin (CIPRO) IVPB 400 mg  Status:  Discontinued        400 mg 200 mL/hr over 60 Minutes Intravenous Every 12 hours 04/06/11 1718 04/08/11 1616   04/05/11 1200   Ampicillin-Sulbactam (UNASYN) 3 g in sodium chloride 0.9 % 100 mL IVPB  Status:  Discontinued        3 g 100 mL/hr over 60 Minutes Intravenous Every 6 hours 04/05/11 1144 04/06/11  1718   04/04/11 1800   Ampicillin-Sulbactam (UNASYN) 3 g in sodium chloride 0.9 % 100 mL IVPB  Status:  Discontinued        3 g 100 mL/hr over 60 Minutes Intravenous Every 6 hours 04/04/11 1755 04/05/11 1144          Assessment/Plan: s/p Procedure(s): EXPLORATORY LAPAROTOMY, partial small bowel resection  The patient has been stable postoperatively. His problem list includes hypokalemia and anemia. The anemia is secondary to the infarction of the small bowel prior to surgery. His hemoglobin is stable, thus no blood transfusion is needed.  Plan: We'll leave an NG tube in for now. His Foley is to be DC'd. Will supplement his potassium. We'll monitor his hemoglobin.   LOS: 5 days    Ajaya Crutchfield A 04/09/2011

## 2011-04-09 NOTE — Anesthesia Postprocedure Evaluation (Signed)
  Anesthesia Post-op Note  Patient: Randy Le  Procedure(s) Performed:  EXPLORATORY LAPAROTOMY - Exploratory Laparotomy with Partial Small Bowel Resection  Patient Location: PACU   Patient in room 324.  Doing well today. No further follow up needed.  Anesthesia Type: General  Level of Consciousness: awake, alert  and oriented  Airway and Oxygen Therapy: Patient Spontanous Breathing  Post-op Pain: mild  Post-op Assessment: Post-op Vital signs reviewed, Patient's Cardiovascular Status Stable and Respiratory Function Stable  Post-op Vital Signs: stable  Complications: No apparent anesthesia complications

## 2011-04-10 MED ORDER — ACETAMINOPHEN 10 MG/ML IV SOLN
1000.0000 mg | Freq: Four times a day (QID) | INTRAVENOUS | Status: AC
  Administered 2011-04-10 – 2011-04-11 (×3): 1000 mg via INTRAVENOUS
  Filled 2011-04-10 (×4): qty 100

## 2011-04-10 MED ORDER — POTASSIUM CHLORIDE 10 MEQ/100ML IV SOLN
10.0000 meq | INTRAVENOUS | Status: AC
  Administered 2011-04-10 (×4): 10 meq via INTRAVENOUS
  Filled 2011-04-10: qty 400

## 2011-04-10 NOTE — Progress Notes (Signed)
2 Days Post-Op  Subjective: Patient feels a little better. He states he has the sensation to pass gas.  Objective: Vital signs in last 24 hours: Temp:  [97.6 F (36.4 C)-98.6 F (37 C)] 98.3 F (36.8 C) (07/22 0600) Pulse Rate:  [80-102] 87  (07/22 0600) Resp:  [19-24] 24  (07/22 0600) BP: (143-165)/(65-83) 144/69 mmHg (07/22 0600) SpO2:  [89 %-96 %] 90 % (07/22 0600) Last BM Date: 04/08/11  Intake/Output from previous day: 07/21 0701 - 07/22 0700 In: 1168.3 [I.V.:478.3; NG/GT:690] Out: 1650 [Emesis/NG output:1650] Intake/Output this shift:    Abdomen: Soft, mildly distended. Incision healing well. No drainage noted. No erythema noted.  Lab Results:  @LABLAST2 (wbc:2,hgb:2,hct:2,plt:2) BMET  Basename 04/09/11 0517 04/08/11 0454  NA 140 137  K 3.1* 3.7  CL 106 104  CO2 30 28  GLUCOSE 98 126*  BUN 15 14  CREATININE 0.65 0.65  CALCIUM 8.2* 8.6    Studies/Results: No results found.  Anti-infectives: Anti-infectives     Start     Dose/Rate Route Frequency Ordered Stop   04/08/11 1615   ciprofloxacin (CIPRO) IVPB 400 mg        400 mg 200 mL/hr over 60 Minutes Intravenous Every 12 hours 04/08/11 1602 04/08/11 1815   04/06/11 1800   metroNIDAZOLE (FLAGYL) IVPB 500 mg  Status:  Discontinued        500 mg 100 mL/hr over 60 Minutes Intravenous 4 times per day 04/06/11 1718 04/08/11 1602   04/06/11 1730   ciprofloxacin (CIPRO) IVPB 400 mg  Status:  Discontinued        400 mg 200 mL/hr over 60 Minutes Intravenous Every 12 hours 04/06/11 1718 04/08/11 1616   04/05/11 1200   Ampicillin-Sulbactam (UNASYN) 3 g in sodium chloride 0.9 % 100 mL IVPB  Status:  Discontinued        3 g 100 mL/hr over 60 Minutes Intravenous Every 6 hours 04/05/11 1144 04/06/11 1718   04/04/11 1800   Ampicillin-Sulbactam (UNASYN) 3 g in sodium chloride 0.9 % 100 mL IVPB  Status:  Discontinued        3 g 100 mL/hr over 60 Minutes Intravenous Every 6 hours 04/04/11 1755 04/05/11 1144           Assessment/Plan: s/p Procedure(s): EXPLORATORY LAPAROTOMY, partial small bowel resection Bowel return slowly. Will go ahead and DC NG tube. Supplemental potassium. Will check CBC, bmet in a.m.  LOS: 6 days    Randy Le A 04/10/2011

## 2011-04-10 NOTE — Progress Notes (Signed)
Pt concerned, wanting to be on a liquid diet.  Ambulating in halls frequently, voiding. No nausea. Belching, but has not passed gas.  Spoke with Dr. Lovell Sheehan, pt needs to pass gas prior to being placed on a full liquid diet (see advance diet order). Discussed with patient, verbalizes understanding.

## 2011-04-11 LAB — CBC
MCV: 98.6 fL (ref 78.0–100.0)
Platelets: 319 10*3/uL (ref 150–400)
RBC: 2.94 MIL/uL — ABNORMAL LOW (ref 4.22–5.81)
WBC: 8.5 10*3/uL (ref 4.0–10.5)

## 2011-04-11 LAB — BASIC METABOLIC PANEL
CO2: 29 mEq/L (ref 19–32)
Chloride: 102 mEq/L (ref 96–112)
Sodium: 136 mEq/L (ref 135–145)

## 2011-04-11 MED ORDER — SODIUM CHLORIDE 0.9 % IJ SOLN
INTRAMUSCULAR | Status: AC
  Administered 2011-04-11: 10 mL
  Filled 2011-04-11: qty 10

## 2011-04-11 MED ORDER — HYDROCODONE-ACETAMINOPHEN 5-325 MG PO TABS
1.0000 | ORAL_TABLET | ORAL | Status: DC | PRN
  Administered 2011-04-11 – 2011-04-12 (×2): 1 via ORAL
  Administered 2011-04-13: 2 via ORAL
  Filled 2011-04-11 (×3): qty 1

## 2011-04-11 MED ORDER — MAGNESIUM HYDROXIDE 400 MG/5ML PO SUSP
30.0000 mL | Freq: Four times a day (QID) | ORAL | Status: DC | PRN
  Administered 2011-04-11: 30 mL via ORAL
  Filled 2011-04-11: qty 30

## 2011-04-11 MED ORDER — SODIUM CHLORIDE 0.9 % IJ SOLN
INTRAMUSCULAR | Status: AC
  Administered 2011-04-11: 20:00:00
  Filled 2011-04-11: qty 10

## 2011-04-11 MED ORDER — SODIUM CHLORIDE 0.9 % IJ SOLN
INTRAMUSCULAR | Status: AC
  Administered 2011-04-11: 20:00:00
  Filled 2011-04-11: qty 3

## 2011-04-11 NOTE — Progress Notes (Signed)
3 Days Post-Op  Subjective: Minimal incisional pain. No nausea or vomiting. No bowel movement or flatus.  Objective: Vital signs in last 24 hours: Temp:  [97.7 F (36.5 C)-99.4 F (37.4 C)] 98.1 F (36.7 C) (07/23 0549) Pulse Rate:  [78-91] 91  (07/23 0549) Resp:  [16-24] 24  (07/23 0549) BP: (147-174)/(74-82) 148/74 mmHg (07/23 0549) SpO2:  [94 %-97 %] 94 % (07/23 0549) Last BM Date: 04/06/11 (per patient)  Intake/Output from previous day: 07/22 0701 - 07/23 0700 In: 3863.3 [P.O.:940; I.V.:2423.3; IV Piggyback:500] Out: 300 [Urine:300] Intake/Output this shift: I/O this shift: In: 1 [Other:1] Out: -   General appearance: alert and cooperative Resp: clear to auscultation bilaterally Cardio: regular rate and rhythm, S1, S2 normal, no murmur, click, rub or gallop Abdomen: Soft, less distended. Incision healing well. Minimal bowel sounds.  Lab Results:   [facility] 04/11/11 0509 04/09/11 0517  WBC 8.5 10.7*  HGB 10.0* 9.0*  HCT 29.0* 26.3*  PLT 319 198   BMET  Basename 04/11/11 0509 04/09/11 0517  NA 136 140  K 3.6 3.1*  CL 102 106  CO2 29 30  GLUCOSE 114* 98  BUN 9 15  CREATININE 0.62 0.65  CALCIUM 8.6 8.2*   Anti-infectives: Anti-infectives     Start     Dose/Rate Route Frequency Ordered Stop   04/08/11 1615   ciprofloxacin (CIPRO) IVPB 400 mg        400 mg 200 mL/hr over 60 Minutes Intravenous Every 12 hours 04/08/11 1602 04/08/11 1815   04/06/11 1800   metroNIDAZOLE (FLAGYL) IVPB 500 mg  Status:  Discontinued        500 mg 100 mL/hr over 60 Minutes Intravenous 4 times per day 04/06/11 1718 04/08/11 1602   04/06/11 1730   ciprofloxacin (CIPRO) IVPB 400 mg  Status:  Discontinued        400 mg 200 mL/hr over 60 Minutes Intravenous Every 12 hours 04/06/11 1718 04/08/11 1616   04/05/11 1200   Ampicillin-Sulbactam (UNASYN) 3 g in sodium chloride 0.9 % 100 mL IVPB  Status:  Discontinued        3 g 100 mL/hr over 60 Minutes Intravenous Every 6 hours 04/05/11  1144 04/06/11 1718   04/04/11 1800   Ampicillin-Sulbactam (UNASYN) 3 g in sodium chloride 0.9 % 100 mL IVPB  Status:  Discontinued        3 g 100 mL/hr over 60 Minutes Intravenous Every 6 hours 04/04/11 1755 04/05/11 1144          Assessment/Plan: s/p Procedure(s): EXPLORATORY LAPAROTOMY, partial small bowel resection Assessment: Awaiting return of bowel function. Hypokalemia resolved. Leukocytosis resolved. Plan: Decrease maintenance IV fluid. Milk of magnesia when necessary.  LOS: 7 days    Jennah Satchell A 04/11/2011

## 2011-04-11 NOTE — Progress Notes (Signed)
Op findings reviewed with Dr. Lovell Sheehan on Friday. Path pending. Patient doing well. Will sign off; thanks.

## 2011-04-12 LAB — TYPE AND SCREEN: Unit division: 0

## 2011-04-12 MED ORDER — SODIUM CHLORIDE 0.9 % IJ SOLN
INTRAMUSCULAR | Status: AC
  Administered 2011-04-12: 10 mL
  Filled 2011-04-12: qty 10

## 2011-04-12 MED ORDER — METOPROLOL TARTRATE 50 MG PO TABS
50.0000 mg | ORAL_TABLET | Freq: Two times a day (BID) | ORAL | Status: DC
  Administered 2011-04-12 – 2011-04-16 (×9): 50 mg via ORAL
  Filled 2011-04-12 (×9): qty 1

## 2011-04-12 NOTE — Progress Notes (Signed)
4 Days Post-Op  Subjective: Patient had episode of flatus yesterday. No nausea or vomiting. No significant abdominal pain.  Objective: Vital signs in last 24 hours: Temp:  [99.1 F (37.3 C)] 99.1 F (37.3 C) (07/23 1354) Pulse Rate:  [70] 70  (07/23 1354) Resp:  [20] 20  (07/23 1354) BP: (140)/(80) 140/80 mmHg (07/23 1354) SpO2:  [96 %] 96 % (07/23 1354) Last BM Date: 04/07/11  Intake/Output from previous day: 07/23 0701 - 07/24 0700 In: 5925.3 [I.V.:5913.3; IV Piggyback:10] Out: -  Intake/Output this shift:    General appearance: alert and cooperative Resp: clear to auscultation bilaterally Cardio: regular rate and rhythm, S1, S2 normal, no murmur, click, rub or gallop Abdomen: Soft, slightly distended. Positive bowel sounds. Incision healing well.  Lab Results:   [facility] 04/11/11 0509  WBC 8.5  HGB 10.0*  HCT 29.0*  PLT 319   BMET  Basename 04/11/11 0509  NA 136  K 3.6  CL 102  CO2 29  GLUCOSE 114*  BUN 9  CREATININE 0.62  CALCIUM 8.6    Anti-infectives: Anti-infectives     Start     Dose/Rate Route Frequency Ordered Stop   04/08/11 1615   ciprofloxacin (CIPRO) IVPB 400 mg        400 mg 200 mL/hr over 60 Minutes Intravenous Every 12 hours 04/08/11 1602 04/08/11 1815   04/06/11 1800   metroNIDAZOLE (FLAGYL) IVPB 500 mg  Status:  Discontinued        500 mg 100 mL/hr over 60 Minutes Intravenous 4 times per day 04/06/11 1718 04/08/11 1602   04/06/11 1730   ciprofloxacin (CIPRO) IVPB 400 mg  Status:  Discontinued        400 mg 200 mL/hr over 60 Minutes Intravenous Every 12 hours 04/06/11 1718 04/08/11 1616   04/05/11 1200   Ampicillin-Sulbactam (UNASYN) 3 g in sodium chloride 0.9 % 100 mL IVPB  Status:  Discontinued        3 g 100 mL/hr over 60 Minutes Intravenous Every 6 hours 04/05/11 1144 04/06/11 1718   04/04/11 1800   Ampicillin-Sulbactam (UNASYN) 3 g in sodium chloride 0.9 % 100 mL IVPB  Status:  Discontinued        3 g 100 mL/hr over 60  Minutes Intravenous Every 6 hours 04/04/11 1755 04/05/11 1144          Assessment/Plan: s/p Procedure(s): EXPLORATORY LAPAROTOMY, partial small bowel resection  Impression: Doing well. Bowel function starting to return.  Plan: Advance to full liquid diet. Check labs in a.m.   LOS: 8 days    Stuart Mirabile A 04/12/2011

## 2011-04-12 NOTE — Progress Notes (Signed)
UR Chart Review Completed  

## 2011-04-13 LAB — BASIC METABOLIC PANEL
CO2: 23 mEq/L (ref 19–32)
Chloride: 103 mEq/L (ref 96–112)
Creatinine, Ser: 0.66 mg/dL (ref 0.50–1.35)
GFR calc Af Amer: >60 mL/min (ref >60)
Potassium: 4.1 mEq/L (ref 3.5–5.1)
Sodium: 135 mEq/L (ref 135–145)

## 2011-04-13 LAB — CBC
Platelets: 366 10*3/uL (ref 150–400)
RBC: 2.9 MIL/uL — ABNORMAL LOW (ref 4.22–5.81)
RDW: 13.1 % (ref 11.5–15.5)
WBC: 9.5 10*3/uL (ref 4.0–10.5)

## 2011-04-13 MED ORDER — HYDROMORPHONE HCL 1 MG/ML IJ SOLN
2.0000 mg | INTRAMUSCULAR | Status: DC | PRN
  Administered 2011-04-13 – 2011-04-16 (×20): 2 mg via INTRAVENOUS
  Filled 2011-04-13 (×20): qty 2

## 2011-04-13 MED ORDER — LORAZEPAM 2 MG/ML IJ SOLN: 1.0000 mg | Freq: Once | INTRAMUSCULAR | Status: DC

## 2011-04-13 MED ORDER — LORAZEPAM 2 MG/ML IJ SOLN
INTRAMUSCULAR | Status: AC
  Administered 2011-04-13: 18:00:00 via INTRAMUSCULAR
  Filled 2011-04-13: qty 1

## 2011-04-13 NOTE — Progress Notes (Signed)
Pt c/o of pain in abdomen, spread to back.  Medicated per prn order. Has saturated two more ABD pads since last progress note. Dr Lovell Sheehan has been in to see patient since last progress note.

## 2011-04-13 NOTE — Progress Notes (Signed)
Continued from last note, Notified Dr Lovell Sheehan of change via text page

## 2011-04-13 NOTE — Progress Notes (Signed)
Pt up to bathroom to void, noticed large amt of serous drainage from bottom part of incision.  Dressed with ABD pad and had pt lie down. Pt has saturated 5 ABD pads, MD paged.  Pt c/o increase in pain describes as burning.  Had pt lie down and am keeping clean dressings in place waiting on MD to call back.

## 2011-04-13 NOTE — Progress Notes (Signed)
5 Days Post-Op  Subjective: Has had multiple bowel movements over the last 24 hours. No nausea or vomiting. Tolerating full liquid diet well.  Objective: Vital signs in last 24 hours: Temp:  [97.7 F (36.5 C)-99 F (37.2 C)] 98 F (36.7 C) (07/25 0541) Pulse Rate:  [54-87] 86  (07/25 0908) Resp:  [18-20] 18  (07/25 0541) BP: (101-125)/(54-70) 119/62 mmHg (07/25 0908) SpO2:  [96 %-98 %] 96 % (07/25 0541) Last BM Date: 04/13/11  Intake/Output from previous day: 07/24 0701 - 07/25 0700 In: 2080.4 [P.O.:1420; I.V.:660.4] Out: -  Intake/Output this shift: I/O this shift: In: 1170 [P.O.:1170] Out: -   General appearance: alert and cooperative Resp: clear to auscultation bilaterally Cardio: regular rate and rhythm, S1, S2 normal, no murmur, click, rub or gallop Abdomen soft. Positive bowel sounds. Incision healing well without drainage.  Lab Results:   [facility] 04/13/11 0552 04/11/11 0509  WBC 9.5 8.5  HGB 9.7* 10.0*  HCT 28.5* 29.0*  PLT 366 319   BMET  Basename 04/13/11 0552 04/11/11 0509  NA 135 136  K 4.1 3.6  CL 103 102  CO2 23 29  GLUCOSE 119* 114*  BUN 6 9  CREATININE 0.66 0.62  CALCIUM 9.0 8.6   Anti-infectives: Anti-infectives     Start     Dose/Rate Route Frequency Ordered Stop   04/08/11 1615   ciprofloxacin (CIPRO) IVPB 400 mg        400 mg 200 mL/hr over 60 Minutes Intravenous Every 12 hours 04/08/11 1602 04/08/11 1815   04/06/11 1800   metroNIDAZOLE (FLAGYL) IVPB 500 mg  Status:  Discontinued        500 mg 100 mL/hr over 60 Minutes Intravenous 4 times per day 04/06/11 1718 04/08/11 1602   04/06/11 1730   ciprofloxacin (CIPRO) IVPB 400 mg  Status:  Discontinued        400 mg 200 mL/hr over 60 Minutes Intravenous Every 12 hours 04/06/11 1718 04/08/11 1616   04/05/11 1200   Ampicillin-Sulbactam (UNASYN) 3 g in sodium chloride 0.9 % 100 mL IVPB  Status:  Discontinued        3 g 100 mL/hr over 60 Minutes Intravenous Every 6 hours 04/05/11 1144  04/06/11 1718   04/04/11 1800   Ampicillin-Sulbactam (UNASYN) 3 g in sodium chloride 0.9 % 100 mL IVPB  Status:  Discontinued        3 g 100 mL/hr over 60 Minutes Intravenous Every 6 hours 04/04/11 1755 04/05/11 1144          Assessment/Plan: s/p Procedure(s): EXPLORATORY LAPAROTOMY, partial small bowel resection  Impression: Bowel function has returned. Anemia is secondary to fluid hydration and surgery.  Plan: Will advance to regular diet. Decrease maintenance fluids. Plan for discharge tomorrow if patient tolerates diet.  LOS: 9 days    Randy Le A 04/13/2011

## 2011-04-14 MED ORDER — PANTOPRAZOLE SODIUM 40 MG PO TBEC
40.0000 mg | DELAYED_RELEASE_TABLET | Freq: Every day | ORAL | Status: DC
  Administered 2011-04-14 – 2011-04-15 (×2): 40 mg via ORAL
  Filled 2011-04-14 (×2): qty 1

## 2011-04-14 MED ORDER — ZOLPIDEM TARTRATE 5 MG PO TABS
10.0000 mg | ORAL_TABLET | Freq: Every evening | ORAL | Status: DC | PRN
  Administered 2011-04-14: 10 mg via ORAL
  Filled 2011-04-14: qty 2

## 2011-04-14 MED ORDER — KETOROLAC TROMETHAMINE 30 MG/ML IJ SOLN
30.0000 mg | Freq: Four times a day (QID) | INTRAMUSCULAR | Status: DC | PRN
  Administered 2011-04-14: 30 mg via INTRAVENOUS
  Filled 2011-04-14: qty 1

## 2011-04-14 MED ORDER — CYCLOBENZAPRINE HCL 10 MG PO TABS
10.0000 mg | ORAL_TABLET | Freq: Three times a day (TID) | ORAL | Status: DC | PRN
  Administered 2011-04-14: 10 mg via ORAL
  Filled 2011-04-14: qty 1

## 2011-04-14 NOTE — Progress Notes (Signed)
6 Days Post-Op  Subjective: Still having significant incisional pain. This makes him extremely anxious. Serosanguineous drainage from the lower portion of wound is decreasing.  Objective: Vital signs in last 24 hours: Temp:  [98.1 F (36.7 C)-98.6 F (37 C)] 98.6 F (37 C) (07/26 0600) Pulse Rate:  [73-89] 73  (07/26 0600) Resp:  [20] 20  (07/26 0600) BP: (119-136)/(62-76) 136/69 mmHg (07/26 0600) SpO2:  [95 %-96 %] 95 % (07/26 0600) Last BM Date: 04/13/11  Intake/Output from previous day: 07/25 0701 - 07/26 0700 In: 2509.2 [P.O.:1650; I.V.:859.2] Out: -  Intake/Output this shift:    General appearance: alert Resp: clear to auscultation bilaterally Cardio: regular rate and rhythm, S1, S2 normal, no murmur, click, rub or gallop Abdomen: Soft, nondistended. Incision without erythema. Fascia appears to be intact. No hematoma or ecchymosis noted. Abdomen without rigidity.  Lab Results:   [facility] 04/13/11 0552  WBC 9.5  HGB 9.7*  HCT 28.5*  PLT 366   BMET  Basename 04/13/11 0552  NA 135  K 4.1  CL 103  CO2 23  GLUCOSE 119*  BUN 6  CREATININE 0.66  CALCIUM 9.0    Anti-infectives: Anti-infectives     Start     Dose/Rate Route Frequency Ordered Stop   04/08/11 1615   ciprofloxacin (CIPRO) IVPB 400 mg        400 mg 200 mL/hr over 60 Minutes Intravenous Every 12 hours 04/08/11 1602 04/08/11 1815   04/06/11 1800   metroNIDAZOLE (FLAGYL) IVPB 500 mg  Status:  Discontinued        500 mg 100 mL/hr over 60 Minutes Intravenous 4 times per day 04/06/11 1718 04/08/11 1602   04/06/11 1730   ciprofloxacin (CIPRO) IVPB 400 mg  Status:  Discontinued        400 mg 200 mL/hr over 60 Minutes Intravenous Every 12 hours 04/06/11 1718 04/08/11 1616   04/05/11 1200   Ampicillin-Sulbactam (UNASYN) 3 g in sodium chloride 0.9 % 100 mL IVPB  Status:  Discontinued        3 g 100 mL/hr over 60 Minutes Intravenous Every 6 hours 04/05/11 1144 04/06/11 1718   04/04/11 1800    Ampicillin-Sulbactam (UNASYN) 3 g in sodium chloride 0.9 % 100 mL IVPB  Status:  Discontinued        3 g 100 mL/hr over 60 Minutes Intravenous Every 6 hours 04/04/11 1755 04/05/11 1144          Assessment/Plan: s/p Procedure(s): EXPLORATORY LAPAROTOMY, partial small bowel resection  Other than muscle spasms from his incision, my suspicion of an intra-abdominal problem is low. The a drainage is clear, probably emanating from a partial leakage of fluid from the fascia. A full blown fascial dehiscence is not appreciated. He is moving his bowels.  Will try Restoril and Flexeril for control of his pain and anxiety. Ativan seemed to make the situation worse. Will check labs in a.m. I tried to reassure the patient that he was progressing well. Will continue to monitor.   LOS: 10 days    Bethania Schlotzhauer A 04/14/2011

## 2011-04-14 NOTE — Progress Notes (Signed)
Pt has saturated approx 10 ABD pads during this 12 hr shift.  Pt's pain has been relatively well controlled this shift. Pt's abd is slightly distended, but soft/tender.

## 2011-04-15 LAB — BASIC METABOLIC PANEL
GFR calc non Af Amer: >60 mL/min (ref >60)
Glucose, Bld: 111 mg/dL — ABNORMAL HIGH (ref 70–99)
Potassium: 4.1 mEq/L (ref 3.5–5.1)
Sodium: 134 mEq/L — ABNORMAL LOW (ref 135–145)

## 2011-04-15 LAB — CBC
Hemoglobin: 9.4 g/dL — ABNORMAL LOW (ref 13.0–17.0)
MCH: 33 pg (ref 26.0–34.0)
RBC: 2.85 MIL/uL — ABNORMAL LOW (ref 4.22–5.81)
WBC: 15.4 10*3/uL — ABNORMAL HIGH (ref 4.0–10.5)

## 2011-04-15 MED ORDER — SODIUM CHLORIDE 0.9 % IJ SOLN
INTRAMUSCULAR | Status: AC
  Administered 2011-04-15: 10 mL
  Filled 2011-04-15: qty 10

## 2011-04-15 NOTE — Progress Notes (Signed)
7 Days Post-Op  Subjective: Feels somewhat better today. Still anxious about his drainage from the wound.  Objective: Vital signs in last 24 hours: Temp:  [98.5 F (36.9 C)] 98.5 F (36.9 C) (07/26 2131) Pulse Rate:  [69-95] 95  (07/26 2131) Resp:  [18-19] 18  (07/26 2131) BP: (109-141)/(63-64) 109/64 mmHg (07/26 2131) SpO2:  [90 %-98 %] 98 % (07/26 2131) Last BM Date: 04/14/11  Intake/Output from previous day: 07/26 0701 - 07/27 0700 In: 836.7 [P.O.:420; I.V.:416.7] Out: -  Intake/Output this shift:    General appearance: alert and cooperative Resp: clear to auscultation bilaterally Cardio: regular rate and rhythm, S1, S2 normal, no murmur, click, rub or gallop Abdomen: Soft, nondistended. No rigidity noted. Minimal serous drainage from lower portion of the wound. No purulence noted. No erythema noted. Fascia intact.  Lab Results:   [facility] 04/15/11 0501 04/13/11 0552  WBC 15.4* 9.5  HGB 9.4* 9.7*  HCT 28.1* 28.5*  PLT 581* 366   BMET  Basename 04/15/11 0501 04/13/11 0552  NA 134* 135  K 4.1 4.1  CL 95* 103  CO2 27 23  GLUCOSE 111* 119*  BUN 10 6  CREATININE 0.82 0.66  CALCIUM 9.2 9.0   Anti-infectives: Anti-infectives     Start     Dose/Rate Route Frequency Ordered Stop   04/08/11 1615   ciprofloxacin (CIPRO) IVPB 400 mg        400 mg 200 mL/hr over 60 Minutes Intravenous Every 12 hours 04/08/11 1602 04/08/11 1815   04/06/11 1800   metroNIDAZOLE (FLAGYL) IVPB 500 mg  Status:  Discontinued        500 mg 100 mL/hr over 60 Minutes Intravenous 4 times per day 04/06/11 1718 04/08/11 1602   04/06/11 1730   ciprofloxacin (CIPRO) IVPB 400 mg  Status:  Discontinued        400 mg 200 mL/hr over 60 Minutes Intravenous Every 12 hours 04/06/11 1718 04/08/11 1616   04/05/11 1200   Ampicillin-Sulbactam (UNASYN) 3 g in sodium chloride 0.9 % 100 mL IVPB  Status:  Discontinued        3 g 100 mL/hr over 60 Minutes Intravenous Every 6 hours 04/05/11 1144 04/06/11 1718     04/04/11 1800   Ampicillin-Sulbactam (UNASYN) 3 g in sodium chloride 0.9 % 100 mL IVPB  Status:  Discontinued        3 g 100 mL/hr over 60 Minutes Intravenous Every 6 hours 04/04/11 1755 04/05/11 1144          Assessment/Plan: s/p Procedure(s): EXPLORATORY LAPAROTOMY, partial small bowel resection Leukocytosis of unknown etiology. Given his benign abdominal examination, we'll just monitor at this point. Patient denies any diarrhea or fevers. Plan for discharge tomorrow pending repeat CBC. Have again ordered an abdominal binder. Continue ciprofloxacin at this time.   LOS: 11 days    Shalva Rozycki A 04/15/2011

## 2011-04-16 LAB — CBC
MCH: 33.3 pg (ref 26.0–34.0)
MCV: 97.7 fL (ref 78.0–100.0)
Platelets: 535 10*3/uL — ABNORMAL HIGH (ref 150–400)
RDW: 13.2 % (ref 11.5–15.5)

## 2011-04-16 MED ORDER — OXYCODONE-ACETAMINOPHEN 7.5-325 MG PO TABS
ORAL_TABLET | ORAL | Status: DC
Start: 2011-04-16 — End: 2013-08-22

## 2011-04-16 MED ORDER — CYCLOBENZAPRINE HCL 10 MG PO TABS: 10.0000 mg | ORAL_TABLET | Freq: Three times a day (TID) | ORAL | Status: AC | PRN

## 2011-04-16 NOTE — Discharge Summary (Signed)
Physician Discharge Summary  Patient ID: Randy Le MRN: [id number] DOB/AGE: [birth year] 55 y.o.  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 CT scan of the abdomen and pelvis at the time of admission revealed a partial small bowel obstruction secondary to presumed ileitis. He was started on ciprofloxacin and Flagyl. The surgery was consultation was obtained. At that time, it was felt that he did not require surgical intervention as he has never had abdominal surgery in the past. His hospital course was available. A gastroenterology consultation was obtained as it appeared his ileitis was not resolving. A followup abdominal film was obtained on 04/08/2011 which showed worsening abdominal distention and small intestinal distention. It was elected to proceed with an exploratory laparotomy. He underwent the surgery on 04/08/2003. At the time of surgery, he was found to have a closed loop obstruction with approximately 2-3 feet of infarcted small intestine in the ileum. A single piece of omentum which had attached to the abdominal wall was the cause of the closed loop obstruction. He tolerated the surgery well.  His postoperative course was remarkable for a delayed recovery of his bowel function. Once his bowel function returned, he started having clear yellow drainage from the lower portion of his wound. His fascia is intact and there is no evidence of a fascial dehiscence. He did have a abdominal wall spasms which have been controlled with Flexeril. He did develop leukocytosis which has since  been resolving. He has been on ciprofloxacin postoperatively for 7 days.  The patient is being discharged home in fair and stable condition.  Consults: GI and general surgery  Significant Diagnostic Studies: See chart, above  Treatments: surgery: Exploratory laparotomy, partial small bowel resection on 04/08/2011  Discharge Exam: Blood pressure 96/54, pulse 68, temperature 98.2 F (36.8 C), temperature source Oral, resp. rate 20, height 5\' 8"  (1.727 m), weight 73.8 kg (162 lb 11.2 oz), SpO2 94.00%. General appearance: alert and cooperative Resp: clear to auscultation bilaterally Cardio: regular rate and rhythm, S1, S2 normal, no murmur, click, rub or gallop Abdomen: Soft, nontender, nondistended. Incision healing well without drainage. No erythema is noted. No fascial dehiscence is noted.  Disposition: Home  Current Discharge Medication List    START taking these medications   Details  cyclobenzaprine (FLEXERIL) 10 MG tablet Take 1 tablet (10 mg total) by mouth 3 (three) times daily as needed for muscle spasms. Qty: 30 tablet, Refills: 0      CONTINUE these medications which have NOT CHANGED   Details  metoprolol (TOPROL-XL) 100 MG 24 hr tablet Take 100 mg by mouth daily.      Multiple Vitamin (MULTIVITAMIN) capsule Take 1 capsule by mouth daily.         Follow-up Information    Follow up with Silus Lanzo A on 04/21/2011.   Contact information:   [address] [place] [address] [phone]          Signed: Dalia Heading 04/16/2011, 10:09 AM

## 2011-04-16 NOTE — Progress Notes (Signed)
Reviewed discharge instructions with pt, prescriptions for Percocet and Flexeril given along with care notes on both medications.  Pt is alert and oriented and has verbalized understanding of instructions.  Also educated pt about hand hygiene when cleaning abdominal incision.  Pt's family at bedside, pt to call nurse when he is dressed and ready to be taken downstairs.

## 2011-04-18 ENCOUNTER — Encounter (HOSPITAL_COMMUNITY): Payer: Self-pay | Admitting: General Surgery

## 2011-05-19 NOTE — Progress Notes (Signed)
Encounter addended by: Clarene Critchley on: 05/19/2011  6:24 AM<BR>     Documentation filed: Flowsheet VN

## 2013-07-28 ENCOUNTER — Encounter (HOSPITAL_COMMUNITY): Payer: Self-pay | Admitting: Emergency Medicine

## 2013-07-28 ENCOUNTER — Emergency Department (HOSPITAL_COMMUNITY)
Admission: EM | Admit: 2013-07-28 | Discharge: 2013-07-28 | Disposition: A | Discharge status: Home / Self Care | Payer: Federal, State, Local not specified - PPO | Attending: Emergency Medicine | Admitting: Emergency Medicine

## 2013-07-28 DIAGNOSIS — I10 Essential (primary) hypertension: Secondary | ICD-10-CM | POA: Insufficient documentation

## 2013-07-28 DIAGNOSIS — Z79899 Other long term (current) drug therapy: Secondary | ICD-10-CM | POA: Insufficient documentation

## 2013-07-28 DIAGNOSIS — R002 Palpitations: Secondary | ICD-10-CM | POA: Insufficient documentation

## 2013-07-28 DIAGNOSIS — Z720 Tobacco use: Secondary | ICD-10-CM

## 2013-07-28 DIAGNOSIS — Z8669 Personal history of other diseases of the nervous system and sense organs: Secondary | ICD-10-CM | POA: Insufficient documentation

## 2013-07-28 DIAGNOSIS — K219 Gastro-esophageal reflux disease without esophagitis: Secondary | ICD-10-CM

## 2013-07-28 DIAGNOSIS — F17200 Nicotine dependence, unspecified, uncomplicated: Secondary | ICD-10-CM | POA: Insufficient documentation

## 2013-07-28 MED ORDER — PANTOPRAZOLE SODIUM 40 MG PO TBEC: 40.0000 mg | DELAYED_RELEASE_TABLET | Freq: Every day | ORAL | Status: DC

## 2013-07-28 NOTE — ED Notes (Addendum)
Pt c/o epigstric pain at times with deep breaths. no appetite, vomiting at night, burning in chest area,  reflux, states that he was able to sleep better last night because he slept in a recliner, started having symptoms a few weeks ago, states that he has been taking tums for the past week with relief in the burning sensation,

## 2013-07-28 NOTE — ED Provider Notes (Signed)
CSN: [id number]     Arrival date & time 07/28/13  1016 History  This chart was scribed for Flint Melter, MD by Quintella Reichert, ED scribe.  This patient was seen in room APA18/APA18 and the patient's care was started at 10:46 AM.   Chief Complaint  Patient presents with  . Abdominal Pain    The history is provided by the patient. No language interpreter was used.    HPI Comments: Randy Le is a 57 y.o. male who presents to the Emergency Department complaining of 2 weeks of worsening intermittent moderate burning epigastric pain that is worse at night, with associated regurgitation.  Pt states that he has been spitting up food from his stomach with associated burning pain.  He denies choking.  He states his pain is worsened by deep breathing, lying down, and after eating, and is usually improved when sitting up.  He notes that he was able to sleep better last night because he slept in a recliner.  His discomfort after eating is also relieved by Tums.  He has also been anorexic for the past week.  He is able to eat but states he has had no appetite.  He denies SOB, headache, weakness, or dizziness.  He also states "sometimes it feels like my heart is beating a little harder than it should."  Pt is a smoker.   Past Medical History  Diagnosis Date  . Hypertension   . Seizures     Past Surgical History  Procedure Laterality Date  . Hernia repair    . Laparotomy  04/08/2011    Procedure: EXPLORATORY LAPAROTOMY;  Surgeon: Dalia Heading;  Location: [facility];  Service: General;  Laterality: N/A;  Exploratory Laparotomy with Partial Small Bowel Resection    No family history on file.   History  Substance Use Topics  . Smoking status: Smoker, Current Status Unknown -- 1.00 packs/day  . Smokeless tobacco: Not on file  . Alcohol Use: 0.0 oz/week    3-4 Cans of beer per week     Comment: daily     Review of Systems  Constitutional: Positive for appetite change.  Respiratory:  Negative for shortness of breath.   Cardiovascular: Positive for palpitations.  Gastrointestinal: Positive for abdominal pain (epigastric).       Regurgitation  Neurological: Negative for dizziness, weakness and headaches.  All other systems reviewed and are negative.     Allergies  Review of patient's allergies indicates no known allergies.  Home Medications   Current Outpatient Rx  Name  Route  Sig  Dispense  Refill  . metoprolol (TOPROL-XL) 100 MG 24 hr tablet   Oral   Take 100 mg by mouth daily.           . Multiple Vitamin (MULTIVITAMIN) capsule   Oral   Take 1 capsule by mouth daily.           Marland Kitchen oxyCODONE-acetaminophen (PERCOCET) 7.5-325 MG per tablet      1 - 2 tablets po q4hrs prn pain   50 tablet   0   . pantoprazole (PROTONIX) 40 MG tablet   Oral   Take 1 tablet (40 mg total) by mouth daily.   30 tablet   1    BP 161/96  Pulse 84  Temp(Src) 99.1 F (37.3 C) (Oral)  Resp 16  Ht 5\' 8"  (1.727 m)  Wt 130 lb (58.968 kg)  BMI 19.77 kg/m2  SpO2 99%  Physical Exam  Nursing note  and vitals reviewed. Constitutional: He is oriented to person, place, and time. He appears well-developed and well-nourished.  HENT:  Head: Normocephalic and atraumatic.  Right Ear: External ear normal.  Left Ear: External ear normal.  Eyes: Conjunctivae and EOM are normal. Pupils are equal, round, and reactive to light.  Neck: Normal range of motion and phonation normal. Neck supple.  Cardiovascular: Normal rate, regular rhythm, normal heart sounds and intact distal pulses.   Pulmonary/Chest: Effort normal and breath sounds normal. He exhibits no bony tenderness.  Abdominal: Soft. Normal appearance. There is tenderness (mild, epigastric).  Musculoskeletal: Normal range of motion.  Neurological: He is alert and oriented to person, place, and time. No cranial nerve deficit or sensory deficit. He exhibits normal muscle tone. Coordination normal.  Skin: Skin is warm, dry and  intact.  Psychiatric: He has a normal mood and affect. His behavior is normal. Judgment and thought content normal.    ED Course  Procedures (including critical care time)  DIAGNOSTIC STUDIES: Oxygen Saturation is 99% on room air, normal by my interpretation.    COORDINATION OF CARE: 10:54 AM-Informed pt that symptoms are due to GERD.  Discussed treatment plan with pt at bedside and pt agreed to plan.    Labs Review Labs Reviewed - No data to display  Imaging Review No results found.  EKG Interpretation     Ventricular Rate:  72 PR Interval:  120 QRS Duration: 116 QT Interval:  386 QTC Calculation: 422 R Axis:   40 Text Interpretation:  Normal sinus rhythm Normal ECG When compared with ECG of 08-Apr-2011 10:59, Vent. rate has decreased BY  38 BPM Artifact since last tracing no significant change            MDM   1. GERD (gastroesophageal reflux disease)   2. Tobacco abuse     Patient has rather classic symptoms for GERD. Possible peptic ulcer disease as well. Doubt cardiac, pulmonary, or intra-abdominal infection. He is stable for discharge with outpatient management.  Nursing Notes Reviewed/ Care Coordinated, and agree without changes. Applicable Imaging Reviewed.  Interpretation of Laboratory Data incorporated into ED treatment   Plan: Home Medications- prescription PPI; Home Treatments and Observation- stop smoking; return here if the recommended treatment, does not improve the symptoms; Recommended follow up- PCP, for check up in one week      I personally performed the services described in this documentation, which was scribed in my presence. The recorded information has been reviewed and is accurate.     Flint Melter, MD 07/28/13 [phone]

## 2013-07-28 NOTE — ED Notes (Signed)
Pt also concerned with intermittent left arm tingling for the past few months, usually worse at night

## 2013-08-02 ENCOUNTER — Other Ambulatory Visit (HOSPITAL_COMMUNITY): Payer: Self-pay | Admitting: Family Medicine

## 2013-08-02 ENCOUNTER — Ambulatory Visit (HOSPITAL_COMMUNITY)
Admission: RE | Admit: 2013-08-02 | Discharge: 2013-08-02 | Disposition: A | Discharge status: Home / Self Care | Payer: Federal, State, Local not specified - PPO | Source: Ambulatory Visit | Attending: Family Medicine | Admitting: Family Medicine

## 2013-08-02 DIAGNOSIS — J4489 Other specified chronic obstructive pulmonary disease: Secondary | ICD-10-CM | POA: Insufficient documentation

## 2013-08-02 DIAGNOSIS — J449 Chronic obstructive pulmonary disease, unspecified: Secondary | ICD-10-CM

## 2013-08-02 DIAGNOSIS — R634 Abnormal weight loss: Secondary | ICD-10-CM | POA: Insufficient documentation

## 2013-08-09 ENCOUNTER — Other Ambulatory Visit (HOSPITAL_COMMUNITY): Payer: Self-pay | Admitting: Family Medicine

## 2013-08-09 DIAGNOSIS — R634 Abnormal weight loss: Secondary | ICD-10-CM

## 2013-08-09 DIAGNOSIS — R1310 Dysphagia, unspecified: Secondary | ICD-10-CM

## 2013-08-13 ENCOUNTER — Ambulatory Visit (HOSPITAL_COMMUNITY)
Admission: RE | Admit: 2013-08-13 | Discharge: 2013-08-13 | Disposition: A | Discharge status: Home / Self Care | Payer: Federal, State, Local not specified - PPO | Source: Ambulatory Visit | Attending: Family Medicine | Admitting: Family Medicine

## 2013-08-13 ENCOUNTER — Encounter (INDEPENDENT_AMBULATORY_CARE_PROVIDER_SITE_OTHER): Payer: Self-pay | Admitting: *Deleted

## 2013-08-13 ENCOUNTER — Ambulatory Visit (HOSPITAL_COMMUNITY): Payer: Federal, State, Local not specified - PPO

## 2013-08-13 DIAGNOSIS — R1310 Dysphagia, unspecified: Secondary | ICD-10-CM | POA: Insufficient documentation

## 2013-08-13 DIAGNOSIS — R918 Other nonspecific abnormal finding of lung field: Secondary | ICD-10-CM | POA: Insufficient documentation

## 2013-08-13 DIAGNOSIS — M4716 Other spondylosis with myelopathy, lumbar region: Secondary | ICD-10-CM | POA: Insufficient documentation

## 2013-08-13 DIAGNOSIS — R911 Solitary pulmonary nodule: Secondary | ICD-10-CM | POA: Insufficient documentation

## 2013-08-13 DIAGNOSIS — R634 Abnormal weight loss: Secondary | ICD-10-CM | POA: Insufficient documentation

## 2013-08-13 DIAGNOSIS — R188 Other ascites: Secondary | ICD-10-CM | POA: Insufficient documentation

## 2013-08-13 DIAGNOSIS — D3500 Benign neoplasm of unspecified adrenal gland: Secondary | ICD-10-CM | POA: Insufficient documentation

## 2013-08-13 DIAGNOSIS — F17200 Nicotine dependence, unspecified, uncomplicated: Secondary | ICD-10-CM | POA: Insufficient documentation

## 2013-08-13 MED ORDER — IOHEXOL 300 MG/ML  SOLN
100.0000 mL | Freq: Once | INTRAMUSCULAR | Status: AC | PRN
  Administered 2013-08-13: 100 mL via INTRAVENOUS

## 2013-08-22 ENCOUNTER — Ambulatory Visit (INDEPENDENT_AMBULATORY_CARE_PROVIDER_SITE_OTHER): Payer: Federal, State, Local not specified - PPO | Admitting: Internal Medicine

## 2013-08-22 ENCOUNTER — Inpatient Hospital Stay (HOSPITAL_COMMUNITY)
Admission: EM | Admit: 2013-08-22 | Discharge: 2013-09-16 | DRG: 326 | Disposition: A | Discharge status: Home Health | Payer: Federal, State, Local not specified - PPO | Attending: Internal Medicine | Admitting: Internal Medicine

## 2013-08-22 ENCOUNTER — Emergency Department (HOSPITAL_COMMUNITY): Payer: Federal, State, Local not specified - PPO

## 2013-08-22 ENCOUNTER — Encounter (HOSPITAL_COMMUNITY): Payer: Self-pay | Admitting: Emergency Medicine

## 2013-08-22 DIAGNOSIS — J449 Chronic obstructive pulmonary disease, unspecified: Secondary | ICD-10-CM | POA: Diagnosis present

## 2013-08-22 DIAGNOSIS — R609 Edema, unspecified: Secondary | ICD-10-CM

## 2013-08-22 DIAGNOSIS — C184 Malignant neoplasm of transverse colon: Secondary | ICD-10-CM | POA: Diagnosis present

## 2013-08-22 DIAGNOSIS — Z79899 Other long term (current) drug therapy: Secondary | ICD-10-CM

## 2013-08-22 DIAGNOSIS — K3189 Other diseases of stomach and duodenum: Secondary | ICD-10-CM

## 2013-08-22 DIAGNOSIS — E43 Unspecified severe protein-calorie malnutrition: Secondary | ICD-10-CM | POA: Diagnosis present

## 2013-08-22 DIAGNOSIS — F17200 Nicotine dependence, unspecified, uncomplicated: Secondary | ICD-10-CM | POA: Diagnosis present

## 2013-08-22 DIAGNOSIS — J690 Pneumonitis due to inhalation of food and vomit: Secondary | ICD-10-CM | POA: Diagnosis not present

## 2013-08-22 DIAGNOSIS — C169 Malignant neoplasm of stomach, unspecified: Principal | ICD-10-CM | POA: Diagnosis present

## 2013-08-22 DIAGNOSIS — K319 Disease of stomach and duodenum, unspecified: Secondary | ICD-10-CM

## 2013-08-22 DIAGNOSIS — R7989 Other specified abnormal findings of blood chemistry: Secondary | ICD-10-CM | POA: Diagnosis present

## 2013-08-22 DIAGNOSIS — D630 Anemia in neoplastic disease: Secondary | ICD-10-CM | POA: Diagnosis present

## 2013-08-22 DIAGNOSIS — IMO0002 Reserved for concepts with insufficient information to code with codable children: Secondary | ICD-10-CM

## 2013-08-22 DIAGNOSIS — C179 Malignant neoplasm of small intestine, unspecified: Secondary | ICD-10-CM | POA: Diagnosis present

## 2013-08-22 DIAGNOSIS — R188 Other ascites: Secondary | ICD-10-CM | POA: Diagnosis present

## 2013-08-22 DIAGNOSIS — K529 Noninfective gastroenteritis and colitis, unspecified: Secondary | ICD-10-CM

## 2013-08-22 DIAGNOSIS — Z720 Tobacco use: Secondary | ICD-10-CM | POA: Diagnosis present

## 2013-08-22 DIAGNOSIS — K219 Gastro-esophageal reflux disease without esophagitis: Secondary | ICD-10-CM

## 2013-08-22 DIAGNOSIS — Z9049 Acquired absence of other specified parts of digestive tract: Secondary | ICD-10-CM

## 2013-08-22 DIAGNOSIS — J4489 Other specified chronic obstructive pulmonary disease: Secondary | ICD-10-CM | POA: Diagnosis present

## 2013-08-22 DIAGNOSIS — G893 Neoplasm related pain (acute) (chronic): Secondary | ICD-10-CM | POA: Diagnosis present

## 2013-08-22 DIAGNOSIS — R109 Unspecified abdominal pain: Secondary | ICD-10-CM | POA: Insufficient documentation

## 2013-08-22 DIAGNOSIS — E860 Dehydration: Secondary | ICD-10-CM | POA: Diagnosis present

## 2013-08-22 DIAGNOSIS — K311 Adult hypertrophic pyloric stenosis: Secondary | ICD-10-CM | POA: Diagnosis present

## 2013-08-22 DIAGNOSIS — I10 Essential (primary) hypertension: Secondary | ICD-10-CM | POA: Diagnosis present

## 2013-08-22 DIAGNOSIS — C50919 Malignant neoplasm of unspecified site of unspecified female breast: Secondary | ICD-10-CM | POA: Diagnosis present

## 2013-08-22 DIAGNOSIS — E871 Hypo-osmolality and hyponatremia: Secondary | ICD-10-CM | POA: Diagnosis present

## 2013-08-22 DIAGNOSIS — R933 Abnormal findings on diagnostic imaging of other parts of digestive tract: Secondary | ICD-10-CM

## 2013-08-22 DIAGNOSIS — R600 Localized edema: Secondary | ICD-10-CM | POA: Diagnosis present

## 2013-08-22 DIAGNOSIS — R634 Abnormal weight loss: Secondary | ICD-10-CM | POA: Diagnosis present

## 2013-08-22 HISTORY — DX: Gastro-esophageal reflux disease without esophagitis: K21.9

## 2013-08-22 HISTORY — DX: Reserved for inherently not codable concepts without codable children: IMO0001

## 2013-08-22 LAB — COMPREHENSIVE METABOLIC PANEL
AST: 12 U/L (ref 0–37)
Albumin: 2.4 g/dL — ABNORMAL LOW (ref 3.5–5.2)
Alkaline Phosphatase: 48 U/L (ref 39–117)
Chloride: 92 mEq/L — ABNORMAL LOW (ref 96–112)
Creatinine, Ser: 1 mg/dL (ref 0.50–1.35)
Potassium: 3.9 mEq/L (ref 3.5–5.1)
Total Bilirubin: 0.3 mg/dL (ref 0.3–1.2)

## 2013-08-22 LAB — CBC WITH DIFFERENTIAL/PLATELET
Basophils Absolute: 0 10*3/uL (ref 0.0–0.1)
Basophils Relative: 0 % (ref 0–1)
MCHC: 35.1 g/dL (ref 30.0–36.0)
Neutro Abs: 6.1 10*3/uL (ref 1.7–7.7)
Neutrophils Relative %: 75 % (ref 43–77)
RDW: 12.1 % (ref 11.5–15.5)

## 2013-08-22 LAB — URINALYSIS, ROUTINE W REFLEX MICROSCOPIC
Glucose, UA: NEGATIVE mg/dL
Leukocytes, UA: NEGATIVE
Specific Gravity, Urine: 1.025 (ref 1.005–1.030)
pH: 6 (ref 5.0–8.0)

## 2013-08-22 MED ORDER — ALBUTEROL SULFATE (5 MG/ML) 0.5% IN NEBU
2.5000 mg | INHALATION_SOLUTION | RESPIRATORY_TRACT | Status: DC | PRN
  Administered 2013-08-23 (×3): 2.5 mg via RESPIRATORY_TRACT
  Filled 2013-08-22 (×3): qty 0.5

## 2013-08-22 MED ORDER — SODIUM CHLORIDE 0.9 % IV SOLN
INTRAVENOUS | Status: DC
  Administered 2013-08-22 – 2013-08-23 (×2): via INTRAVENOUS

## 2013-08-22 MED ORDER — NICOTINE 14 MG/24HR TD PT24
14.0000 mg | MEDICATED_PATCH | Freq: Every day | TRANSDERMAL | Status: DC
  Administered 2013-08-23 – 2013-09-16 (×25): 14 mg via TRANSDERMAL
  Filled 2013-08-22 (×25): qty 1

## 2013-08-22 MED ORDER — PANTOPRAZOLE SODIUM 40 MG IV SOLR
40.0000 mg | Freq: Once | INTRAVENOUS | Status: AC
  Administered 2013-08-22: 40 mg via INTRAVENOUS
  Filled 2013-08-22: qty 40

## 2013-08-22 MED ORDER — ONDANSETRON HCL 4 MG PO TABS: 4.0000 mg | ORAL_TABLET | Freq: Four times a day (QID) | ORAL | Status: DC | PRN

## 2013-08-22 MED ORDER — PANTOPRAZOLE SODIUM 40 MG IV SOLR
40.0000 mg | INTRAVENOUS | Status: DC
  Administered 2013-08-23: 40 mg via INTRAVENOUS
  Filled 2013-08-22: qty 40

## 2013-08-22 MED ORDER — ENOXAPARIN SODIUM 40 MG/0.4ML ~~LOC~~ SOLN
40.0000 mg | SUBCUTANEOUS | Status: AC
  Filled 2013-08-22: qty 0.4

## 2013-08-22 MED ORDER — ACETAMINOPHEN 650 MG RE SUPP: 650.0000 mg | Freq: Four times a day (QID) | RECTAL | Status: DC | PRN

## 2013-08-22 MED ORDER — ONDANSETRON HCL 4 MG/2ML IJ SOLN
4.0000 mg | Freq: Four times a day (QID) | INTRAMUSCULAR | Status: DC | PRN
  Administered 2013-08-23 – 2013-08-24 (×2): 4 mg via INTRAVENOUS
  Filled 2013-08-22 (×2): qty 2

## 2013-08-22 MED ORDER — ALUM & MAG HYDROXIDE-SIMETH 200-200-20 MG/5ML PO SUSP
30.0000 mL | Freq: Four times a day (QID) | ORAL | Status: DC | PRN
  Administered 2013-08-22: 30 mL via ORAL
  Filled 2013-08-22: qty 30

## 2013-08-22 MED ORDER — ACETAMINOPHEN 325 MG PO TABS: 650.0000 mg | ORAL_TABLET | Freq: Four times a day (QID) | ORAL | Status: DC | PRN

## 2013-08-22 NOTE — ED Provider Notes (Signed)
CSN: [id number]     Arrival date & time 08/22/13  1543 History  This chart was scribed for Randy Lennert, MD by Bennett Scrape, ED Scribe. This patient was seen in room APA08/APA08 and the patient's care was started at 4:19 PM.  Chief Complaint  Patient presents with  . Leg Swelling    Patient is a 57 y.o. male presenting with general illness. The history is provided by the patient. No language interpreter was used.  Illness Location:  Bilateral leg swelling Severity:  Moderate Onset quality:  Gradual Duration:  5 days Timing:  Constant Progression:  Worsening Chronicity:  New Associated symptoms: fatigue and shortness of breath   Associated symptoms: no abdominal pain, no chest pain, no congestion, no cough, no diarrhea, no headaches and no rash    HPI Comments: Randy Le is a 57 y.o. male who presents to the Emergency Department complaining of bilateral leg swelling he guesses started about 4-5 days ago.  He describes this overwhelming feeling of weakness and loss of energy that has gradually gotten worse over the last 5 weeks to the point that he has to force himself to eat. He also claims to have lost 25 lbs. He states a slight SOB as well. He states he has been f/u with his PCP regarding the CC and was seen by him today before being instructed to come to the ER. Patient has been referred to a pulmonologist and a GI but has been unable to see either due to overbooking.  He has previously been diagnosed with GERB and COPD.  He denies any associated pain.    Past Medical History  Diagnosis Date  . Hypertension   . Seizures   . Reflux    Past Surgical History  Procedure Laterality Date  . Hernia repair    . Laparotomy  04/08/2011    Procedure: EXPLORATORY LAPAROTOMY;  Surgeon: Dalia Heading;  Location: [facility];  Service: General;  Laterality: N/A;  Exploratory Laparotomy with Partial Small Bowel Resection  . Small intestiine surgery    . External ear surgery      History reviewed. No pertinent family history. History  Substance Use Topics  . Smoking status: Current Every Day Smoker -- 1.00 packs/day    Types: Cigarettes  . Smokeless tobacco: Not on file  . Alcohol Use: 0.0 oz/week    3-4 Cans of beer per week     Comment: daily    Review of Systems  Constitutional: Positive for appetite change, fatigue and unexpected weight change (-25 lbs).  HENT: Negative for congestion, ear discharge and sinus pressure.   Eyes: Negative for discharge.  Respiratory: Positive for shortness of breath. Negative for cough.   Cardiovascular: Positive for leg swelling (bilaterally). Negative for chest pain.  Gastrointestinal: Negative for abdominal pain and diarrhea.  Genitourinary: Negative for frequency and hematuria.  Musculoskeletal: Negative for back pain.  Skin: Negative for rash.  Neurological: Negative for seizures and headaches.  Psychiatric/Behavioral: Negative for hallucinations.  All other systems reviewed and are negative.    Allergies  Review of patient's allergies indicates no known allergies.  Home Medications   Current Outpatient Rx  Name  Route  Sig  Dispense  Refill  . metoprolol (TOPROL-XL) 100 MG 24 hr tablet   Oral   Take 100 mg by mouth daily.           . Multiple Vitamin (MULTIVITAMIN) capsule   Oral   Take 1 capsule by mouth daily.           Marland Kitchen  oxyCODONE-acetaminophen (PERCOCET) 7.5-325 MG per tablet      1 - 2 tablets po q4hrs prn pain   50 tablet   0   . pantoprazole (PROTONIX) 40 MG tablet   Oral   Take 1 tablet (40 mg total) by mouth daily.   30 tablet   1    Triage Vitals: BP 118/75  Pulse 95  Temp(Src) 97.7 F (36.5 C) (Oral)  Resp 20  Ht 5\' 8"  (1.727 m)  Wt 123 lb (55.792 kg)  BMI 18.71 kg/m2  SpO2 100%  Physical Exam  Nursing note and vitals reviewed. Constitutional: He is oriented to person, place, and time. No distress.  Cachectic  HENT:  Head: Normocephalic and atraumatic.  Dry MM   Eyes: Conjunctivae and EOM are normal. No scleral icterus.  Neck: Neck supple. No thyromegaly present.  Cardiovascular: Normal rate and regular rhythm.  Exam reveals no gallop and no friction rub.   No murmur heard. Pulmonary/Chest: Effort normal and breath sounds normal. No stridor. He has no wheezes. He has no rales. He exhibits no tenderness.  Abdominal: Bowel sounds are normal. He exhibits no distension. There is no tenderness. There is no rebound.  Hepatomegaly  Musculoskeletal: Normal range of motion. He exhibits edema (2+ from the knees distally to bilateral legs).  Lymphadenopathy:    He has no cervical adenopathy.  Neurological: He is alert and oriented to person, place, and time. He exhibits normal muscle tone. Coordination normal.  Skin: Skin is warm and dry. No rash noted. No erythema.  Psychiatric: He has a normal mood and affect. His behavior is normal.    ED Course  Procedures (including critical care time) Medications  pantoprazole (PROTONIX) injection 40 mg (40 mg Intravenous Given 08/22/13 1647)    DIAGNOSTIC STUDIES: Oxygen Saturation is 100% on room air, normal by my interpretation.    COORDINATION OF CARE: 4:21 PM-Discussed treatment plan which includes UA, CMP, CBC with pt at bedside and pt agreed to plan.   6:47 PM: Patient has chosen to be admitted to the hospital and will have scope performed to further diagnose.   Labs Review Labs Reviewed  CBC WITH DIFFERENTIAL - Abnormal; Notable for the following:    RBC 3.74 (*)    Hemoglobin 12.5 (*)    HCT 35.6 (*)    All other components within normal limits  COMPREHENSIVE METABOLIC PANEL - Abnormal; Notable for the following:    Sodium 130 (*)    Chloride 92 (*)    Glucose, Bld 108 (*)    Total Protein 5.5 (*)    Albumin 2.4 (*)    GFR calc non Af Amer 81 (*)    All other components within normal limits  LIPASE, BLOOD - Abnormal; Notable for the following:    Lipase 61 (*)    All other components  within normal limits  PRO B NATRIURETIC PEPTIDE  URINALYSIS, ROUTINE W REFLEX MICROSCOPIC   Imaging Review Dg Abd Acute W/chest  08/22/2013   CLINICAL DATA:  Abdominal pain and lower extremity swelling. History of small bowel resection. Smoker and hypertension.  EXAM: ACUTE ABDOMEN SERIES (ABDOMEN 2 VIEW & CHEST 1 VIEW)  COMPARISON:  CT 08/13/2013.  Plain film chest 08/02/2013.  FINDINGS: Frontal view of the chest demonstrates midline trachea. Normal heart size. Aortic atherosclerosis. No pleural effusion or pneumothorax. Hyperinflation/COPD. Clear lungs.  Abdominal films demonstrate no free intraperitoneal air or significant air-fluid levels on upright positioning. Paucity of small bowel gas on supine imaging. No  significant distal gas identified. No abnormal abdominal calcifications. The right-sided abdominal calcification is likely within the enteric stream, when correlated with recent CT.  IMPRESSION: Nonspecific paucity of abdominal pelvic bowel gas. This could relate to ascites, given the appearance of the abdomen on prior CT. Fluid-filled bowel loops cannot be excluded.  No free intraperitoneal air.  Hyperinflation/COPD, without acute cardiopulmonary disease.   Electronically Signed   By: Jeronimo Greaves M.D.   On: 08/22/2013 17:21    EKG Interpretation   None       MDM  The chart was scribed for me under my direct supervision.  I personally performed the history, physical, and medical decision making and all procedures in the evaluation of this patient.Randy Lennert, MD 08/22/13 Ernestina [place]

## 2013-08-22 NOTE — H&P (Signed)
[facility] History and Physical  Randy Le VHQ:[id number] DOB: 1956-07-27 DOA: 08/22/2013   PCP: Kirk Ruths, MD  Specialists: Dr. Karilyn Cota is his gastroenterologist  Chief Complaint: Weight loss, severe acid reflux, generalized fatigue and leg swelling  HPI: Randy Le is a 57 y.o. male with a past medical history of hypertension, who was recently diagnosed with COPD, but is not on any medications as yet. He's comes in to the hospital with complaints of weight loss of about 25 pounds over the last 5 weeks. He has been having severe acid reflux over the same duration. This has been getting progressively worse. He has been unable to lie down and sleep at night. He has very poor appetite. However, he denies any abdominal pain. Denies any nausea, vomiting, per se, however, does have regurgitation of gastric fluids. He has noticed dark stools, but denies any black stools or any blood in the stools. He's also noticed the leg swelling in both his legs for the last 5 days. Denies any fever or chills. He's never had an endoscopy. About two and half years ago he had small bowel obstruction due to ischemia for which he required surgical intervention. He has had a colonoscopy in the past.  Home Medications: Prior to Admission medications   Medication Sig Start Date End Date Taking? Authorizing Provider  metoprolol (TOPROL-XL) 100 MG 24 hr tablet Take 100 mg by mouth every morning.    Yes Historical Provider, MD  Multiple Vitamin (MULTIVITAMIN) capsule Take 1 capsule by mouth daily.     Yes Historical Provider, MD  NEXIUM 40 MG capsule Take 40 mg by mouth daily. 08/14/13  Yes Historical Provider, MD    Allergies: No Known Allergies  Past Medical History: Past Medical History  Diagnosis Date  . Hypertension   . Reflux     Past Surgical History  Procedure Laterality Date  . Hernia repair    . Laparotomy  04/08/2011    Procedure: EXPLORATORY LAPAROTOMY;  Surgeon: Dalia Heading;   Location: [facility];  Service: General;  Laterality: N/A;  Exploratory Laparotomy with Partial Small Bowel Resection  . Small intestiine surgery    . External ear surgery      Social History: Patient lives in [place] with his wife. He works as a Location manager. He smokes one to 2 packs of cigarettes on a daily basis, but over the last 3, weeks. He's cut down to 8 cigarettes a day. Used to drink beer occasionally, but hasn't had any alcohol in the last 5 weeks.  Family History:  he was unable to mention any medical history in the family.  Review of Systems - History obtained from the patient General ROS: positive for  - fatigue, malaise and weight loss Psychological ROS: negative Ophthalmic ROS: negative ENT ROS: negative Allergy and Immunology ROS: negative Hematological and Lymphatic ROS: negative Endocrine ROS: negative Respiratory ROS: no cough, shortness of breath, or wheezing Cardiovascular ROS: no chest pain or dyspnea on exertion Gastrointestinal ROS: as in hpi Genito-Urinary ROS: no dysuria, trouble voiding, or hematuria Musculoskeletal ROS: negative Neurological ROS: no TIA or stroke symptoms Dermatological ROS: negative  Physical Examination  Filed Vitals:   08/22/13 1600 08/22/13 1833  BP: 118/75 90/70  Pulse: 95 90  Temp: 97.7 F (36.5 C)   TempSrc: Oral   Resp: 20   Height: 5\' 8"  (1.727 m)   Weight: 55.792 kg (123 lb)   SpO2: 100%     General appearance: alert, cooperative, appears  stated age and no distress Head: Normocephalic, without obvious abnormality, atraumatic Eyes: conjunctivae/corneas clear. PERRL, EOM's intact.  Throat: lips, mucosa, and tongue normal; teeth and gums normal Neck: no adenopathy, no carotid bruit, no JVD, supple, symmetrical, trachea midline and thyroid not enlarged, symmetric, no tenderness/mass/nodules Resp: clear to auscultation bilaterally Cardio: regular rate and rhythm, S1, S2 normal, no murmur, click, rub or gallop GI:  soft, non-tender; bowel sounds normal; no masses,  no organomegaly Extremities: 2 plus pitting edema Bil LE Pulses: 2+ and symmetric Skin: Skin color, texture, turgor normal. No rashes or lesions Lymph nodes: Cervical, supraclavicular, and axillary nodes normal. Neurologic: He is alert and oriented x3. No focal neurological deficits are present.  Laboratory Data: Results for orders placed during the hospital encounter of 08/22/13 (from the past 48 hour(s))  CBC WITH DIFFERENTIAL     Status: Abnormal   Collection Time    08/22/13  5:27 PM      Result Value Range   WBC 8.1  4.0 - 10.5 K/uL   RBC 3.74 (*) 4.22 - 5.81 MIL/uL   Hemoglobin 12.5 (*) 13.0 - 17.0 g/dL   HCT 16.1 (*) 09.6 - 04.5 %   MCV 95.2  78.0 - 100.0 fL   MCH 33.4  26.0 - 34.0 pg   MCHC 35.1  30.0 - 36.0 g/dL   RDW 40.9  81.1 - 91.4 %   Platelets 358  150 - 400 K/uL   Neutrophils Relative % 75  43 - 77 %   Neutro Abs 6.1  1.7 - 7.7 K/uL   Lymphocytes Relative 15  12 - 46 %   Lymphs Abs 1.2  0.7 - 4.0 K/uL   Monocytes Relative 10  3 - 12 %   Monocytes Absolute 0.8  0.1 - 1.0 K/uL   Eosinophils Relative 1  0 - 5 %   Eosinophils Absolute 0.1  0.0 - 0.7 K/uL   Basophils Relative 0  0 - 1 %   Basophils Absolute 0.0  0.0 - 0.1 K/uL  COMPREHENSIVE METABOLIC PANEL     Status: Abnormal   Collection Time    08/22/13  5:27 PM      Result Value Range   Sodium 130 (*) 135 - 145 mEq/L   Potassium 3.9  3.5 - 5.1 mEq/L   Chloride 92 (*) 96 - 112 mEq/L   CO2 31  19 - 32 mEq/L   Glucose, Bld 108 (*) 70 - 99 mg/dL   BUN 18  6 - 23 mg/dL   Creatinine, Ser 7.82  0.50 - 1.35 mg/dL   Calcium 8.6  8.4 - 95.6 mg/dL   Total Protein 5.5 (*) 6.0 - 8.3 g/dL   Albumin 2.4 (*) 3.5 - 5.2 g/dL   AST 12  0 - 37 U/L   ALT 10  0 - 53 U/L   Alkaline Phosphatase 48  39 - 117 U/L   Total Bilirubin 0.3  0.3 - 1.2 mg/dL   GFR calc non Af Amer 81 (*) >90 mL/min   GFR calc Af Amer >90  >90 mL/min   Comment: (NOTE)     The eGFR has been  calculated using the CKD EPI equation.     This calculation has not been validated in all clinical situations.     eGFR's persistently <90 mL/min signify possible Chronic Kidney     Disease.  LIPASE, BLOOD     Status: Abnormal   Collection Time    08/22/13  5:27 PM  Result Value Range   Lipase 61 (*) 11 - 59 U/L  PRO B NATRIURETIC PEPTIDE     Status: None   Collection Time    08/22/13  5:27 PM      Result Value Range   Pro B Natriuretic peptide (BNP) 113.8  0 - 125 pg/mL  URINALYSIS, ROUTINE W REFLEX MICROSCOPIC     Status: Abnormal   Collection Time    08/22/13  5:30 PM      Result Value Range   Color, Urine YELLOW  YELLOW   APPearance CLEAR  CLEAR   Specific Gravity, Urine 1.025  1.005 - 1.030   pH 6.0  5.0 - 8.0   Glucose, UA NEGATIVE  NEGATIVE mg/dL   Hgb urine dipstick NEGATIVE  NEGATIVE   Bilirubin Urine SMALL (*) NEGATIVE   Ketones, ur TRACE (*) NEGATIVE mg/dL   Protein, ur NEGATIVE  NEGATIVE mg/dL   Urobilinogen, UA 0.2  0.0 - 1.0 mg/dL   Nitrite NEGATIVE  NEGATIVE   Leukocytes, UA NEGATIVE  NEGATIVE   Comment: MICROSCOPIC NOT DONE ON URINES WITH NEGATIVE PROTEIN, BLOOD, LEUKOCYTES, NITRITE, OR GLUCOSE <1000 mg/dL.    Radiology Reports: Dg Abd Acute W/chest  08/22/2013   CLINICAL DATA:  Abdominal pain and lower extremity swelling. History of small bowel resection. Smoker and hypertension.  EXAM: ACUTE ABDOMEN SERIES (ABDOMEN 2 VIEW & CHEST 1 VIEW)  COMPARISON:  CT 08/13/2013.  Plain film chest 08/02/2013.  FINDINGS: Frontal view of the chest demonstrates midline trachea. Normal heart size. Aortic atherosclerosis. No pleural effusion or pneumothorax. Hyperinflation/COPD. Clear lungs.  Abdominal films demonstrate no free intraperitoneal air or significant air-fluid levels on upright positioning. Paucity of small bowel gas on supine imaging. No significant distal gas identified. No abnormal abdominal calcifications. The right-sided abdominal calcification is likely within  the enteric stream, when correlated with recent CT.  IMPRESSION: Nonspecific paucity of abdominal pelvic bowel gas. This could relate to ascites, given the appearance of the abdomen on prior CT. Fluid-filled bowel loops cannot be excluded.  No free intraperitoneal air.  Hyperinflation/COPD, without acute cardiopulmonary disease.   Electronically Signed   By: Jeronimo Greaves M.D.   On: 08/22/2013 17:21    Problem List  Principal Problem:   Hyponatremia Active Problems:   GERD (gastroesophageal reflux disease)   Weight loss, unintentional   Gastric wall thickening   Dehydration   Tobacco abuse   COPD (chronic obstructive pulmonary disease)   Pedal edema   Assessment: This is a 57 year old, Caucasian male, who presents with weight loss, severe acid reflux and Bilateral lower extremity edema. He had a CT scan of his abdomen and pelvis done a few days ago, which revealed gastric wall thickening. No obvious obstruction was noted. His presentation is very concerning for malignancy. Lower extremity edema could be due to hypoalbuminemia. However, cannot rule out DVT.  Plan: #1 severe acid reflux with weight loss and abnormal findings on CT: He'll be admitted as per recommendations of gastroenterology. He'll undergo endoscopy tomorrow morning. He'll be kept n.p.o. past midnight. He will be given PPI and Maalox as needed. Slightly elevated lipase level is noted, however, significance is not clear.  #2 lower extremity edema: Possibly due to hypoalbuminemia, but we will get a venous Doppler to rule out deep venous thrombosis.  #3 history of COPD, and tobacco abuse: He is motivated to quit smoking. Will utilize nicotine patch.  #4 hyponatremia and dehydration: He'll be given IV fluids. Blood pressure is borderline, so, we will  hold his metoprolol for now.  Other abnormalities noted on the CT, which was ordered by his PCP, will be deferred to his primary care physician.  DVT Prophylaxis: Lovenox times  one tonight and then will need to be determined post-endoscopy tomorrow and based on results of venous dopplers. Code Status: Full code Family Communication: Discussed with the patient and his wife  Disposition Plan: Observation. On MedSurg   Further management decisions will depend on results of further testing and patient's response to treatment.  [facility]  [facility] Pager [phone]  If 7PM-7AM, please contact night-coverage [email or web address] Password [facility]  08/22/2013, 8:56 PM

## 2013-08-22 NOTE — ED Notes (Addendum)
Legs swelling, recent wt loss, office says 35 lbs loss.  Sent from [place] office.   Says he can't eat. And ?pulm nodule. Vomiting.

## 2013-08-23 ENCOUNTER — Encounter (HOSPITAL_COMMUNITY): Payer: Self-pay | Admitting: *Deleted

## 2013-08-23 ENCOUNTER — Observation Stay (HOSPITAL_COMMUNITY): Payer: Federal, State, Local not specified - PPO

## 2013-08-23 ENCOUNTER — Encounter (HOSPITAL_COMMUNITY)
Admission: EM | Disposition: A | Discharge status: Home Health | Payer: Self-pay | Source: Home / Self Care | Attending: Internal Medicine

## 2013-08-23 ENCOUNTER — Encounter (HOSPITAL_COMMUNITY): Payer: Federal, State, Local not specified - PPO

## 2013-08-23 ENCOUNTER — Inpatient Hospital Stay (HOSPITAL_COMMUNITY): Payer: Federal, State, Local not specified - PPO

## 2013-08-23 DIAGNOSIS — R634 Abnormal weight loss: Secondary | ICD-10-CM

## 2013-08-23 DIAGNOSIS — D378 Neoplasm of uncertain behavior of other specified digestive organs: Secondary | ICD-10-CM

## 2013-08-23 DIAGNOSIS — D371 Neoplasm of uncertain behavior of stomach: Secondary | ICD-10-CM

## 2013-08-23 DIAGNOSIS — R109 Unspecified abdominal pain: Secondary | ICD-10-CM

## 2013-08-23 DIAGNOSIS — E860 Dehydration: Secondary | ICD-10-CM

## 2013-08-23 DIAGNOSIS — K311 Adult hypertrophic pyloric stenosis: Secondary | ICD-10-CM

## 2013-08-23 DIAGNOSIS — R933 Abnormal findings on diagnostic imaging of other parts of digestive tract: Secondary | ICD-10-CM

## 2013-08-23 DIAGNOSIS — D375 Neoplasm of uncertain behavior of rectum: Secondary | ICD-10-CM

## 2013-08-23 DIAGNOSIS — K319 Disease of stomach and duodenum, unspecified: Secondary | ICD-10-CM

## 2013-08-23 DIAGNOSIS — E43 Unspecified severe protein-calorie malnutrition: Secondary | ICD-10-CM | POA: Diagnosis present

## 2013-08-23 DIAGNOSIS — K219 Gastro-esophageal reflux disease without esophagitis: Secondary | ICD-10-CM

## 2013-08-23 DIAGNOSIS — J449 Chronic obstructive pulmonary disease, unspecified: Secondary | ICD-10-CM

## 2013-08-23 DIAGNOSIS — C169 Malignant neoplasm of stomach, unspecified: Secondary | ICD-10-CM

## 2013-08-23 HISTORY — PX: ESOPHAGOGASTRODUODENOSCOPY: SHX5428

## 2013-08-23 LAB — COMPREHENSIVE METABOLIC PANEL
AST: 14 U/L (ref 0–37)
Albumin: 2.3 g/dL — ABNORMAL LOW (ref 3.5–5.2)
BUN: 12 mg/dL (ref 6–23)
Calcium: 8.3 mg/dL — ABNORMAL LOW (ref 8.4–10.5)
Chloride: 97 mEq/L (ref 96–112)
Creatinine, Ser: 0.89 mg/dL (ref 0.50–1.35)
GFR calc Af Amer: >90 mL/min (ref >90)
Glucose, Bld: 103 mg/dL — ABNORMAL HIGH (ref 70–99)
Sodium: 133 mEq/L — ABNORMAL LOW (ref 135–145)
Total Bilirubin: 0.3 mg/dL (ref 0.3–1.2)

## 2013-08-23 LAB — PROTIME-INR
INR: 1.1 (ref 0.00–1.49)
Prothrombin Time: 14 seconds (ref 11.6–15.2)

## 2013-08-23 LAB — GLUCOSE, CAPILLARY

## 2013-08-23 LAB — CBC
MCH: 33.1 pg (ref 26.0–34.0)
MCV: 95.4 fL (ref 78.0–100.0)
Platelets: 387 10*3/uL (ref 150–400)
RBC: 4.11 MIL/uL — ABNORMAL LOW (ref 4.22–5.81)
WBC: 9.2 10*3/uL (ref 4.0–10.5)

## 2013-08-23 LAB — MRSA PCR SCREENING: MRSA by PCR: NEGATIVE

## 2013-08-23 SURGERY — EGD (ESOPHAGOGASTRODUODENOSCOPY)
Anesthesia: Moderate Sedation

## 2013-08-23 MED ORDER — SODIUM CHLORIDE 0.9 % IJ SOLN
10.0000 mL | INTRAMUSCULAR | Status: DC | PRN
  Administered 2013-08-23: 30 mL
  Administered 2013-08-29 – 2013-09-11 (×2): 10 mL

## 2013-08-23 MED ORDER — PIPERACILLIN-TAZOBACTAM 3.375 G IVPB
3.3750 g | Freq: Three times a day (TID) | INTRAVENOUS | Status: DC
  Administered 2013-08-23 – 2013-08-30 (×21): 3.375 g via INTRAVENOUS
  Filled 2013-08-23 (×28): qty 50

## 2013-08-23 MED ORDER — BUTAMBEN-TETRACAINE-BENZOCAINE 2-2-14 % EX AERO
INHALATION_SPRAY | CUTANEOUS | Status: DC | PRN
  Administered 2013-08-23 (×2): 2 via TOPICAL

## 2013-08-23 MED ORDER — ATROPINE SULFATE 1 MG/ML IJ SOLN
INTRAMUSCULAR | Status: AC
  Filled 2013-08-23: qty 1

## 2013-08-23 MED ORDER — ATROPINE SULFATE 1 MG/ML IJ SOLN
INTRAMUSCULAR | Status: DC | PRN
  Administered 2013-08-23: .5 mg via INTRAVENOUS

## 2013-08-23 MED ORDER — CHLORHEXIDINE GLUCONATE 0.12 % MT SOLN
15.0000 mL | Freq: Two times a day (BID) | OROMUCOSAL | Status: DC
  Administered 2013-08-23 – 2013-09-16 (×44): 15 mL via OROMUCOSAL
  Filled 2013-08-23 (×51): qty 15

## 2013-08-23 MED ORDER — MEPERIDINE HCL 50 MG/ML IJ SOLN
INTRAMUSCULAR | Status: AC
  Filled 2013-08-23: qty 1

## 2013-08-23 MED ORDER — MEPERIDINE HCL 50 MG/ML IJ SOLN
INTRAMUSCULAR | Status: DC | PRN
  Administered 2013-08-23: 25 mg via INTRAVENOUS

## 2013-08-23 MED ORDER — SODIUM CHLORIDE 0.9 % IV SOLN: INTRAVENOUS | Status: DC

## 2013-08-23 MED ORDER — FAT EMULSION 20 % IV EMUL
250.0000 mL | INTRAVENOUS | Status: AC
  Administered 2013-08-23: 250 mL via INTRAVENOUS
  Filled 2013-08-23: qty 250

## 2013-08-23 MED ORDER — ALBUTEROL SULFATE (5 MG/ML) 0.5% IN NEBU
2.5000 mg | INHALATION_SOLUTION | Freq: Four times a day (QID) | RESPIRATORY_TRACT | Status: DC
  Administered 2013-08-23 – 2013-08-26 (×12): 2.5 mg via RESPIRATORY_TRACT
  Filled 2013-08-23 (×13): qty 0.5

## 2013-08-23 MED ORDER — STERILE WATER FOR IRRIGATION IR SOLN
Status: DC | PRN
  Administered 2013-08-23: 12:00:00

## 2013-08-23 MED ORDER — BIOTENE DRY MOUTH MT LIQD
15.0000 mL | Freq: Two times a day (BID) | OROMUCOSAL | Status: DC
  Administered 2013-08-24 – 2013-09-15 (×40): 15 mL via OROMUCOSAL

## 2013-08-23 MED ORDER — MIDAZOLAM HCL 5 MG/5ML IJ SOLN
INTRAMUSCULAR | Status: DC | PRN
  Administered 2013-08-23 (×2): 2 mg via INTRAVENOUS

## 2013-08-23 MED ORDER — PANTOPRAZOLE SODIUM 40 MG IV SOLR
40.0000 mg | Freq: Two times a day (BID) | INTRAVENOUS | Status: DC
  Administered 2013-08-23 – 2013-09-16 (×48): 40 mg via INTRAVENOUS
  Filled 2013-08-23 (×50): qty 40

## 2013-08-23 MED ORDER — MIDAZOLAM HCL 5 MG/5ML IJ SOLN
INTRAMUSCULAR | Status: AC
  Filled 2013-08-23: qty 10

## 2013-08-23 MED ORDER — PHENOL 1.4 % MT LIQD
1.0000 | OROMUCOSAL | Status: DC | PRN
  Administered 2013-08-23: 1 via OROMUCOSAL
  Filled 2013-08-23 (×2): qty 177

## 2013-08-23 MED ORDER — SODIUM CHLORIDE 0.9 % IJ SOLN
10.0000 mL | Freq: Two times a day (BID) | INTRAMUSCULAR | Status: DC
  Administered 2013-08-23: 10 mL
  Administered 2013-08-24: 40 mL
  Administered 2013-08-25 – 2013-08-27 (×5): 10 mL
  Administered 2013-08-28: 20 mL
  Administered 2013-08-29 – 2013-09-14 (×28): 10 mL
  Administered 2013-09-14: 30 mL
  Administered 2013-09-15: 40 mL
  Administered 2013-09-16: 30 mL

## 2013-08-23 MED ORDER — SODIUM CHLORIDE 0.9 % IV SOLN
INTRAVENOUS | Status: DC
  Administered 2013-08-23 – 2013-08-31 (×6): via INTRAVENOUS

## 2013-08-23 MED ORDER — INSULIN ASPART 100 UNIT/ML ~~LOC~~ SOLN
0.0000 [IU] | Freq: Four times a day (QID) | SUBCUTANEOUS | Status: DC
  Administered 2013-08-24 (×3): 2 [IU] via SUBCUTANEOUS
  Administered 2013-08-24 – 2013-09-12 (×30): 1 [IU] via SUBCUTANEOUS

## 2013-08-23 MED ORDER — TRACE MINERALS CR-CU-F-FE-I-MN-MO-SE-ZN IV SOLN
INTRAVENOUS | Status: AC
  Administered 2013-08-23: 17:00:00 via INTRAVENOUS
  Filled 2013-08-23: qty 2000

## 2013-08-23 NOTE — Consult Note (Signed)
Reason for Consult: weight loss Referring Physician: Hospitalist  Randy Le is an 57 y.o. male.  HPI:  Admitted thru the ED yesterday. He tells me he has been "sick' for 5 weeks.  He has lost at least 25 pounds over a 5 week period.  He is forcing himself to eat. He cannot more than a half a bowl of food. He has been having acid reflux on a daily basis.  He has been followed by [facility] and underwent a CT abdomen and chest which revealed a prominent gastric antral wall thickening. He denies having any abdominal pain.  He does c/o SOB. He is unable to lie down at due to the acid reflux. He has a BM about one BM daily and may void once a day. His stools are dark brown. He denies hematemesis. He does have nausea every night. He has been smoking 1-2 packs of cigarettes a day x 30 yrs. He says today he ha quit smoking.  Recently diagnosed with COPD. 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  In 2007 he underwent an screening colonoscopy: IMPRESSION:  1. Internal hemorrhoids, otherwise normal rectum.  2. Normal colon.   No NSAIDs.  08/09/2013  Ct abdomen/pelvis with CM:  1. Prominent gastric antral wall thickening may be due to tumor or  inflammation. Stomach body is distended as is the esophagus.  Patulous gastroesophageal junction with reflux.  2. Diffuse 3rd spacing of fluid in the subcutaneous and mesenteric  adipose tissue.  3. Cachexia  4. Airway thickening in the lower lobes, favoring bronchitis.  5. A 4 mm nodule in the left upper lobe. If the patient is at high  risk for bronchogenic carcinoma, follow-up chest CT at 1 year is  recommended. If the patient is at low risk, no follow-up is needed.  This recommendation follows the consensus statement: Guidelines for  Management of Small Pulmonary Nodules Detected on CT Scans: A  Statement from the Fleischner Society  as published in Radiology  2005; 237:395-400.  6. Stable adrenal adenomas.  7. Moderate ascites.  8. Prominent prostate gland  9. Lower lumbar facet arthropathy.    Past Medical History  Diagnosis Date  . Hypertension   . Reflux     Past Surgical History  Procedure Laterality Date  . Hernia repair    . Laparotomy  04/08/2011    Procedure: EXPLORATORY LAPAROTOMY;  Surgeon: Dalia Heading;  Location: [facility];  Service: General;  Laterality: N/A;  Exploratory Laparotomy with Partial Small Bowel Resection  . Small intestiine surgery    . External ear surgery      History reviewed. No pertinent family history. Works as a Chartered certified accountant. Married. No children. Social History:  reports that he has been smoking Cigarettes.  He has been smoking about 1.00 pack per day. He does not have any smokeless tobacco history on file. He reports that he drinks alcohol. He reports that he does not use illicit drugs.  Allergies: No Known Allergies  Medications: I have reviewed the patient's current medications.  Results for orders placed during the hospital encounter of 08/22/13 (from the past 48 hour(s))  CBC WITH DIFFERENTIAL     Status: Abnormal   Collection Time    08/22/13  5:27 PM      Result Value Range   WBC 8.1  4.0 - 10.5 K/uL   RBC 3.74 (*) 4.22 - 5.81 MIL/uL  Hemoglobin 12.5 (*) 13.0 - 17.0 g/dL   HCT 65.7 (*) 84.6 - 96.2 %   MCV 95.2  78.0 - 100.0 fL   MCH 33.4  26.0 - 34.0 pg   MCHC 35.1  30.0 - 36.0 g/dL   RDW 95.2  84.1 - 32.4 %   Platelets 358  150 - 400 K/uL   Neutrophils Relative % 75  43 - 77 %   Neutro Abs 6.1  1.7 - 7.7 K/uL   Lymphocytes Relative 15  12 - 46 %   Lymphs Abs 1.2  0.7 - 4.0 K/uL   Monocytes Relative 10  3 - 12 %   Monocytes Absolute 0.8  0.1 - 1.0 K/uL   Eosinophils Relative 1  0 - 5 %   Eosinophils Absolute 0.1  0.0 - 0.7 K/uL   Basophils Relative 0  0 - 1 %   Basophils Absolute 0.0  0.0 - 0.1 K/uL  COMPREHENSIVE METABOLIC PANEL     Status: Abnormal    Collection Time    08/22/13  5:27 PM      Result Value Range   Sodium 130 (*) 135 - 145 mEq/L   Potassium 3.9  3.5 - 5.1 mEq/L   Chloride 92 (*) 96 - 112 mEq/L   CO2 31  19 - 32 mEq/L   Glucose, Bld 108 (*) 70 - 99 mg/dL   BUN 18  6 - 23 mg/dL   Creatinine, Ser 4.01  0.50 - 1.35 mg/dL   Calcium 8.6  8.4 - 02.7 mg/dL   Total Protein 5.5 (*) 6.0 - 8.3 g/dL   Albumin 2.4 (*) 3.5 - 5.2 g/dL   AST 12  0 - 37 U/L   ALT 10  0 - 53 U/L   Alkaline Phosphatase 48  39 - 117 U/L   Total Bilirubin 0.3  0.3 - 1.2 mg/dL   GFR calc non Af Amer 81 (*) >90 mL/min   GFR calc Af Amer >90  >90 mL/min   Comment: (NOTE)     The eGFR has been calculated using the CKD EPI equation.     This calculation has not been validated in all clinical situations.     eGFR's persistently <90 mL/min signify possible Chronic Kidney     Disease.  LIPASE, BLOOD     Status: Abnormal   Collection Time    08/22/13  5:27 PM      Result Value Range   Lipase 61 (*) 11 - 59 U/L  PRO B NATRIURETIC PEPTIDE     Status: None   Collection Time    08/22/13  5:27 PM      Result Value Range   Pro B Natriuretic peptide (BNP) 113.8  0 - 125 pg/mL  URINALYSIS, ROUTINE W REFLEX MICROSCOPIC     Status: Abnormal   Collection Time    08/22/13  5:30 PM      Result Value Range   Color, Urine YELLOW  YELLOW   APPearance CLEAR  CLEAR   Specific Gravity, Urine 1.025  1.005 - 1.030   pH 6.0  5.0 - 8.0   Glucose, UA NEGATIVE  NEGATIVE mg/dL   Hgb urine dipstick NEGATIVE  NEGATIVE   Bilirubin Urine SMALL (*) NEGATIVE   Ketones, ur TRACE (*) NEGATIVE mg/dL   Protein, ur NEGATIVE  NEGATIVE mg/dL   Urobilinogen, UA 0.2  0.0 - 1.0 mg/dL   Nitrite NEGATIVE  NEGATIVE   Leukocytes, UA NEGATIVE  NEGATIVE   Comment:  MICROSCOPIC NOT DONE ON URINES WITH NEGATIVE PROTEIN, BLOOD, LEUKOCYTES, NITRITE, OR GLUCOSE <1000 mg/dL.  COMPREHENSIVE METABOLIC PANEL     Status: Abnormal   Collection Time    08/23/13  5:44 AM      Result Value Range    Sodium 133 (*) 135 - 145 mEq/L   Potassium 3.9  3.5 - 5.1 mEq/L   Chloride 97  96 - 112 mEq/L   CO2 30  19 - 32 mEq/L   Glucose, Bld 103 (*) 70 - 99 mg/dL   BUN 12  6 - 23 mg/dL   Creatinine, Ser 1.61  0.50 - 1.35 mg/dL   Calcium 8.3 (*) 8.4 - 10.5 mg/dL   Total Protein 5.1 (*) 6.0 - 8.3 g/dL   Albumin 2.3 (*) 3.5 - 5.2 g/dL   AST 14  0 - 37 U/L   ALT 11  0 - 53 U/L   Alkaline Phosphatase 47  39 - 117 U/L   Total Bilirubin 0.3  0.3 - 1.2 mg/dL   GFR calc non Af Amer >90  >90 mL/min   GFR calc Af Amer >90  >90 mL/min   Comment: (NOTE)     The eGFR has been calculated using the CKD EPI equation.     This calculation has not been validated in all clinical situations.     eGFR's persistently <90 mL/min signify possible Chronic Kidney     Disease.  CBC     Status: Abnormal   Collection Time    08/23/13  5:44 AM      Result Value Range   WBC 9.2  4.0 - 10.5 K/uL   RBC 4.11 (*) 4.22 - 5.81 MIL/uL   Hemoglobin 13.6  13.0 - 17.0 g/dL   HCT 09.6  04.5 - 40.9 %   MCV 95.4  78.0 - 100.0 fL   MCH 33.1  26.0 - 34.0 pg   MCHC 34.7  30.0 - 36.0 g/dL   RDW 81.1  91.4 - 78.2 %   Platelets 387  150 - 400 K/uL  PROTIME-INR     Status: None   Collection Time    08/23/13  5:44 AM      Result Value Range   Prothrombin Time 14.0  11.6 - 15.2 seconds   INR 1.10  0.00 - 1.49    Dg Abd Acute W/chest  08/22/2013   CLINICAL DATA:  Abdominal pain and lower extremity swelling. History of small bowel resection. Smoker and hypertension.  EXAM: ACUTE ABDOMEN SERIES (ABDOMEN 2 VIEW & CHEST 1 VIEW)  COMPARISON:  CT 08/13/2013.  Plain film chest 08/02/2013.  FINDINGS: Frontal view of the chest demonstrates midline trachea. Normal heart size. Aortic atherosclerosis. No pleural effusion or pneumothorax. Hyperinflation/COPD. Clear lungs.  Abdominal films demonstrate no free intraperitoneal air or significant air-fluid levels on upright positioning. Paucity of small bowel gas on supine imaging. No significant  distal gas identified. No abnormal abdominal calcifications. The right-sided abdominal calcification is likely within the enteric stream, when correlated with recent CT.  IMPRESSION: Nonspecific paucity of abdominal pelvic bowel gas. This could relate to ascites, given the appearance of the abdomen on prior CT. Fluid-filled bowel loops cannot be excluded.  No free intraperitoneal air.  Hyperinflation/COPD, without acute cardiopulmonary disease.   Electronically Signed   By: Jeronimo Greaves M.D.   On: 08/22/2013 17:21    ROS Blood pressure 122/75, pulse 95, temperature 97.9 F (36.6 C), temperature source Oral, resp. rate 18, height 5\' 8"  (1.727  m), weight 130 lb 3.2 oz (59.058 kg), SpO2 90.00%. Physical Exam Alert and oriented. Skin warm and dry. Oral mucosa is dry  . Sclera anicteric, conjunctivae is pink. Thyroid not enlarged. No cervical lymphadenopathy. Bilateral rhonchi. Heart regular rate and rhythm, bounding.  Abdomen is soft. Mass felt to rt mid-upper abdomen.  Swelling to left mid abdomen.  Bowel sounds are positive.  2+ edema to ankles.   Assessment/Plan: Severe GERD. He ha an abnormal CT with gastric wall thickening. Mass felt rt  abdomen.  Fullness left abdomen.  Gastric carcinoma needs to be ruled out.  EGD with Dr Karilyn Cota today.  SETZER,TERRI W 08/23/2013, 8:23 AM     GI attending note; Patient interviewed and examined. Patient presents with a five-week history of burning abdominal pain intractable heartburn nocturnal emesis anorexia and weight loss. He also has developed lower extremity edema over the last few days. On workup was found to have very low serum albumin and CT reveals dilated stomach full of liquid or food. There is thickening to gastric antrum mesenteric and subcutaneous edema mild upper abdominal ascites. Chest CT reveals a superficial dilation small mediastinal lymph nodes felt to be nonpathologic prominence centrally lobular emphysema and single 4 mm nodule that left  upper lobe. Abdominal examination he has fullness across upper abdomen and nodular mass in the right upper quadrant. Patient's illness is very suspicious for gastric carcinoma. Patient is agreeable to proceeding with diagnostic EGD.

## 2013-08-23 NOTE — Op Note (Signed)
EGD PROCEDURE REPORT  PATIENT:  Randy Le  MR#:  [id number] Birthdate:  1956-04-04, 57 y.o., male Endoscopist:  Dr. Malissa Hippo, MD Referred By:  Dr. Colette Ribas, MD  Procedure Date: 08/23/2013  Procedure:   EGD  Indications:  Patient is 57 year old Caucasian male who presents with 5 week history of heartburn burning abdominal pain and on workup found to have distended stomach thickening to gastric antrum. He is undergoing diagnostic EGD.            Informed Consent:  The risks, benefits, alternatives & imponderables which include, but are not limited to, bleeding, infection, perforation, drug reaction and potential missed lesion have been reviewed.  The potential for biopsy, lesion removal, esophageal dilation, etc. have also been discussed.  Questions have been answered.  All parties agreeable.  Please see history & physical in medical record for more information.  Medications:  Demerol 25  mg IV Versed 4 mg IV Atropine oh 0.5 mg IV  Cetacaine spray topically for oropharyngeal anesthesia  Description of procedure:  The endoscope was introduced through the mouth and advanced to the second portion of the duodenum without difficulty or limitations. The mucosal surfaces were surveyed very carefully during advancement of the scope and upon withdrawal.  Findings:  Esophagus:  Mucosa of the proximal segment the esophagus was normal. Mucosa of distal half was ulcerated. NG tube was in place. Stomach:  Stomach full of food debris. Examination there were is limited. Mucosa at gastric body and fundus and cardia was unremarkable. 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 I was able to pass the scope across the pyloric channel with some difficulty. Duodenum:  Normal bulbar and post bulbar mucosa.  Therapeutic/Diagnostic Maneuvers Performed:  Multiple biopsies are taken from antral mucosa.  Complications:  Patient was refluxing gastric contents into  hypopharynx. Patient's saturation dropped. He was placed on Ventimask and O2 sat came above 90%eventually NG tube had to be placed for gastric decompression before procedure could be completed. 2-1/2 L of gastric contents were removed NG suction before procedure could be completed .  Impression: Ulcerative esophagitis(secondary to gastric outlet obstruction0.  large stomach with food debris . Abnormal antral mucosa with nodularity and friability and pyloric channel stenosis. Multiple biopsies taken. 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    Recommendations:  Patient will be transferred to ICU to monitor his respiratory status. Zosyn 3.375 g IV every 6 hours. Increase pantoprazole to 40 mg IV every 12 hours. Consider PIC line for parenteral nutrition.  Kathrene Sinopoli U  08/23/2013  12:47 PM  CC: Dr. Kirk Ruths, MD & Dr. Bonnetta Barry ref. provider found

## 2013-08-23 NOTE — Progress Notes (Signed)
PARENTERAL NUTRITION CONSULT NOTE  Pharmacy Consult for TPN Indication: Intolerance to enteral feeding, ulcerative esophagitis(secondary to gastric outlet obstruction).  No Known Allergies  Patient Measurements: Height: 5\' 8"  (172.7 cm) Weight: 130 lb 3.2 oz (59.058 kg) IBW/kg (Calculated) : 68.4  Vital Signs: Temp: 98 F (36.7 C) (12/05 1052) Temp src: Oral (12/05 1052) BP: 93/62 mmHg (12/05 1345) Pulse Rate: 109 (12/05 1345) Intake/Output from previous day: 12/04 0701 - 12/05 0700 In: 150 [P.O.:150] Out: -  Intake/Output from this shift: Total I/O In: 951.7 [I.V.:951.7] Out: 2500 [Emesis/NG output:2500]  Labs:  Recent Labs  08/22/13 1727 08/23/13 0544  WBC 8.1 9.2  HGB 12.5* 13.6  HCT 35.6* 39.2  PLT 358 387  INR  --  1.10     Recent Labs  08/22/13 1727 08/23/13 0544  NA 130* 133*  K 3.9 3.9  CL 92* 97  CO2 31 30  GLUCOSE 108* 103*  BUN 18 12  CREATININE 1.00 0.89  CALCIUM 8.6 8.3*  PROT 5.5* 5.1*  ALBUMIN 2.4* 2.3*  AST 12 14  ALT 10 11  ALKPHOS 48 47  BILITOT 0.3 0.3   Estimated Creatinine Clearance: 75.6 ml/min (by C-G formula based on Cr of 0.89).  No results found for this basename: GLUCAP,  in the last 72 hours  Medical History: Past Medical History  Diagnosis Date  . Hypertension   . Reflux    Medications:  Scheduled:  . albuterol  2.5 mg Nebulization QID  . atropine      . enoxaparin (LOVENOX) injection  40 mg Subcutaneous Q24H  . meperidine      . midazolam      . nicotine  14 mg Transdermal Daily  . pantoprazole (PROTONIX) IV  40 mg Intravenous Q12H  . piperacillin-tazobactam  3.375 g Intravenous Q8H    Insulin Requirements in the past 24 hours:  N/A  Current Nutrition:  NPA  Assessment: Okay for Protocol, chemistry panel reviewed.  Mild hyponatremia and hypocalcemia.  Alb 2.3. EGD performed today showed ulcerative esophagitis(secondary to gastric outlet obstruction).  Dietician w/u pending.  Nutritional Goals:   2000 kCal, 90-100 grams of protein per day  Plan:  Clinimix E 5/15 starting @ 50 ml/hr. Lipids @ 10 ml/hr Lipids, MVI, trace elements daily. Labs per protocol.  Lamonte Richer R 08/23/2013,3:26 PM

## 2013-08-23 NOTE — Progress Notes (Signed)
UR completed 

## 2013-08-23 NOTE — Progress Notes (Signed)
Patient is drowsy but able to answer questions. He's not having chest or abdominal pain. His abdomen Noll is soft and upper abdominal masses easy palpable it's hard nodular but not tender. Patient is now on 3 L nasal O2. PICC line has been placed and he'll be starting TPN this evening. Please note biopsy results would not be available until 08/26/2013. Dr. Jena Gauss will seeing patient over the weekend.

## 2013-08-23 NOTE — Progress Notes (Signed)
Patient ID: Randy Le, male   DOB: March 13, 1956, 57 y.o.   MRN: [id number] In my consult, I stated mass left lower abdomen. Mass is actually in the rt abdomen with fullness in left abdomen.

## 2013-08-23 NOTE — Progress Notes (Signed)
PT Cancellation Note  Patient Details Name: Randy Le MRN: [id number] DOB: 1956/03/26   Cancelled Treatment:    Reason Eval/Treat Not Completed: Medical issues which prohibited therapy We had received a request for a PT evaluation.  Since that time, pt has been transferred to ICU due to respiratory distress during an EGD.  We will have to d/c PT orders at this time due to change in medical status.  Please reorder when medically appropriate if PT is still needed.  Myrlene Broker L 08/23/2013, 4:01 PM

## 2013-08-23 NOTE — Progress Notes (Addendum)
INITIAL NUTRITION ASSESSMENT  DOCUMENTATION CODES Per approved criteria  -Severe malnutrition in the context of acute illness    INTERVENTION: TPN per pharmacy  NUTRITION DIAGNOSIS: Inadequate oral intake related to inability to eat, altered GI funtion as evidenced by NPO status, NG tube placed for gastric decompression.   Goal: Pt to meet >/= 90% of their estimated nutrition needs   Monitor:  Nutrition Support measures, labs and weights  Reason for Assessment: Malnutrition Screen Score = 4  57 y.o. male  Admitting Dx: Hyponatremia Patient Active Problem List   Diagnosis Date Noted  . Abdominal pain 08/22/2013  . GERD (gastroesophageal reflux disease) 08/22/2013  . Weight loss, unintentional 08/22/2013  . Gastric wall thickening 08/22/2013  . Hyponatremia 08/22/2013  . Dehydration 08/22/2013  . Tobacco abuse 08/22/2013  . COPD (chronic obstructive pulmonary disease) 08/22/2013  . Pedal edema 08/22/2013  . Enteritis 04/05/2011   ASSESSMENT: Presented c/o heartburn, abdominal pain on admission. Severe wt loss of 25#,16% over past 5 weeks. Pt is s/p EGD today. NG tube placed for gastric decompression due to 2.5 liters gastric content. Gastric outlet obstruction. Biopsies obtained. PICC line placed. TPN being initiated. Unable to tolerate EN at this time.  Pt meets criteria for severe MALNUTRITION in the context of acute illness (altered GI function) as evidenced by unplanned wt loss 16%, and energy intake </= 50% for >/= 5 days.  Height: Ht Readings from Last 1 Encounters:  08/22/13 5\' 8"  (1.727 m)    Weight: Wt Readings from Last 1 Encounters:  08/22/13 130 lb 3.2 oz (59.058 kg)    Ideal Body Weight: 154# (70 kg)  % Ideal Body Weight: 84%  Wt Readings from Last 10 Encounters:  08/22/13 130 lb 3.2 oz (59.058 kg)  08/22/13 130 lb 3.2 oz (59.058 kg)  07/28/13 130 lb (58.968 kg)  04/06/11 162 lb 11.2 oz (73.8 kg)  04/06/11 162 lb 11.2 oz (73.8 kg)    Usual Body  Weight: 155#  % Usual Body Weight: 84%  BMI:  Body mass index is 19.8 kg/(m^2).normal range  Estimated Nutritional Needs: Kcal: 1610-9604 Protein: 70-83 gr Fluid: 1800 ml daily  Skin: intact  Diet Order: NPO  EDUCATION NEEDS: -Education not appropriate at this time   Intake/Output Summary (Last 24 hours) at 08/23/13 1017 Last data filed at 08/23/13 0916  Gross per 24 hour  Intake 1101.67 ml  Output      0 ml  Net 1101.67 ml    Last BM: 08/22/13 diarrhea  Labs:   Recent Labs Lab 08/22/13 1727 08/23/13 0544  NA 130* 133*  K 3.9 3.9  CL 92* 97  CO2 31 30  BUN 18 12  CREATININE 1.00 0.89  CALCIUM 8.6 8.3*  GLUCOSE 108* 103*    CBG (last 3)  No results found for this basename: GLUCAP,  in the last 72 hours  Scheduled Meds: . albuterol  2.5 mg Nebulization QID  . enoxaparin (LOVENOX) injection  40 mg Subcutaneous Q24H  . nicotine  14 mg Transdermal Daily  . pantoprazole (PROTONIX) IV  40 mg Intravenous Q24H    Continuous Infusions: . sodium chloride 100 mL/hr at 08/22/13 2345    Past Medical History  Diagnosis Date  . Hypertension   . Reflux     Past Surgical History  Procedure Laterality Date  . Hernia repair    . Laparotomy  04/08/2011    Procedure: EXPLORATORY LAPAROTOMY;  Surgeon: Dalia Heading;  Location: [facility];  Service: General;  Laterality: N/A;  Exploratory Laparotomy with Partial Small Bowel Resection  . Small intestiine surgery    . External ear surgery      Royann Shivers MS,RD,CSG,LDN Office: [phone] Pager: [phone]

## 2013-08-23 NOTE — Progress Notes (Signed)
Patient desaturated to 60 scope removed, anesthesia personal in to assist

## 2013-08-23 NOTE — Progress Notes (Signed)
[facility] PROGRESS NOTE  Randy Le:[id number] DOB: 07-05-1956 DOA: 08/22/2013 PCP: Kirk Ruths, MD  Assessment/Plan: Abdominal Pain/Weight Loss -He had an EGD today with findings of gastric outlet obstruction suspicious for linitis plastica or gastric lymphoma. Biopsies taken and pending. -Had over 2 liters of stomach contents removed. -There was concern for aspiration during procedure and some respiratory distress for which he was transferred to the ICU and started on Zosyn. -He required NRB, but is now back on [place] oxygen. -Has NG that remains to suction. -PICC has been placed to start TPN today.  Bilateral Lower Extremity Edema -Likely related to malnutrition and hypoalbuminemia. -TNA to start today.  Hyponatremia -Related to dehydration. -Improving with IVF.  Code Status: Full Code Family Communication: Patient only  Disposition Plan: To be determined   Consultants:  GI, Dr. Karilyn Cota   Antibiotics:  Zosyn 08/23/2013-->   Subjective: Feels improved.  Objective: Filed Vitals:   08/23/13 1515 08/23/13 1530 08/23/13 1545 08/23/13 1625  BP: 114/68 125/66 127/81   Pulse: 95  107   Temp:      TempSrc:      Resp: 25 26 28    Height:      Weight:      SpO2: 100%  100% 100%    Intake/Output Summary (Last 24 hours) at 08/23/13 1655 Last data filed at 08/23/13 1545  Gross per 24 hour  Intake 1431.67 ml  Output   2500 ml  Net -1068.33 ml   Filed Weights   08/22/13 1600 08/22/13 2307  Weight: 55.792 kg (123 lb) 59.058 kg (130 lb 3.2 oz)    Exam:   General:  AA Ox3  Cardiovascular: RRR  Respiratory: CTA B  Abdomen: S/NT/ND/+BS  Extremities: 2-3++ pitting edema bilaterally.   Neurologic:  Grossly intact and non-focal.  Data Reviewed: Basic Metabolic Panel:  Recent Labs Lab 08/22/13 1727 08/23/13 0544  NA 130* 133*  K 3.9 3.9  CL 92* 97  CO2 31 30  GLUCOSE 108* 103*  BUN 18 12  CREATININE 1.00 0.89  CALCIUM 8.6 8.3*    Liver Function Tests:  Recent Labs Lab 08/22/13 1727 08/23/13 0544  AST 12 14  ALT 10 11  ALKPHOS 48 47  BILITOT 0.3 0.3  PROT 5.5* 5.1*  ALBUMIN 2.4* 2.3*    Recent Labs Lab 08/22/13 1727  LIPASE 61*   No results found for this basename: AMMONIA,  in the last 168 hours CBC:  Recent Labs Lab 08/22/13 1727 08/23/13 0544  WBC 8.1 9.2  NEUTROABS 6.1  --   HGB 12.5* 13.6  HCT 35.6* 39.2  MCV 95.2 95.4  PLT 358 387   Cardiac Enzymes: No results found for this basename: CKTOTAL, CKMB, CKMBINDEX, TROPONINI,  in the last 168 hours BNP (last 3 results)  Recent Labs  08/22/13 1727  PROBNP 113.8   CBG: No results found for this basename: GLUCAP,  in the last 168 hours  Recent Results (from the past 240 hour(s))  MRSA PCR SCREENING     Status: None   Collection Time    08/23/13  1:30 PM      Result Value Range Status   MRSA by PCR NEGATIVE  NEGATIVE Final   Comment:            The GeneXpert MRSA Assay (FDA     approved for NASAL specimens     only), is one component of a     comprehensive MRSA colonization     surveillance program.  It is not     intended to diagnose MRSA     infection nor to guide or     monitor treatment for     MRSA infections.     Studies: Dg Chest Port 1 View  08/23/2013   CLINICAL DATA:  PICC placement.  EXAM: PORTABLE CHEST - 1 VIEW  COMPARISON:  Yesterday.  FINDINGS: Interval right PICC with its tip at the cavoatrial junction. Normal sized heart. Clear lungs. Bilateral skin folds. Minimal scoliosis.  IMPRESSION: 1. Right PICC tip at the cavoatrial junction. 2. No acute abnormality.   Electronically Signed   By: Gordan Payment M.D.   On: 08/23/2013 16:06   Dg Abd Acute W/chest  08/22/2013   CLINICAL DATA:  Abdominal pain and lower extremity swelling. History of small bowel resection. Smoker and hypertension.  EXAM: ACUTE ABDOMEN SERIES (ABDOMEN 2 VIEW & CHEST 1 VIEW)  COMPARISON:  CT 08/13/2013.  Plain film chest 08/02/2013.  FINDINGS:  Frontal view of the chest demonstrates midline trachea. Normal heart size. Aortic atherosclerosis. No pleural effusion or pneumothorax. Hyperinflation/COPD. Clear lungs.  Abdominal films demonstrate no free intraperitoneal air or significant air-fluid levels on upright positioning. Paucity of small bowel gas on supine imaging. No significant distal gas identified. No abnormal abdominal calcifications. The right-sided abdominal calcification is likely within the enteric stream, when correlated with recent CT.  IMPRESSION: Nonspecific paucity of abdominal pelvic bowel gas. This could relate to ascites, given the appearance of the abdomen on prior CT. Fluid-filled bowel loops cannot be excluded.  No free intraperitoneal air.  Hyperinflation/COPD, without acute cardiopulmonary disease.   Electronically Signed   By: Jeronimo Greaves M.D.   On: 08/22/2013 17:21    Scheduled Meds: . albuterol  2.5 mg Nebulization QID  . atropine      . enoxaparin (LOVENOX) injection  40 mg Subcutaneous Q24H  . [START ON 08/24/2013] insulin aspart  0-9 Units Subcutaneous Q6H  . meperidine      . midazolam      . nicotine  14 mg Transdermal Daily  . pantoprazole (PROTONIX) IV  40 mg Intravenous Q12H  . piperacillin-tazobactam  3.375 g Intravenous Q8H  . sodium chloride  10-40 mL Intracatheter Q12H   Continuous Infusions: . sodium chloride 100 mL/hr at 08/23/13 1640  . sodium chloride    . Marland KitchenTPN (CLINIMIX-E) Adult     And  . fat emulsion      Principal Problem:   Weight loss, unintentional Active Problems:   GERD (gastroesophageal reflux disease)   Gastric wall thickening   Hyponatremia   Dehydration   Tobacco abuse   COPD (chronic obstructive pulmonary disease)   Pedal edema   Protein-calorie malnutrition, severe    Time spent: 35 minutes.  Greater than 50% of this time was spent in direct contact with the patient coordinating care.    Chaya Jan  [facility] Pager [phone]  If  7PM-7AM, please contact night-coverage at [email or web address], password [facility] 08/23/2013, 4:55 PM  LOS: 1 day

## 2013-08-23 NOTE — Progress Notes (Signed)
RECEIVED PT FROM ENDOSCOPY. O2 AT 100% VIA NRB. PT VERY DROWSY BUT RESPONDS TO VIGUROUS STIMULI. NG TUBE INPLACE TO LIS DRAINING LIGHT BROWN MUDDY LOOKING FLUID. RT FOREARM IV PATENT W/ NS AT 100CC/HR.HR 9O'S IN NSR.WIFE AT BEDSIDE.

## 2013-08-24 DIAGNOSIS — K219 Gastro-esophageal reflux disease without esophagitis: Secondary | ICD-10-CM

## 2013-08-24 DIAGNOSIS — Z8711 Personal history of peptic ulcer disease: Secondary | ICD-10-CM

## 2013-08-24 DIAGNOSIS — K56609 Unspecified intestinal obstruction, unspecified as to partial versus complete obstruction: Secondary | ICD-10-CM

## 2013-08-24 LAB — DIFFERENTIAL
Basophils Relative: 0 % (ref 0–1)
Eosinophils Absolute: 0.1 10*3/uL (ref 0.0–0.7)
Lymphocytes Relative: 8 % — ABNORMAL LOW (ref 12–46)
Monocytes Absolute: 0.4 10*3/uL (ref 0.1–1.0)
Monocytes Relative: 5 % (ref 3–12)
Neutro Abs: 5.7 10*3/uL (ref 1.7–7.7)
Neutrophils Relative %: 86 % — ABNORMAL HIGH (ref 43–77)

## 2013-08-24 LAB — GLUCOSE, CAPILLARY
Glucose-Capillary: 144 mg/dL — ABNORMAL HIGH (ref 70–99)
Glucose-Capillary: 153 mg/dL — ABNORMAL HIGH (ref 70–99)
Glucose-Capillary: 158 mg/dL — ABNORMAL HIGH (ref 70–99)

## 2013-08-24 LAB — COMPREHENSIVE METABOLIC PANEL
ALT: 10 U/L (ref 0–53)
AST: 14 U/L (ref 0–37)
Albumin: 1.7 g/dL — ABNORMAL LOW (ref 3.5–5.2)
Alkaline Phosphatase: 39 U/L (ref 39–117)
BUN: 10 mg/dL (ref 6–23)
Calcium: 7.7 mg/dL — ABNORMAL LOW (ref 8.4–10.5)
GFR calc Af Amer: >90 mL/min (ref >90)
Glucose, Bld: 171 mg/dL — ABNORMAL HIGH (ref 70–99)
Potassium: 3.3 mEq/L — ABNORMAL LOW (ref 3.5–5.1)
Sodium: 130 mEq/L — ABNORMAL LOW (ref 135–145)
Total Protein: 4.2 g/dL — ABNORMAL LOW (ref 6.0–8.3)

## 2013-08-24 LAB — TRIGLYCERIDES: Triglycerides: 98 mg/dL (ref <150)

## 2013-08-24 LAB — PHOSPHORUS: Phosphorus: 2.1 mg/dL — ABNORMAL LOW (ref 2.3–4.6)

## 2013-08-24 LAB — CBC
HCT: 31.3 % — ABNORMAL LOW (ref 39.0–52.0)
Hemoglobin: 10.9 g/dL — ABNORMAL LOW (ref 13.0–17.0)
MCHC: 34.8 g/dL (ref 30.0–36.0)
RBC: 3.28 MIL/uL — ABNORMAL LOW (ref 4.22–5.81)
WBC: 6.7 10*3/uL (ref 4.0–10.5)

## 2013-08-24 MED ORDER — MORPHINE SULFATE 2 MG/ML IJ SOLN
1.0000 mg | INTRAMUSCULAR | Status: DC | PRN
  Administered 2013-08-24 – 2013-08-25 (×6): 1 mg via INTRAVENOUS
  Filled 2013-08-24 (×6): qty 1

## 2013-08-24 MED ORDER — POTASSIUM CHLORIDE 10 MEQ/100ML IV SOLN
10.0000 meq | INTRAVENOUS | Status: AC
  Administered 2013-08-24 (×4): 10 meq via INTRAVENOUS
  Filled 2013-08-24 (×2): qty 100

## 2013-08-24 MED ORDER — TRACE MINERALS CR-CU-F-FE-I-MN-MO-SE-ZN IV SOLN
INTRAVENOUS | Status: AC
  Administered 2013-08-24: 18:00:00 via INTRAVENOUS
  Filled 2013-08-24: qty 2000

## 2013-08-24 MED ORDER — FAT EMULSION 20 % IV EMUL
250.0000 mL | INTRAVENOUS | Status: AC
  Administered 2013-08-24: 250 mL via INTRAVENOUS
  Filled 2013-08-24: qty 250

## 2013-08-24 NOTE — Progress Notes (Signed)
PARENTERAL NUTRITION CONSULT NOTE  Pharmacy Consult for TPN Indication: Intolerance to enteral feeding, ulcerative esophagitis(secondary to gastric outlet obstruction). Hypoalbuminemia.  No Known Allergies  Patient Measurements: Height: 5\' 8"  (172.7 cm) Weight: 130 lb 3.2 oz (59.058 kg) IBW/kg (Calculated) : 68.4  Vital Signs: Temp: 98.6 F (37 C) (12/06 0000) Temp src: Oral (12/06 0000) BP: 112/65 mmHg (12/06 0830) Pulse Rate: 92 (12/06 0830) Intake/Output from previous day: 12/05 0701 - 12/06 0700 In: 2810 [I.V.:1894.2; IV Piggyback:100; TPN:815.8] Out: 2850 [Urine:350; Emesis/NG output:2500] Intake/Output from this shift: Total I/O In: 110 [I.V.:50; TPN:60] Out: -   Labs:  Recent Labs  08/22/13 1727 08/23/13 0544 08/24/13 0455  WBC 8.1 9.2 6.7  HGB 12.5* 13.6 10.9*  HCT 35.6* 39.2 31.3*  PLT 358 387 279  INR  --  1.10  --      Recent Labs  08/22/13 1727 08/23/13 0544 08/24/13 0455  NA 130* 133* 130*  K 3.9 3.9 3.3*  CL 92* 97 98  CO2 31 30 28   GLUCOSE 108* 103* 171*  BUN 18 12 10   CREATININE 1.00 0.89 0.77  CALCIUM 8.6 8.3* 7.7*  MG  --   --  2.1  PHOS  --   --  2.1*  PROT 5.5* 5.1* 4.2*  ALBUMIN 2.4* 2.3* 1.7*  AST 12 14 14   ALT 10 11 10   ALKPHOS 48 47 39  BILITOT 0.3 0.3 0.3  TRIG  --   --  98   Estimated Creatinine Clearance: 84.1 ml/min (by C-G formula based on Cr of 0.77).   Recent Labs  08/23/13 1830 08/24/13 0008 08/24/13 0556  GLUCAP 94 144* 172*    Medical History: Past Medical History  Diagnosis Date  . Hypertension   . Reflux    Medications:  Scheduled:  . albuterol  2.5 mg Nebulization QID  . antiseptic oral rinse  15 mL Mouth Rinse q12n4p  . chlorhexidine  15 mL Mouth Rinse BID  . insulin aspart  0-9 Units Subcutaneous Q6H  . nicotine  14 mg Transdermal Daily  . pantoprazole (PROTONIX) IV  40 mg Intravenous Q12H  . piperacillin-tazobactam  3.375 g Intravenous Q8H  . potassium chloride  10 mEq Intravenous Q1 Hr  x 4  . sodium chloride  10-40 mL Intracatheter Q12H   Insulin Requirements in the past 24 hours:  3 units Current Nutrition: Titrating TPN  Assessment: Okay for Protocol, chemistry panel reviewed.  Mild hypokalemia being replenished.  Calcium corrects to 9.54.  Albumin lower today but likely a dilutional component from IV fluids.  I/O - 150 EGD performed today showed ulcerative esophagitis(secondary to gastric outlet obstruction).  Dietician w/u pending.  Nutritional Goals:  2000 kCal, 90-100 grams of protein per day  Plan:  Increase Clinimix E 5/15 to 60 ml/hr today Lipids @ 10 ml/hr (daily with Clinimix) Lipids, MVI, trace elements daily. Labs per protocol.  Margo Aye, Siniya Lichty A 08/24/2013,10:30 AM

## 2013-08-24 NOTE — Progress Notes (Signed)
PT TRANSFERRED TO ROOM 317. NG TO LIS DRAINING BROWN MUDDY FLUID. O2 AT 3L/MIN VIA [place]. O2 SAT 97% COUGH PRODUCTIVE OF THIN TAN FLUID.Marland KitchenTRAN RT TRIPLE LUMEN PICC LINE PATENT. NS,TPN AND LIPIDS INFUSING. DENIES ANY FURTHER ABDOMINAL PAIN.TRANSFER REPORT CALLED TO MARYANN ON 300.

## 2013-08-24 NOTE — Progress Notes (Signed)
Case discussed with Dr. Karilyn Cota last evening.  Patient complains of sore throat from NG tube; otherwise, states he's feeling better since having his stomach decompressed yesterday. One small bowel movement yesterday.     Vital signs in last 24 hours: Temp:  [98 F (36.7 C)-99.2 F (37.3 C)] 98.6 F (37 C) (12/06 0000) Pulse Rate:  [80-133] 92 (12/06 0830) Resp:  [16-34] 19 (12/06 0830) BP: (63-157)/(44-139) 112/65 mmHg (12/06 0830) SpO2:  [60 %-100 %] 94 % (12/06 0830) Last BM Date: 08/22/13    General:   Alert,  conversant, somewhat disheveled. No acute distress. He has an NG tube in place and nasal O2. NAD                    500 cc out of the NG tube overnight since being admitted to the ICU -nonbloody.  Chest:   Lungs are fairly clear bilaterally this morning. Abdomen:  Nondistended.  Normal bowel sounds,  Minimal tenderness to palpation. Extremities:  Without clubbing or edema.    Intake/Output from previous day: 12/05 0701 - 12/06 0700 In: 2810 [I.V.:1894.2; IV Piggyback:100; TPN:815.8] Out: 2850 [Urine:350; Emesis/NG output:2500] Intake/Output this shift: Total I/O In: 110 [I.V.:50; TPN:60] Out: -   Lab Results:  Recent Labs  08/22/13 1727 08/23/13 0544 08/24/13 0455  WBC 8.1 9.2 6.7  HGB 12.5* 13.6 10.9*  HCT 35.6* 39.2 31.3*  PLT 358 387 279   BMET  Recent Labs  08/22/13 1727 08/23/13 0544 08/24/13 0455  NA 130* 133* 130*  K 3.9 3.9 3.3*  CL 92* 97 98  CO2 31 30 28   GLUCOSE 108* 103* 171*  BUN 18 12 10   CREATININE 1.00 0.89 0.77  CALCIUM 8.6 8.3* 7.7*   LFT  Recent Labs  08/24/13 0455  PROT 4.2*  ALBUMIN 1.7*  AST 14  ALT 10  ALKPHOS 39  BILITOT 0.3    Impression:   Abnormal stomach producing a high-grade partial gastric outlet obstruction as described Dr. Karilyn Cota.  Gastric mucosa was biopsied. Patient has remained stable overnight and overall is feeling better since having the stomach decompressed. Apparently, he may have had a  little aspiration around the time of the procedure but I see no major complications related to his recent intervention yesterday.   Recommendations:   Continue decompression with NG tube at low intermittent wall suction. Continue Zosyn for now. Review biopsies as they become available the first of the week.  Chloraseptic for sore throat. Hyponatremia management per attending.

## 2013-08-24 NOTE — Progress Notes (Signed)
NUTRITION FOLLOW UP  Intervention:   TPN per pharmacy  Nutrition Dx:   Inadequate oral intake related to inability to eat, altered GI funtion as evidenced by NPO status, NG tube placed for gastric decompression; ongoing  Goal:   Pt to meet >/= 90% of their estimated nutrition needs; progressing  Monitor:   Nutrition Support measures, labs and weights   Assessment:   Pt s/p EGD yesterday. Unfortunately, pt experienced respiratory distress during procedure and was placed in ICU for closer monitoring. Pt with gastric outlet obstruction and was placed on TPN yesterday evening. Biopsies from EGD are pending.  Pt was originally started on Clinimix E 5/15 @ 50 ml/hr, but has since titrated to 60 ml/hr. At current rate, this provides 1022 kcals (734 kcals from dextrose and 288 from amino acids). Pt is also receiving 250 ml 20% lipid emulsion. Complete regimen provides 1522 kcals, which meet 86% of estimated calorie needs and 100% of protein needs.   Height: Ht Readings from Last 1 Encounters:  08/22/13 5\' 8"  (1.727 m)    Weight Status:   Wt Readings from Last 1 Encounters:  08/22/13 130 lb 3.2 oz (59.058 kg)    Re-estimated needs:  Kcal: 4098-1191  Protein: 70-83 gr  Fluid: 1800 ml daily  Skin: Intact  Diet Order: NPO   Intake/Output Summary (Last 24 hours) at 08/24/13 1409 Last data filed at 08/24/13 1110  Gross per 24 hour  Intake 2198.33 ml  Output    750 ml  Net 1448.33 ml    Last BM: 08/22/13   Labs:   Recent Labs Lab 08/22/13 1727 08/23/13 0544 08/24/13 0455  NA 130* 133* 130*  K 3.9 3.9 3.3*  CL 92* 97 98  CO2 31 30 28   BUN 18 12 10   CREATININE 1.00 0.89 0.77  CALCIUM 8.6 8.3* 7.7*  MG  --   --  2.1  PHOS  --   --  2.1*  GLUCOSE 108* 103* 171*    CBG (last 3)   Recent Labs  08/24/13 0008 08/24/13 0556 08/24/13 1128  GLUCAP 144* 172* 158*    Scheduled Meds: . albuterol  2.5 mg Nebulization QID  . antiseptic oral rinse  15 mL Mouth Rinse  q12n4p  . chlorhexidine  15 mL Mouth Rinse BID  . insulin aspart  0-9 Units Subcutaneous Q6H  . nicotine  14 mg Transdermal Daily  . pantoprazole (PROTONIX) IV  40 mg Intravenous Q12H  . piperacillin-tazobactam  3.375 g Intravenous Q8H  . potassium chloride  10 mEq Intravenous Q1 Hr x 4  . sodium chloride  10-40 mL Intracatheter Q12H    Continuous Infusions: . sodium chloride 50 mL/hr at 08/24/13 1110  . Marland KitchenTPN (CLINIMIX-E) Adult 50 mL/hr at 08/24/13 1110   And  . fat emulsion 10 kcal (08/24/13 1110)  . Marland KitchenTPN (CLINIMIX-E) Adult     And  . fat emulsion      Randy Le Knife, RD, LDN Pager: [phone]

## 2013-08-24 NOTE — Progress Notes (Signed)
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 Biopsies taken and pending. -Had over 2 liters of stomach contents removed. -There was concern for aspiration during procedure and some respiratory distress for which he was transferred to the ICU and started on Zosyn. -Is now maintaining good oxygen sats on [place] oxygen. -Has NG that remains to suction. -Continue TPN.  Bilateral Lower Extremity Edema -Likely related to malnutrition and hypoalbuminemia. -TNA to start today.  Hyponatremia -Related to dehydration. -Follow.  Code Status: Full Code Family Communication: Wife Randy Le at bedside updated on plan of care. Disposition Plan: To be determined   Consultants:  GI, Dr. Karilyn Cota   Antibiotics:  Zosyn 08/23/2013-->   Subjective: Feels improved. Wants to eat.  Objective: Filed Vitals:   08/24/13 1000 08/24/13 1100 08/24/13 1130 08/24/13 1318  BP: 110/61 87/46    Pulse: 106 93    Temp:   98.7 F (37.1 C)   TempSrc:   Oral   Resp: 21 21    Height:      Weight:      SpO2: 97% 100%  95%    Intake/Output Summary (Last 24 hours) at 08/24/13 1329 Last data filed at 08/24/13 1110  Gross per 24 hour  Intake 2298.33 ml  Output    750 ml  Net 1548.33 ml   Filed Weights   08/22/13 1600 08/22/13 2307  Weight: 55.792 kg (123 lb) 59.058 kg (130 lb 3.2 oz)    Exam:   General:  AA Ox3  Cardiovascular: RRR  Respiratory: CTA B  Abdomen: S/NT/ND/+BS  Extremities: 1-2++ pitting edema bilaterally.   Neurologic:  Grossly intact and non-focal.  Data Reviewed: Basic Metabolic Panel:  Recent Labs Lab 08/22/13 1727 08/23/13 0544 08/24/13 0455  NA 130* 133* 130*  K 3.9 3.9 3.3*  CL 92* 97 98  CO2 31 30 28   GLUCOSE 108* 103* 171*  BUN 18  12 10   CREATININE 1.00 0.89 0.77  CALCIUM 8.6 8.3* 7.7*  MG  --   --  2.1  PHOS  --   --  2.1*   Liver Function Tests:  Recent Labs Lab 08/22/13 1727 08/23/13 0544 08/24/13 0455  AST 12 14 14   ALT 10 11 10   ALKPHOS 48 47 39  BILITOT 0.3 0.3 0.3  PROT 5.5* 5.1* 4.2*  ALBUMIN 2.4* 2.3* 1.7*    Recent Labs Lab 08/22/13 1727  LIPASE 61*   No results found for this basename: AMMONIA,  in the last 168 hours CBC:  Recent Labs Lab 08/22/13 1727 08/23/13 0544 08/24/13 0455  WBC 8.1 9.2 6.7  NEUTROABS 6.1  --  5.7  HGB 12.5* 13.6 10.9*  HCT 35.6* 39.2 31.3*  MCV 95.2 95.4 95.4  PLT 358 387 279   Cardiac Enzymes: No results found for this basename: CKTOTAL, CKMB, CKMBINDEX, TROPONINI,  in the last 168 hours BNP (last 3 results)  Recent Labs  08/22/13 1727  PROBNP 113.8   CBG:  Recent Labs Lab 08/23/13 1830 08/24/13 0008 08/24/13 0556 08/24/13 1128  GLUCAP 94 144* 172* 158*    Recent Results (from the past 240 hour(s))  MRSA PCR SCREENING     Status: None   Collection Time    08/23/13  1:30 PM      Result Value Range Status   MRSA by  PCR NEGATIVE  NEGATIVE Final   Comment:            The GeneXpert MRSA Assay (FDA     approved for NASAL specimens     only), is one component of a     comprehensive MRSA colonization     surveillance program. It is not     intended to diagnose MRSA     infection nor to guide or     monitor treatment for     MRSA infections.     Studies: Dg Chest Port 1 View  08/23/2013   CLINICAL DATA:  PICC placement.  EXAM: PORTABLE CHEST - 1 VIEW  COMPARISON:  Yesterday.  FINDINGS: Interval right PICC with its tip at the cavoatrial junction. Normal sized heart. Clear lungs. Bilateral skin folds. Minimal scoliosis.  IMPRESSION: 1. Right PICC tip at the cavoatrial junction. 2. No acute abnormality.   Electronically Signed   By: Gordan Payment M.D.   On: 08/23/2013 16:06   Dg Abd Acute W/chest  08/22/2013   CLINICAL DATA:  Abdominal  pain and lower extremity swelling. History of small bowel resection. Smoker and hypertension.  EXAM: ACUTE ABDOMEN SERIES (ABDOMEN 2 VIEW & CHEST 1 VIEW)  COMPARISON:  CT 08/13/2013.  Plain film chest 08/02/2013.  FINDINGS: Frontal view of the chest demonstrates midline trachea. Normal heart size. Aortic atherosclerosis. No pleural effusion or pneumothorax. Hyperinflation/COPD. Clear lungs.  Abdominal films demonstrate no free intraperitoneal air or significant air-fluid levels on upright positioning. Paucity of small bowel gas on supine imaging. No significant distal gas identified. No abnormal abdominal calcifications. The right-sided abdominal calcification is likely within the enteric stream, when correlated with recent CT.  IMPRESSION: Nonspecific paucity of abdominal pelvic bowel gas. This could relate to ascites, given the appearance of the abdomen on prior CT. Fluid-filled bowel loops cannot be excluded.  No free intraperitoneal air.  Hyperinflation/COPD, without acute cardiopulmonary disease.   Electronically Signed   By: Jeronimo Greaves M.D.   On: 08/22/2013 17:21    Scheduled Meds: . albuterol  2.5 mg Nebulization QID  . antiseptic oral rinse  15 mL Mouth Rinse q12n4p  . chlorhexidine  15 mL Mouth Rinse BID  . insulin aspart  0-9 Units Subcutaneous Q6H  . nicotine  14 mg Transdermal Daily  . pantoprazole (PROTONIX) IV  40 mg Intravenous Q12H  . piperacillin-tazobactam  3.375 g Intravenous Q8H  . potassium chloride  10 mEq Intravenous Q1 Hr x 4  . sodium chloride  10-40 mL Intracatheter Q12H   Continuous Infusions: . sodium chloride    . sodium chloride 50 mL/hr at 08/24/13 1110  . Marland KitchenTPN (CLINIMIX-E) Adult 50 mL/hr at 08/24/13 1110   And  . fat emulsion 10 kcal (08/24/13 1110)  . Marland KitchenTPN (CLINIMIX-E) Adult     And  . fat emulsion      Principal Problem:   Weight loss, unintentional Active Problems:   GERD (gastroesophageal reflux disease)   Gastric wall thickening   Hyponatremia    Dehydration   Tobacco abuse   COPD (chronic obstructive pulmonary disease)   Pedal edema   Protein-calorie malnutrition, severe    Time spent: 35 minutes.  Greater than 50% of this time was spent in direct contact with the patient coordinating care.    Chaya Jan  [facility] Pager [phone]  If 7PM-7AM, please contact night-coverage at [email or web address], password [facility] 08/24/2013, 1:29 PM  LOS: 2 days

## 2013-08-25 LAB — CBC
HCT: 31.2 % — ABNORMAL LOW (ref 39.0–52.0)
Hemoglobin: 10.8 g/dL — ABNORMAL LOW (ref 13.0–17.0)
RBC: 3.26 MIL/uL — ABNORMAL LOW (ref 4.22–5.81)

## 2013-08-25 LAB — GLUCOSE, CAPILLARY
Glucose-Capillary: 125 mg/dL — ABNORMAL HIGH (ref 70–99)
Glucose-Capillary: 135 mg/dL — ABNORMAL HIGH (ref 70–99)

## 2013-08-25 LAB — BASIC METABOLIC PANEL
Chloride: 103 mEq/L (ref 96–112)
Creatinine, Ser: 0.68 mg/dL (ref 0.50–1.35)
GFR calc Af Amer: >90 mL/min (ref >90)
GFR calc non Af Amer: >90 mL/min (ref >90)
Glucose, Bld: 146 mg/dL — ABNORMAL HIGH (ref 70–99)
Potassium: 3.4 mEq/L — ABNORMAL LOW (ref 3.5–5.1)
Sodium: 137 mEq/L (ref 135–145)

## 2013-08-25 MED ORDER — POTASSIUM CHLORIDE 10 MEQ/100ML IV SOLN
10.0000 meq | INTRAVENOUS | Status: AC
  Administered 2013-08-25 (×4): 10 meq via INTRAVENOUS
  Filled 2013-08-25 (×4): qty 100

## 2013-08-25 MED ORDER — FAT EMULSION 20 % IV EMUL
250.0000 mL | INTRAVENOUS | Status: AC
  Administered 2013-08-25: 250 mL via INTRAVENOUS
  Filled 2013-08-25: qty 250

## 2013-08-25 MED ORDER — MORPHINE SULFATE 2 MG/ML IJ SOLN
1.0000 mg | INTRAMUSCULAR | Status: DC | PRN
  Administered 2013-08-25 – 2013-08-27 (×16): 1 mg via INTRAVENOUS
  Filled 2013-08-25 (×15): qty 1

## 2013-08-25 MED ORDER — TRACE MINERALS CR-CU-F-FE-I-MN-MO-SE-ZN IV SOLN
INTRAVENOUS | Status: AC
  Administered 2013-08-25: 17:00:00 via INTRAVENOUS
  Filled 2013-08-25: qty 2000

## 2013-08-25 MED ORDER — MORPHINE SULFATE 2 MG/ML IJ SOLN
INTRAMUSCULAR | Status: AC
  Filled 2013-08-25: qty 1

## 2013-08-25 NOTE — Progress Notes (Signed)
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 Biopsies taken and pending. -Had over 2 liters of stomach contents removed. -There was concern for aspiration during procedure and some respiratory distress for which he was transferred to the ICU and started on Zosyn. -Is now maintaining good oxygen sats on [place] oxygen. -Has NG that remains to suction. -Continue TPN. -C/o increased pain: will adjust frequency of pain medications.  Bilateral Lower Extremity Edema -Likely related to malnutrition and hypoalbuminemia. -TNA to start today. -Almost resolved.  Hyponatremia -Related to dehydration. -Follow.  Code Status: Full Code Family Communication: Wife [name] at bedside updated on plan of care. Disposition Plan: To be determined   Consultants:  GI, Dr. Karilyn Cota   Antibiotics:  Zosyn 08/23/2013-->   Subjective: Has had increase in abdominal pain today.  Objective: Filed Vitals:   08/25/13 0514 08/25/13 0833 08/25/13 1153 08/25/13 1441  BP: 116/70   107/69  Pulse: 105   101  Temp: 98.4 F (36.9 C)   99 F (37.2 C)  TempSrc: Oral   Oral  Resp: 24   22  Height:      Weight:      SpO2: 97% 96% 96% 99%    Intake/Output Summary (Last 24 hours) at 08/25/13 1459 Last data filed at 08/25/13 1200  Gross per 24 hour  Intake 2186.83 ml  Output   2300 ml  Net -113.17 ml   Filed Weights   08/22/13 1600 08/22/13 2307  Weight: 55.792 kg (123 lb) 59.058 kg (130 lb 3.2 oz)    Exam:   General:  AA Ox3  Cardiovascular: RRR  Respiratory: CTA B  Abdomen: S/NT/ND/+BS  Extremities: trace-1+pitting edema bilaterally.  Neurologic:  Grossly intact and non-focal.  Data Reviewed: Basic Metabolic Panel:  Recent Labs Lab 08/22/13 1727  08/23/13 0544 08/24/13 0455 08/25/13 0617  NA 130* 133* 130* 137  K 3.9 3.9 3.3* 3.4*  CL 92* 97 98 103  CO2 31 30 28 26   GLUCOSE 108* 103* 171* 146*  BUN 18 12 10 7   CREATININE 1.00 0.89 0.77 0.68  CALCIUM 8.6 8.3* 7.7* 7.8*  MG  --   --  2.1  --   PHOS  --   --  2.1*  --    Liver Function Tests:  Recent Labs Lab 08/22/13 1727 08/23/13 0544 08/24/13 0455  AST 12 14 14   ALT 10 11 10   ALKPHOS 48 47 39  BILITOT 0.3 0.3 0.3  PROT 5.5* 5.1* 4.2*  ALBUMIN 2.4* 2.3* 1.7*    Recent Labs Lab 08/22/13 1727  LIPASE 61*   No results found for this basename: AMMONIA,  in the last 168 hours CBC:  Recent Labs Lab 08/22/13 1727 08/23/13 0544 08/24/13 0455 08/25/13 0617  WBC 8.1 9.2 6.7 7.6  NEUTROABS 6.1  --  5.7  --   HGB 12.5* 13.6 10.9* 10.8*  HCT 35.6* 39.2 31.3* 31.2*  MCV 95.2 95.4 95.4 95.7  PLT 358 387 279 256   Cardiac Enzymes: No results found for this basename: CKTOTAL, CKMB, CKMBINDEX, TROPONINI,  in the last 168 hours BNP (last 3 results)  Recent Labs  08/22/13 1727  PROBNP 113.8   CBG:  Recent Labs Lab 08/24/13 1128 08/24/13 1704 08/24/13 2313 08/25/13 0513 08/25/13 1133  GLUCAP 158* 143* 153* 130* 135*  Recent Results (from the past 240 hour(s))  MRSA PCR SCREENING     Status: None   Collection Time    08/23/13  1:30 PM      Result Value Range Status   MRSA by PCR NEGATIVE  NEGATIVE Final   Comment:            The GeneXpert MRSA Assay (FDA     approved for NASAL specimens     only), is one component of a     comprehensive MRSA colonization     surveillance program. It is not     intended to diagnose MRSA     infection nor to guide or     monitor treatment for     MRSA infections.     Studies: Dg Chest Port 1 View  08/23/2013   CLINICAL DATA:  PICC placement.  EXAM: PORTABLE CHEST - 1 VIEW  COMPARISON:  Yesterday.  FINDINGS: Interval right PICC with its tip at the cavoatrial junction. Normal sized heart. Clear lungs.  Bilateral skin folds. Minimal scoliosis.  IMPRESSION: 1. Right PICC tip at the cavoatrial junction. 2. No acute abnormality.   Electronically Signed   By: Gordan Payment M.D.   On: 08/23/2013 16:06    Scheduled Meds: . albuterol  2.5 mg Nebulization QID  . antiseptic oral rinse  15 mL Mouth Rinse q12n4p  . chlorhexidine  15 mL Mouth Rinse BID  . insulin aspart  0-9 Units Subcutaneous Q6H  . nicotine  14 mg Transdermal Daily  . pantoprazole (PROTONIX) IV  40 mg Intravenous Q12H  . piperacillin-tazobactam  3.375 g Intravenous Q8H  . sodium chloride  10-40 mL Intracatheter Q12H   Continuous Infusions: . sodium chloride 50 mL/hr at 08/24/13 1731  . Marland KitchenTPN (CLINIMIX-E) Adult 60 mL/hr at 08/24/13 1730   And  . fat emulsion 250 mL (08/24/13 1730)  . Marland KitchenTPN (CLINIMIX-E) Adult     And  . fat emulsion      Principal Problem:   Weight loss, unintentional Active Problems:   GERD (gastroesophageal reflux disease)   Gastric wall thickening   Hyponatremia   Dehydration   Tobacco abuse   COPD (chronic obstructive pulmonary disease)   Pedal edema   Protein-calorie malnutrition, severe    Time spent: 35 minutes.  Greater than 50% of this time was spent in direct contact with the patient coordinating care.    Chaya Jan  [facility] Pager [phone]  If 7PM-7AM, please contact night-coverage at [email or web address], password [facility] 08/25/2013, 2:59 PM  LOS: 3 days

## 2013-08-25 NOTE — Progress Notes (Signed)
PARENTERAL NUTRITION CONSULT NOTE  Pharmacy Consult for TPN Indication: Intolerance to enteral feeding, ulcerative esophagitis(secondary to gastric outlet obstruction). Hypoalbuminemia.  No Known Allergies  Patient Measurements: Height: 5\' 8"  (172.7 cm) Weight: 130 lb 3.2 oz (59.058 kg) IBW/kg (Calculated) : 68.4  Vital Signs: Temp: 98.4 F (36.9 C) (12/07 0514) Temp src: Oral (12/07 0514) BP: 116/70 mmHg (12/07 0514) Pulse Rate: 105 (12/07 0514) Intake/Output from previous day: 12/06 0701 - 12/07 0700 In: 3356.8 [I.V.:1185; NG/GT:120; IV Piggyback:550; TPN:1501.8] Out: 3350 [Urine:2350; Emesis/NG output:1000] Intake/Output from this shift:    Labs:  Recent Labs  08/23/13 0544 08/24/13 0455 08/25/13 0617  WBC 9.2 6.7 7.6  HGB 13.6 10.9* 10.8*  HCT 39.2 31.3* 31.2*  PLT 387 279 256  INR 1.10  --   --      Recent Labs  08/22/13 1727 08/23/13 0544 08/24/13 0455 08/25/13 0617  NA 130* 133* 130* 137  K 3.9 3.9 3.3* 3.4*  CL 92* 97 98 103  CO2 31 30 28 26   GLUCOSE 108* 103* 171* 146*  BUN 18 12 10 7   CREATININE 1.00 0.89 0.77 0.68  CALCIUM 8.6 8.3* 7.7* 7.8*  MG  --   --  2.1  --   PHOS  --   --  2.1*  --   PROT 5.5* 5.1* 4.2*  --   ALBUMIN 2.4* 2.3* 1.7*  --   AST 12 14 14   --   ALT 10 11 10   --   ALKPHOS 48 47 39  --   BILITOT 0.3 0.3 0.3  --   PREALBUMIN  --   --  6.7*  --   TRIG  --   --  98  --    Estimated Creatinine Clearance: 84.1 ml/min (by C-G formula based on Cr of 0.68).   Recent Labs  08/24/13 1704 08/24/13 2313 08/25/13 0513  GLUCAP 143* 153* 130*   Medical History: Past Medical History  Diagnosis Date  . Hypertension   . Reflux    Medications:  Scheduled:  . albuterol  2.5 mg Nebulization QID  . antiseptic oral rinse  15 mL Mouth Rinse q12n4p  . chlorhexidine  15 mL Mouth Rinse BID  . insulin aspart  0-9 Units Subcutaneous Q6H  . nicotine  14 mg Transdermal Daily  . pantoprazole (PROTONIX) IV  40 mg Intravenous Q12H  .  piperacillin-tazobactam  3.375 g Intravenous Q8H  . potassium chloride  10 mEq Intravenous Q1 Hr x 4  . sodium chloride  10-40 mL Intracatheter Q12H   Insulin Requirements in the past 24 hours:  6 units Current Nutrition: Titrating TPN  Assessment: Okay for Protocol, chemistry panel reviewed.  Mild hypokalemia being replenished.  Calcium corrects to WNL.  I/O + 116 Recent EGD showed ulcerative esophagitis (secondary to gastric outlet obstruction).  Sugars are borderline elevated but OK, plasma glucose improved today.  Pt on sliding scale insulin.  Dietician notes reviewed  Nutritional Goals:  2000 kCal, 90-100 grams of protein per day  Plan:  Increase Clinimix E 5/15 to 70 ml/hr today Lipids @ 10 ml/hr (daily with Clinimix) Lipids, MVI, trace elements daily. Labs per protocol.  Margo Aye, Olly Shiner A 08/25/2013,9:47 AM

## 2013-08-26 LAB — COMPREHENSIVE METABOLIC PANEL
BUN: 9 mg/dL (ref 6–23)
CO2: 29 mEq/L (ref 19–32)
Calcium: 8.2 mg/dL — ABNORMAL LOW (ref 8.4–10.5)
Chloride: 103 mEq/L (ref 96–112)
Creatinine, Ser: 0.67 mg/dL (ref 0.50–1.35)
GFR calc Af Amer: >90 mL/min (ref >90)
GFR calc non Af Amer: >90 mL/min (ref >90)
Glucose, Bld: 140 mg/dL — ABNORMAL HIGH (ref 70–99)
Total Bilirubin: 0.5 mg/dL (ref 0.3–1.2)

## 2013-08-26 LAB — CBC
MCHC: 34.8 g/dL (ref 30.0–36.0)
Platelets: 268 10*3/uL (ref 150–400)
RDW: 12.3 % (ref 11.5–15.5)
WBC: 5.6 10*3/uL (ref 4.0–10.5)

## 2013-08-26 LAB — PHOSPHORUS: Phosphorus: 3.2 mg/dL (ref 2.3–4.6)

## 2013-08-26 LAB — PREALBUMIN: Prealbumin: 6 mg/dL — ABNORMAL LOW (ref 17.0–34.0)

## 2013-08-26 LAB — DIFFERENTIAL
Basophils Absolute: 0 10*3/uL (ref 0.0–0.1)
Basophils Relative: 0 % (ref 0–1)
Lymphocytes Relative: 14 % (ref 12–46)
Monocytes Relative: 7 % (ref 3–12)
Neutro Abs: 4.2 10*3/uL (ref 1.7–7.7)
Neutrophils Relative %: 76 % (ref 43–77)

## 2013-08-26 LAB — GLUCOSE, CAPILLARY
Glucose-Capillary: 129 mg/dL — ABNORMAL HIGH (ref 70–99)
Glucose-Capillary: 109 mg/dL — ABNORMAL HIGH (ref 70–99)

## 2013-08-26 LAB — TRIGLYCERIDES: Triglycerides: 61 mg/dL (ref <150)

## 2013-08-26 LAB — MAGNESIUM: Magnesium: 1.9 mg/dL (ref 1.5–2.5)

## 2013-08-26 MED ORDER — TRACE MINERALS CR-CU-F-FE-I-MN-MO-SE-ZN IV SOLN
INTRAVENOUS | Status: AC
  Administered 2013-08-26: 18:00:00 via INTRAVENOUS
  Filled 2013-08-26: qty 2000

## 2013-08-26 MED ORDER — FAT EMULSION 20 % IV EMUL
250.0000 mL | INTRAVENOUS | Status: AC
  Administered 2013-08-26: 250 mL via INTRAVENOUS
  Filled 2013-08-26: qty 250

## 2013-08-26 MED ORDER — ALBUTEROL SULFATE (5 MG/ML) 0.5% IN NEBU
2.5000 mg | INHALATION_SOLUTION | Freq: Two times a day (BID) | RESPIRATORY_TRACT | Status: DC
  Administered 2013-08-26 – 2013-08-29 (×6): 2.5 mg via RESPIRATORY_TRACT
  Filled 2013-08-26 (×7): qty 0.5

## 2013-08-26 NOTE — Progress Notes (Signed)
Subjective; Patient denies nausea vomiting or heartburn. He now complains of upper abdominal or epigastric pain. He also complains of sore throat secondary to NG tube. He denies shortness of breath. He is coughing up clear and greenish sputum. Objective; BP 117/70  Pulse 100  Temp(Src) 98.1 F (36.7 C) (Oral)  Resp 17  Ht 5\' 8"  (1.727 m)  Wt 130 lb 3.2 oz (59.058 kg)  BMI 19.80 kg/m2  SpO2 96% Patient is alert and in no acute distress. Abdomen is flat and soft with a firm nodular mass in epigastric region which is mildly tender. Extremities are thin. NG output last 24 hours his 900 mL. Lab data; Serum albumin of 1.5. Bilirubin 0.5, AP 341, AST 143 and ALT 89 and albumin is 1.5. AST and ALT were 14 and 11 on 08/23/2013. Gastric biopsy and negative for lymphoma or carcinoma; deeper sections and special stains pending. Assessment. #1. 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 Status post EGD with biopsy on 08/23/2013.  Preliminary on gastric biopsies negative which means disease may be deeper. If deeper sections and special stains are negative he will need EUS with FNA. #2. Abnormal LFTs. Significant increase in transaminases and AP since admission. A be related to TPN but little to early to see this abnormality with TPN. If trend continues we will need another imaging study.

## 2013-08-26 NOTE — Care Management Note (Addendum)
    Page 1 of 1   08/27/2013     4:19:40 PM   CARE MANAGEMENT NOTE 08/27/2013  Patient:  Randy Le, Randy Le   Account Number:  [id number]  Date Initiated:  08/26/2013  Documentation initiated by:  Rosemary Holms  Subjective/Objective Assessment:   Pt from home where he lives with his wife. He plans on DCing back home and does not anticipate any HH/DMe needs. Admits he is anxious about being Dx'ed with Cancer.     Action/Plan:   Anticipated DC Date:  08/28/2013   Anticipated DC Plan:  HOME/SELF CARE      DC Planning Services  CM consult      Choice offered to / List presented to:             Status of service:  Completed, signed off Medicare Important Message given?   (If response is "NO", the following Medicare IM given date fields will be blank) Date Medicare IM given:   Date Additional Medicare IM given:    Discharge Disposition:  ACUTE TO ACUTE TRANS  Per UR Regulation:    If discussed at Long Length of Stay Meetings, dates discussed:   08/27/2013    Comments:  08/27/13 1515 Arlyss Queen, RN BSN CM Pt to transfer to [facility]. CM on assigned unit will follow for discharge planning needs.  08/26/13 Rosemary Holms RN BSN CM

## 2013-08-26 NOTE — Progress Notes (Signed)
PARENTERAL NUTRITION CONSULT NOTE  Pharmacy Consult for TPN Indication: Intolerance to enteral feeding, ulcerative esophagitis(secondary to gastric outlet obstruction). Hypoalbuminemia.  No Known Allergies  Patient Measurements: Height: 5\' 8"  (172.7 cm) Weight: 130 lb 3.2 oz (59.058 kg) IBW/kg (Calculated) : 68.4  Vital Signs: Temp: 98.1 F (36.7 C) (12/08 0543) Temp src: Oral (12/08 0543) BP: 117/70 mmHg (12/08 0543) Pulse Rate: 100 (12/08 1113) Intake/Output from previous day: 12/07 0701 - 12/08 0700 In: 815 [I.V.:554.2; IV Piggyback:150; TPN:110.8] Out: 2500 [Urine:1600; Emesis/NG output:900] Intake/Output from this shift: Total I/O In: -  Out: 350 [Urine:350]  Labs:  Recent Labs  08/24/13 0455 08/25/13 0617 08/26/13 0503  WBC 6.7 7.6 5.6  HGB 10.9* 10.8* 10.6*  HCT 31.3* 31.2* 30.5*  PLT 279 256 268     Recent Labs  08/24/13 0455 08/25/13 0617 08/26/13 0503 08/26/13 0506  NA 130* 137 139  --   K 3.3* 3.4* 3.8  --   CL 98 103 103  --   CO2 28 26 29   --   GLUCOSE 171* 146* 140*  --   BUN 10 7 9   --   CREATININE 0.77 0.68 0.67  --   CALCIUM 7.7* 7.8* 8.2*  --   MG 2.1  --  1.9  --   PHOS 2.1*  --  3.2  --   PROT 4.2*  --  4.5*  --   ALBUMIN 1.7*  --  1.5*  --   AST 14  --  143*  --   ALT 10  --  89*  --   ALKPHOS 39  --  341*  --   BILITOT 0.3  --  0.5  --   PREALBUMIN 6.7*  --   --   --   TRIG 98  --   --  61   Estimated Creatinine Clearance: 84.1 ml/min (by C-G formula based on Cr of 0.67).   Recent Labs  08/25/13 2356 08/26/13 0542 08/26/13 1218  GLUCAP 125* 130* 129*   Medical History: Past Medical History  Diagnosis Date  . Hypertension   . Reflux    Medications:  Scheduled:  . albuterol  2.5 mg Nebulization BID  . antiseptic oral rinse  15 mL Mouth Rinse q12n4p  . chlorhexidine  15 mL Mouth Rinse BID  . insulin aspart  0-9 Units Subcutaneous Q6H  . nicotine  14 mg Transdermal Daily  . pantoprazole (PROTONIX) IV  40 mg  Intravenous Q12H  . piperacillin-tazobactam  3.375 g Intravenous Q8H  . sodium chloride  10-40 mL Intracatheter Q12H   Insulin Requirements in the past 24 hours:  4 units Current Nutrition: Titrating TPN  Assessment: 57yo male with GOO.  .  Calcium corrects to WNL.  I/O [phone] Recent EGD showed ulcerative esophagitis (secondary to gastric outlet obstruction).  Sugars are borderline elevated but OK, plasma glucose improved today.  Pt on sliding scale insulin. Pt's LFT's are starting to increase, most likely due to glucose infusion from TPN.  Will need to reduce rate of TPN and monitor. Triglycerides are OK.  Dietician notes reviewed  Nutritional Goals:  2000 kCal, 90-100 grams of protein per day  Plan:  Decrease Clinimix E 5/15 to 65 ml/hr today Lipids @ 10 ml/hr (daily with Clinimix) Lipids, MVI, trace elements daily. Labs per protocol.  Valrie Hart A 08/26/2013,12:41 PM

## 2013-08-26 NOTE — Progress Notes (Signed)
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 Biopsies taken and pending. -Had over 2 liters of stomach contents removed. -There was concern for aspiration during procedure and some respiratory distress for which he was transferred to the ICU and started on Zosyn. -Is now maintaining good oxygen sats on [place] oxygen. -Will DC zosyn today. -Has NG that remains to suction. -Continue TPN. -C/o improved pain control today.  Bilateral Lower Extremity Edema -Likely related to malnutrition and hypoalbuminemia. -TNA to start today. -Almost resolved.  Hyponatremia -Related to dehydration. -Resolved.  Code Status: Full Code Family Communication: Wife [name] at bedside updated on plan of care. Disposition Plan: To be determined according to GI recommendations.   Consultants:  GI, Dr. Karilyn Cota   Antibiotics:  Zosyn 08/23/2013--> 08/26/13  Subjective: No complaints today.  Objective: Filed Vitals:   08/26/13 0746 08/26/13 1106 08/26/13 1113 08/26/13 1359  BP:    108/71  Pulse:   100 102  Temp:    98.3 F (36.8 C)  TempSrc:      Resp:   17 20  Height:      Weight:      SpO2: 99% 96% 96% 98%    Intake/Output Summary (Last 24 hours) at 08/26/13 1413 Last data filed at 08/26/13 0908  Gross per 24 hour  Intake    815 ml  Output   2550 ml  Net  -1735 ml   Filed Weights   08/22/13 1600 08/22/13 2307  Weight: 55.792 kg (123 lb) 59.058 kg (130 lb 3.2 oz)    Exam:   General:  AA Ox3  Cardiovascular: RRR  Respiratory: CTA B  Abdomen: S/NT/ND/+BS  Extremities: trace-1+pitting edema bilaterally.  Neurologic:  Grossly intact and non-focal.  Data Reviewed: Basic Metabolic Panel:  Recent Labs Lab 08/22/13 1727 08/23/13 0544 08/24/13 0455  08/25/13 0617 08/26/13 0503  NA 130* 133* 130* 137 139  K 3.9 3.9 3.3* 3.4* 3.8  CL 92* 97 98 103 103  CO2 31 30 28 26 29   GLUCOSE 108* 103* 171* 146* 140*  BUN 18 12 10 7 9   CREATININE 1.00 0.89 0.77 0.68 0.67  CALCIUM 8.6 8.3* 7.7* 7.8* 8.2*  MG  --   --  2.1  --  1.9  PHOS  --   --  2.1*  --  3.2   Liver Function Tests:  Recent Labs Lab 08/22/13 1727 08/23/13 0544 08/24/13 0455 08/26/13 0503  AST 12 14 14  143*  ALT 10 11 10  89*  ALKPHOS 48 47 39 341*  BILITOT 0.3 0.3 0.3 0.5  PROT 5.5* 5.1* 4.2* 4.5*  ALBUMIN 2.4* 2.3* 1.7* 1.5*    Recent Labs Lab 08/22/13 1727  LIPASE 61*   No results found for this basename: AMMONIA,  in the last 168 hours CBC:  Recent Labs Lab 08/22/13 1727 08/23/13 0544 08/24/13 0455 08/25/13 0617 08/26/13 0503  WBC 8.1 9.2 6.7 7.6 5.6  NEUTROABS 6.1  --  5.7  --  4.2  HGB 12.5* 13.6 10.9* 10.8* 10.6*  HCT 35.6* 39.2 31.3* 31.2* 30.5*  MCV 95.2 95.4 95.4 95.7 96.5  PLT 358 387 279 256 268   Cardiac Enzymes: No results found for this basename: CKTOTAL, CKMB, CKMBINDEX, TROPONINI,  in the last 168 hours BNP (last 3 results)  Recent Labs  08/22/13 1727  PROBNP 113.8   CBG:  Recent Labs Lab 08/25/13 1133 08/25/13 1643 08/25/13 2356 08/26/13 0542 08/26/13 1218  GLUCAP 135* 147* 125* 130* 129*    Recent Results (from the past 240 hour(s))  MRSA PCR SCREENING     Status: None   Collection Time    08/23/13  1:30 PM      Result Value Range Status   MRSA by PCR NEGATIVE  NEGATIVE Final   Comment:            The GeneXpert MRSA Assay (FDA     approved for NASAL specimens     only), is one component of a     comprehensive MRSA colonization     surveillance program. It is not     intended to diagnose MRSA     infection nor to guide or     monitor treatment for     MRSA infections.     Studies: No results found.  Scheduled Meds: . albuterol  2.5 mg Nebulization BID  . antiseptic oral rinse  15 mL Mouth Rinse  q12n4p  . chlorhexidine  15 mL Mouth Rinse BID  . insulin aspart  0-9 Units Subcutaneous Q6H  . nicotine  14 mg Transdermal Daily  . pantoprazole (PROTONIX) IV  40 mg Intravenous Q12H  . piperacillin-tazobactam  3.375 g Intravenous Q8H  . sodium chloride  10-40 mL Intracatheter Q12H   Continuous Infusions: . sodium chloride 30 mL/hr at 08/26/13 0800  . Marland KitchenTPN (CLINIMIX-E) Adult 65 mL/hr at 08/26/13 1358   And  . fat emulsion 250 mL (08/25/13 1722)  . Marland KitchenTPN (CLINIMIX-E) Adult     And  . fat emulsion      Principal Problem:   Weight loss, unintentional Active Problems:   GERD (gastroesophageal reflux disease)   Gastric wall thickening   Hyponatremia   Dehydration   Tobacco abuse   COPD (chronic obstructive pulmonary disease)   Pedal edema   Protein-calorie malnutrition, severe    Time spent: 25 minutes.  Greater than 50% of this time was spent in direct contact with the patient coordinating care.    Chaya Jan  [facility] Pager [phone]  If 7PM-7AM, please contact night-coverage at [email or web address], password [facility] 08/26/2013, 2:13 PM  LOS: 4 days

## 2013-08-26 NOTE — Progress Notes (Signed)
OT Cancellation Note  Patient Details Name: Randy Le MRN: [id number] DOB: [birth year]   Cancelled Treatment:    Reason Eval/Treat Not Completed: Medical issues which prohibited therapy We had received a request for an OT evaluation. Since that time, pt has been transferred to ICU due to respiratory distress during an EGD, however now back on floor. Due to change in medical status, OT evaluation not completed.  Will need new therapy order when medically appropriate if OTis still needed.   Velora Mediate, OTR/L  08/26/2013, 11:39 AM

## 2013-08-27 LAB — COMPREHENSIVE METABOLIC PANEL
AST: 50 U/L — ABNORMAL HIGH (ref 0–37)
BUN: 9 mg/dL (ref 6–23)
CO2: 31 mEq/L (ref 19–32)
Chloride: 102 mEq/L (ref 96–112)
Creatinine, Ser: 0.63 mg/dL (ref 0.50–1.35)
GFR calc Af Amer: >90 mL/min (ref >90)
GFR calc non Af Amer: >90 mL/min (ref >90)
Glucose, Bld: 121 mg/dL — ABNORMAL HIGH (ref 70–99)
Sodium: 137 mEq/L (ref 135–145)
Total Bilirubin: 0.3 mg/dL (ref 0.3–1.2)
Total Protein: 4.6 g/dL — ABNORMAL LOW (ref 6.0–8.3)

## 2013-08-27 LAB — GLUCOSE, CAPILLARY: Glucose-Capillary: 126 mg/dL — ABNORMAL HIGH (ref 70–99)

## 2013-08-27 MED ORDER — FAT EMULSION 20 % IV EMUL
250.0000 mL | INTRAVENOUS | Status: AC
  Administered 2013-08-27: 250 mL via INTRAVENOUS
  Filled 2013-08-27: qty 250

## 2013-08-27 MED ORDER — MORPHINE SULFATE 2 MG/ML IJ SOLN
2.0000 mg | INTRAMUSCULAR | Status: DC | PRN
  Administered 2013-08-27 – 2013-08-31 (×21): 2 mg via INTRAVENOUS
  Filled 2013-08-27 (×21): qty 1

## 2013-08-27 MED ORDER — TRACE MINERALS CR-CU-F-FE-I-MN-MO-SE-ZN IV SOLN
INTRAVENOUS | Status: AC
  Administered 2013-08-27: 17:00:00 via INTRAVENOUS
  Filled 2013-08-27: qty 2000

## 2013-08-27 NOTE — Progress Notes (Addendum)
NUTRITION FOLLOW UP  Intervention:   TPN titrated per pharmacy  Nutrition Dx:   Inadequate oral intake related to inability to eat, altered GI funtion as evidenced by NPO status, NG tube placed for gastric decompression; ongoing  Goal:   Pt to meet >/= 90% of their estimated nutrition needs; progressing  Monitor:   Nutrition Support measures, labs and weights   Assessment:  Pt on TPN since 12/5. Clinimix 5/15 @ 65 ml/hr with 250 ml 20% lipid emulsion, MVI, trace elements daily. Regimen delivers 1587 kcal, 78 gr amino acids, 48 gr fat daily which is 90% minimum est energy, 89% minimum est protein needs daily. Surgical consult pending. Pt is being transferred to [facility].  Height: Ht Readings from Last 1 Encounters:  08/22/13 5\' 8"  (1.727 m)    Weight Status:   Wt Readings from Last 1 Encounters:  08/22/13 130 lb 3.2 oz (59.058 kg)    Re-estimated needs:  Kcal: 2956-2130  Protein: 88-106 gr  Fluid: 1800 ml daily  Skin: Intact  Diet Order: NPO   Intake/Output Summary (Last 24 hours) at 08/27/13 1530 Last data filed at 08/27/13 1041  Gross per 24 hour  Intake 1805.33 ml  Output   2450 ml  Net -644.67 ml    Last BM: 08/22/13   Labs:   Recent Labs Lab 08/24/13 0455 08/25/13 0617 08/26/13 0503 08/27/13 0513  NA 130* 137 139 137  K 3.3* 3.4* 3.8 3.5  CL 98 103 103 102  CO2 28 26 29 31   BUN 10 7 9 9   CREATININE 0.77 0.68 0.67 0.63  CALCIUM 7.7* 7.8* 8.2* 8.1*  MG 2.1  --  1.9  --   PHOS 2.1*  --  3.2  --   GLUCOSE 171* 146* 140* 121*    CBG (last 3)   Recent Labs  08/26/13 2359 08/27/13 0603 08/27/13 1155  GLUCAP 122* 126* 114*    Scheduled Meds: . albuterol  2.5 mg Nebulization BID  . antiseptic oral rinse  15 mL Mouth Rinse q12n4p  . chlorhexidine  15 mL Mouth Rinse BID  . insulin aspart  0-9 Units Subcutaneous Q6H  . nicotine  14 mg Transdermal Daily  . pantoprazole (PROTONIX) IV  40 mg Intravenous Q12H  . piperacillin-tazobactam  3.375 g  Intravenous Q8H  . sodium chloride  10-40 mL Intracatheter Q12H    Continuous Infusions: . sodium chloride 30 mL/hr at 08/27/13 0553  . Marland KitchenTPN (CLINIMIX-E) Adult 65 mL/hr at 08/26/13 1810   And  . fat emulsion 250 mL (08/26/13 1810)  . Marland KitchenTPN (CLINIMIX-E) Adult     And  . fat emulsion      Royann Shivers MS,RD,CSG,LDN Office: [phone] Pager: [phone]

## 2013-08-27 NOTE — Progress Notes (Signed)
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 Biopsies taken and results inconclusive: pathologist requires more tissue samples. -Dr. Karilyn Cota has discussed with Dr. Christella Hartigan ([facility]) to perform an EUS with directed biopsies; and will require transfer to [facility]. -Had over 2 liters of stomach contents removed. -There was concern for aspiration during procedure and some respiratory distress for which he was transferred to the ICU and started on Zosyn. -Is now maintaining good oxygen sats on [place] oxygen. -Zosyn was discontinued 08/26/13. -Has NG that remains to suction. -Continue TPN. -C/o improved pain control today. -Will likely require a surgical evaluation as well.  Bilateral Lower Extremity Edema -Likely related to malnutrition and hypoalbuminemia. -TNA to start today. -Almost resolved.  Hyponatremia -Related to dehydration. -Resolved.  Code Status: Full Code Family Communication: Wife [name] at bedside updated on plan of care. Disposition Plan: Transfer to [facility] hospital for further treatment.   Consultants:  GI, Dr. Karilyn Cota   Antibiotics:  Zosyn 08/23/2013--> 08/26/13  Subjective: No complaints today.  Objective: Filed Vitals:   08/26/13 2250 08/27/13 0606 08/27/13 0728 08/27/13 1343  BP: 108/69 118/77  131/78  Pulse: 95 95  100  Temp: 97.9 F (36.6 C) 98.5 F (36.9 C)  98.1 F (36.7 C)  TempSrc: Oral Oral  Oral  Resp: 18 18  18   Height:      Weight:      SpO2: 99% 97% 98% 98%    Intake/Output Summary (Last 24 hours) at 08/27/13 1500 Last data filed at 08/27/13 1041  Gross per 24 hour  Intake 1805.33 ml  Output   2450 ml  Net -644.67 ml   Filed Weights   08/22/13 1600 08/22/13 2307  Weight: 55.792 kg (123 lb) 59.058 kg (130 lb  3.2 oz)    Exam:   General:  AA Ox3  Cardiovascular: RRR  Respiratory: CTA B  Abdomen: S/NT/ND/+BS  Extremities: trace-1+pitting edema bilaterally.  Neurologic:  Grossly intact and non-focal.  Data Reviewed: Basic Metabolic Panel:  Recent Labs Lab 08/23/13 0544 08/24/13 0455 08/25/13 0617 08/26/13 0503 08/27/13 0513  NA 133* 130* 137 139 137  K 3.9 3.3* 3.4* 3.8 3.5  CL 97 98 103 103 102  CO2 30 28 26 29 31   GLUCOSE 103* 171* 146* 140* 121*  BUN 12 10 7 9 9   CREATININE 0.89 0.77 0.68 0.67 0.63  CALCIUM 8.3* 7.7* 7.8* 8.2* 8.1*  MG  --  2.1  --  1.9  --   PHOS  --  2.1*  --  3.2  --    Liver Function Tests:  Recent Labs Lab 08/22/13 1727 08/23/13 0544 08/24/13 0455 08/26/13 0503 08/27/13 0513  AST 12 14 14  143* 50*  ALT 10 11 10  89* 78*  ALKPHOS 48 47 39 341* 427*  BILITOT 0.3 0.3 0.3 0.5 0.3  PROT 5.5* 5.1* 4.2* 4.5* 4.6*  ALBUMIN 2.4* 2.3* 1.7* 1.5* 1.6*    Recent Labs Lab 08/22/13 1727  LIPASE 61*   No results found for this basename: AMMONIA,  in the last 168 hours CBC:  Recent Labs Lab 08/22/13 1727 08/23/13 0544 08/24/13 0455 08/25/13 0617 08/26/13 0503  WBC 8.1 9.2 6.7 7.6 5.6  NEUTROABS 6.1  --  5.7  --  4.2  HGB 12.5* 13.6 10.9* 10.8* 10.6*  HCT 35.6* 39.2  31.3* 31.2* 30.5*  MCV 95.2 95.4 95.4 95.7 96.5  PLT 358 387 279 256 268   Cardiac Enzymes: No results found for this basename: CKTOTAL, CKMB, CKMBINDEX, TROPONINI,  in the last 168 hours BNP (last 3 results)  Recent Labs  08/22/13 1727  PROBNP 113.8   CBG:  Recent Labs Lab 08/26/13 1218 08/26/13 1618 08/26/13 2359 08/27/13 0603 08/27/13 1155  GLUCAP 129* 109* 122* 126* 114*    Recent Results (from the past 240 hour(s))  MRSA PCR SCREENING     Status: None   Collection Time    08/23/13  1:30 PM      Result Value Range Status   MRSA by PCR NEGATIVE  NEGATIVE Final   Comment:            The GeneXpert MRSA Assay (FDA     approved for NASAL specimens      only), is one component of a     comprehensive MRSA colonization     surveillance program. It is not     intended to diagnose MRSA     infection nor to guide or     monitor treatment for     MRSA infections.     Studies: No results found.  Scheduled Meds: . albuterol  2.5 mg Nebulization BID  . antiseptic oral rinse  15 mL Mouth Rinse q12n4p  . chlorhexidine  15 mL Mouth Rinse BID  . insulin aspart  0-9 Units Subcutaneous Q6H  . nicotine  14 mg Transdermal Daily  . pantoprazole (PROTONIX) IV  40 mg Intravenous Q12H  . piperacillin-tazobactam  3.375 g Intravenous Q8H  . sodium chloride  10-40 mL Intracatheter Q12H   Continuous Infusions: . sodium chloride 30 mL/hr at 08/27/13 0553  . Marland KitchenTPN (CLINIMIX-E) Adult 65 mL/hr at 08/26/13 1810   And  . fat emulsion 250 mL (08/26/13 1810)  . Marland KitchenTPN (CLINIMIX-E) Adult     And  . fat emulsion      Principal Problem:   Weight loss, unintentional Active Problems:   GERD (gastroesophageal reflux disease)   Gastric wall thickening   Hyponatremia   Dehydration   Tobacco abuse   COPD (chronic obstructive pulmonary disease)   Pedal edema   Protein-calorie malnutrition, severe    Time spent: 35 minutes.  Greater than 50% of this time was spent in direct contact with the patient coordinating care.    Chaya Jan  [facility] Pager [phone]  If 7PM-7AM, please contact night-coverage at [email or web address], password [facility] 08/27/2013, 3:00 PM  LOS: 5 days

## 2013-08-27 NOTE — Progress Notes (Signed)
Subjective; Patient denies chest or abdominal pain. He continues to be bothered by NG tube resulting in sore throat. He denies shortness of breath. Objective; BP 118/77  Pulse 95  Temp(Src) 98.5 F (36.9 C) (Oral)  Resp 18  Ht 5\' 8"  (1.727 m)  Wt 130 lb 3.2 oz (59.058 kg)  BMI 19.80 kg/m2  SpO2 98% Patient is alert and in no acute distress. Abdomen is flat with firm nodular mass in the epigastric region. No hepatosplenomegaly. Lab data; Bilirubin 0.3, AP 427, AST 50, ALT 78, albumin 1.6 and calcium 8.1. Prealbumin was 6 on 08/26/2013 Gastric biopsy reveals body and antral type mucosa with marked atypical cells infiltrating into the submucosa and highlighted by cytokeratin AE1 and AE3 stains. Definite diagnosis however cannot be made and more tissue requested.  Assessment; #1. 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 Biopsies were taken with jumbo forceps at the time of EGD but diagnosis cannot be confirmed. We may run into same problem and repeat EGD. Therefore would recommend EUS with deeper and directed biopsies Patient will therefore need to be transferred to [facility]. I have already contacted Dr. Joesph Fillers who can be consulted when patient is transferred. He will also need surgical consultation.  #2. Abnormal LFTs with coldly static pattern. Suspect this abnormality is secondary to parenteral nutrition. Transaminases and alkaline phosphatase were normal at the time of admission. Doubt that he is developing CBD obstruction. Bile duct can be evaluated at the time of EUS. #3. Malnutrition. Patient has very low albumin and prealbumin and he has been on parenteral nutrition since 08/23/2013. #4. COPD. There was concern for aspiration prior to EGD. He remains afebrile. Therefore Zosyn will discontinued

## 2013-08-27 NOTE — Progress Notes (Signed)
PARENTERAL NUTRITION CONSULT NOTE  Pharmacy Consult for TPN Indication: Intolerance to enteral feeding, ulcerative esophagitis(secondary to gastric outlet obstruction). Hypoalbuminemia.  No Known Allergies  Patient Measurements: Height: 5\' 8"  (172.7 cm) Weight: 130 lb 3.2 oz (59.058 kg) IBW/kg (Calculated) : 68.4  Vital Signs: Temp: 98.5 F (36.9 C) (12/09 0606) Temp src: Oral (12/09 0606) BP: 118/77 mmHg (12/09 0606) Pulse Rate: 95 (12/09 0606) Intake/Output from previous day: 12/08 0701 - 12/09 0700 In: 1805.3 [P.O.:180; I.V.:421; TPN:1204.3] Out: 2475 [Urine:1775; Emesis/NG output:700] Intake/Output from this shift: Total I/O In: -  Out: 325 [Urine:325]  Labs:  Recent Labs  08/25/13 0617 08/26/13 0503  WBC 7.6 5.6  HGB 10.8* 10.6*  HCT 31.2* 30.5*  PLT 256 268     Recent Labs  08/25/13 0617 08/26/13 0503 08/26/13 0506 08/27/13 0513  NA 137 139  --  137  K 3.4* 3.8  --  3.5  CL 103 103  --  102  CO2 26 29  --  31  GLUCOSE 146* 140*  --  121*  BUN 7 9  --  9  CREATININE 0.68 0.67  --  0.63  CALCIUM 7.8* 8.2*  --  8.1*  MG  --  1.9  --   --   PHOS  --  3.2  --   --   PROT  --  4.5*  --  4.6*  ALBUMIN  --  1.5*  --  1.6*  AST  --  143*  --  50*  ALT  --  89*  --  78*  ALKPHOS  --  341*  --  427*  BILITOT  --  0.5  --  0.3  PREALBUMIN  --  6.0*  --   --   TRIG  --   --  61  --    Estimated Creatinine Clearance: 84.1 ml/min (by C-G formula based on Cr of 0.63).   Recent Labs  08/26/13 1618 08/26/13 2359 08/27/13 0603  GLUCAP 109* 122* 126*   Medical History: Past Medical History  Diagnosis Date  . Hypertension   . Reflux    Medications:  Scheduled:  . albuterol  2.5 mg Nebulization BID  . antiseptic oral rinse  15 mL Mouth Rinse q12n4p  . chlorhexidine  15 mL Mouth Rinse BID  . insulin aspart  0-9 Units Subcutaneous Q6H  . nicotine  14 mg Transdermal Daily  . pantoprazole (PROTONIX) IV  40 mg Intravenous Q12H  .  piperacillin-tazobactam  3.375 g Intravenous Q8H  . sodium chloride  10-40 mL Intracatheter Q12H   Insulin Requirements in the past 24 hours:  2 units  Current Nutrition: Titrating TPN  Assessment: 57yo male with GOO.  Recent EGD showed ulcerative esophagitis (secondary to gastric outlet obstruction).   Electrolytes are at goal.  Calcium corrects to WNL.  I/O - 669.7  Glucose is <150.  Pt on sliding scale insulin. Alk Phos continues to trend up, but other LFTs are unchanged or improved with decrease in glucose infusion rate.    Will continue with reduced rate of TPN and monitor. Triglycerides are OK.  Dietician notes reviewed.  Nutritional Goals:  2000 kCal, 90-100 grams of protein per day  Plan:  Continue Clinimix E 5/15 at 65 ml/hr today Lipids @ 10 ml/hr (daily with Clinimix) Lipids, MVI, trace elements daily. Labs per protocol.  Elson Clan 08/27/2013,11:56 AM

## 2013-08-28 ENCOUNTER — Ambulatory Visit (INDEPENDENT_AMBULATORY_CARE_PROVIDER_SITE_OTHER): Payer: Federal, State, Local not specified - PPO | Admitting: Internal Medicine

## 2013-08-28 ENCOUNTER — Encounter (HOSPITAL_COMMUNITY): Payer: Self-pay | Admitting: Internal Medicine

## 2013-08-28 DIAGNOSIS — R933 Abnormal findings on diagnostic imaging of other parts of digestive tract: Secondary | ICD-10-CM

## 2013-08-28 DIAGNOSIS — K311 Adult hypertrophic pyloric stenosis: Secondary | ICD-10-CM | POA: Diagnosis present

## 2013-08-28 DIAGNOSIS — R188 Other ascites: Secondary | ICD-10-CM | POA: Diagnosis present

## 2013-08-28 DIAGNOSIS — E43 Unspecified severe protein-calorie malnutrition: Secondary | ICD-10-CM

## 2013-08-28 LAB — GLUCOSE, CAPILLARY
Glucose-Capillary: 125 mg/dL — ABNORMAL HIGH (ref 70–99)
Glucose-Capillary: 126 mg/dL — ABNORMAL HIGH (ref 70–99)

## 2013-08-28 MED ORDER — FAT EMULSION 20 % IV EMUL
250.0000 mL | INTRAVENOUS | Status: AC
  Administered 2013-08-28: 250 mL via INTRAVENOUS
  Filled 2013-08-28: qty 250

## 2013-08-28 MED ORDER — TRACE MINERALS CR-CU-F-FE-I-MN-MO-SE-ZN IV SOLN
INTRAVENOUS | Status: AC
  Administered 2013-08-28: 18:00:00 via INTRAVENOUS
  Filled 2013-08-28: qty 2000

## 2013-08-28 NOTE — Consult Note (Signed)
Reason for Consult:Gastric outlet obstruction with possible cancer. Referring Physician: Dr. Rob Bunting  Randy Le is an 57 y.o. male.  HPI: Pt reports about 5 weeks ago he started experiencing some abdominal discomfort, he describes as like a stomach ache, but not bad.  It was not related to food, or activity.  He describes a burning sensation that became progressively worse.  It was helped initially with TUMS but that became ineffective.  He then started to have some nausea and lost his appetite. He was seen in November with these symptoms and diagnosed with reflux.  He started PPI's but it didn't really help.  He forced himself to eat, and then developed nausea and vomiting.  He also was developing swelling both lower legs.  He presented to the ER at [facility] and was admitted by Medicine and underwent EGD by Dr. Karilyn Cota on 08/23/13. He requird drainage of 2.5 liters of gastric content before EGD could be done.  EGD showed:  Ulcerative esophagitis(secondary to gastric outlet obstruction). large stomach with food debris. Abnormal antral mucosa with nodularity and friability and pyloric channel stenosis.  Multiple biopsies taken.  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 Pathology was indeterminate:   Stomach, biopsy, lesion at gastric antrum - POLYPOID FRAGMENTS OF GASTRIC BODY AND ANTRAL TYPE MUCOSA WITH MARKED ATYPICAL CELLS. Dr. Karilyn Cota recommended he be transferred to [facility] for EUS and deep biopsy.  He was started on TNA on 08/23/13.  Prealbumin was 6.0 on 08/25/13.  LFT's have risen since he was started on TNA.  We have been ask to see by Dr. Christella Hartigan to help plan care.   Past Medical History  Diagnosis Date  . Hypertension  10 year    Tobacco use 1-2 PPD for 38 years COPD    HX of heavy ETOH in his youth,  Social now.    Internal hernia/SBO with 3 feet of ileum resection 04/08/11        Past Surgical History  Procedure Laterality Date  . Hernia repair     . Laparotomy  04/08/2011    Procedure: EXPLORATORY LAPAROTOMY;  Surgeon: Dalia Heading;  Location: [facility] ORS;  Service: General;  Laterality: N/A;  Exploratory Laparotomy with Partial Small Bowel Resection  . Small intestiine surgery    . External ear surgery    . Esophagogastroduodenoscopy N/A 08/23/2013    Procedure: ESOPHAGOGASTRODUODENOSCOPY (EGD);  Surgeon: Malissa Hippo, MD;  Location: [facility] ENDO SUITE;  Service: Endoscopy;  Laterality: N/A;    History reviewed. No pertinent family history.  Social History:  reports that he has been smoking Cigarettes.  He has been smoking about 1.00 pack per day. He does not have any smokeless tobacco history on file. He reports that he drinks alcohol. He reports that he does not use illicit drugs. Tobacco 1-2 PPD 38 years EOTH:  Heavy use 10-15 years in his youth, now social drinking mostly beer on weekends. Drug:  None Married  Chartered certified accountant Allergies: No Known Allergies  Medications:  Prior to Admission:  Prescriptions prior to admission  Medication Sig Dispense Refill  . metoprolol (TOPROL-XL) 100 MG 24 hr tablet Take 100 mg by mouth every morning.       . Multiple Vitamin (MULTIVITAMIN) capsule Take 1 capsule by mouth daily.        Marland Kitchen NEXIUM 40 MG capsule Take 40 mg by mouth daily.       Scheduled: . albuterol  2.5 mg Nebulization BID  . antiseptic oral rinse  15 mL Mouth Rinse q12n4p  . chlorhexidine  15 mL Mouth Rinse BID  . insulin aspart  0-9 Units Subcutaneous Q6H  . nicotine  14 mg Transdermal Daily  . pantoprazole (PROTONIX) IV  40 mg Intravenous Q12H  . piperacillin-tazobactam  3.375 g Intravenous Q8H  . sodium chloride  10-40 mL Intracatheter Q12H   Continuous: . sodium chloride 30 mL/hr at 08/27/13 2000  . Marland KitchenTPN (CLINIMIX-E) Adult 65 mL/hr at 08/27/13 1727   And  . fat emulsion 250 mL (08/27/13 1726)  . Marland KitchenTPN (CLINIMIX-E) Adult     And  . fat emulsion     AVW:[id number], acetaminophen, albuterol, alum & mag  hydroxide-simeth, morphine injection, ondansetron (ZOFRAN) IV, ondansetron, phenol, sodium chloride Anti-infectives   Start     Dose/Rate Route Frequency Ordered Stop   08/23/13 1400  piperacillin-tazobactam (ZOSYN) IVPB 3.375 g     3.375 g 12.5 mL/hr over 240 Minutes Intravenous 3 times per day 08/23/13 1247        Results for orders placed during the hospital encounter of 08/22/13 (from the past 48 hour(s))  GLUCOSE, CAPILLARY     Status: Abnormal   Collection Time    08/26/13  4:18 PM      Result Value Range   Glucose-Capillary 109 (*) 70 - 99 mg/dL  GLUCOSE, CAPILLARY     Status: Abnormal   Collection Time    08/26/13 11:59 PM      Result Value Range   Glucose-Capillary 122 (*) 70 - 99 mg/dL   Comment 1 Notify RN    COMPREHENSIVE METABOLIC PANEL     Status: Abnormal   Collection Time    08/27/13  5:13 AM      Result Value Range   Sodium 137  135 - 145 mEq/L   Potassium 3.5  3.5 - 5.1 mEq/L   Chloride 102  96 - 112 mEq/L   CO2 31  19 - 32 mEq/L   Glucose, Bld 121 (*) 70 - 99 mg/dL   BUN 9  6 - 23 mg/dL   Creatinine, Ser 6.21  0.50 - 1.35 mg/dL   Calcium 8.1 (*) 8.4 - 10.5 mg/dL   Total Protein 4.6 (*) 6.0 - 8.3 g/dL   Albumin 1.6 (*) 3.5 - 5.2 g/dL   AST 50 (*) 0 - 37 U/L   ALT 78 (*) 0 - 53 U/L   Alkaline Phosphatase 427 (*) 39 - 117 U/L   Total Bilirubin 0.3  0.3 - 1.2 mg/dL   GFR calc non Af Amer >90  >90 mL/min   GFR calc Af Amer >90  >90 mL/min   Comment: (NOTE)     The eGFR has been calculated using the CKD EPI equation.     This calculation has not been validated in all clinical situations.     eGFR's persistently <90 mL/min signify possible Chronic Kidney     Disease.  GLUCOSE, CAPILLARY     Status: Abnormal   Collection Time    08/27/13  6:03 AM      Result Value Range   Glucose-Capillary 126 (*) 70 - 99 mg/dL   Comment 1 Notify RN    GLUCOSE, CAPILLARY     Status: Abnormal   Collection Time    08/27/13 11:55 AM      Result Value Range    Glucose-Capillary 114 (*) 70 - 99 mg/dL   Comment 1 Notify RN     Comment 2 Documented in Chart    GLUCOSE,  CAPILLARY     Status: Abnormal   Collection Time    08/27/13  5:49 PM      Result Value Range   Glucose-Capillary 108 (*) 70 - 99 mg/dL   Comment 1 Notify RN     Comment 2 Documented in Chart    GLUCOSE, CAPILLARY     Status: Abnormal   Collection Time    08/28/13 12:54 AM      Result Value Range   Glucose-Capillary 125 (*) 70 - 99 mg/dL   Comment 1 Documented in Chart     Comment 2 Notify RN    GLUCOSE, CAPILLARY     Status: Abnormal   Collection Time    08/28/13 12:26 PM      Result Value Range   Glucose-Capillary 126 (*) 70 - 99 mg/dL    No results found.  Review of Systems  Constitutional: Positive for weight loss (20-25 pounds the last 5 weeks) and malaise/fatigue. Negative for fever and chills.  HENT: Negative.        Severe reflux with trouble swallowing  Eyes: Negative.   Respiratory: Positive for cough and wheezing. Negative for hemoptysis, sputum production and shortness of breath.   Cardiovascular: Positive for leg swelling.  Gastrointestinal: Positive for heartburn, nausea, vomiting, abdominal pain and diarrhea (chronic since small bowel resection for internal hernia 04/08/11.). Negative for blood in stool and melena.  Genitourinary: Negative.   Musculoskeletal: Negative.   Skin: Negative.   Neurological: Positive for weakness.  Endo/Heme/Allergies: Negative.   Psychiatric/Behavioral: Negative.    Blood pressure 125/76, pulse 87, temperature 98.1 F (36.7 C), temperature source Oral, resp. rate 18, height 5\' 8"  (1.727 m), weight 59.058 kg (130 lb 3.2 oz), SpO2 100.00%. Physical Exam  Constitutional: He is oriented to person, place, and time. No distress.  Thin cachectic male NAD TNA is infusing.  HENT:  Head: Normocephalic and atraumatic.  Nose: Nose normal.  NG in place  Eyes: Conjunctivae and EOM are normal. Pupils are equal, round, and reactive  to light. Right eye exhibits no discharge. Left eye exhibits no discharge. No scleral icterus.  Neck: Normal range of motion. Neck supple. No JVD present. No tracheal deviation present. No thyromegaly present.  Cardiovascular: Normal rate, regular rhythm, normal heart sounds and intact distal pulses.  Exam reveals no gallop.   No murmur heard. Trace lower leg edema, bilateral, much improved since admission.  Respiratory: Effort normal and breath sounds normal. No respiratory distress. He has no wheezes. He has no rales. He exhibits no tenderness.  GI: Soft. Bowel sounds are normal. He exhibits no distension and no mass. There is tenderness (he is tender over right and left portions of the abodmen.). There is no rebound and no guarding.  Musculoskeletal: He exhibits no edema and no tenderness.  Lymphadenopathy:    He has no cervical adenopathy.  Neurological: He is alert and oriented to person, place, and time. No cranial nerve deficit.  Skin: Skin is warm and dry. No rash noted. He is not diaphoretic. No erythema. No pallor.  Psychiatric: He has a normal mood and affect. His behavior is normal. Judgment and thought content normal.    Assessment/Plan: 1.  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 2.  Hypertension 3.  COPD/TOBACCO USE 4.  Malnutrition  (prealbumin 6.0 08/25/13)  Currently on TNA 5.  Hx of ETOH use,  6.  New LFT elevations on TNA. 7.  Lower leg swelling, now improving with TNA.  Plan:   He needs medical clearance for surgery.  He needs improved nutrition, and we would most likely recommend a tissue biopsy to help determine the best approach.   I have discussed this with Dr. Johna Sheriff, he will see and after exam make some further recommendations.   Haille Pardi 08/28/2013, 2:10 PM

## 2013-08-28 NOTE — Consult Note (Signed)
Referring Provider: No ref. provider found Primary Care Physician:  Kirk Ruths, MD Primary Gastroenterologist:  Gentry Fitz; GI in [place]  Reason for Consultation:  GOO; need for EUS and deeper biopsies  HPI: Randy Le is a 57 y.o. male with a past medical history of hypertension and COPD who presented to [facility] ED 08/22/2013 with reports of abdominal pain associated with 25 lbs weight loss over 5 weeks prior to this admission, poor oral intake, generalized weakness.  Underwent EGD.   EGD findings as follows:  Impression:  Ulcerative esophagitis (secondary to gastric outlet obstruction).  Large stomach with food debris. Abnormal antral mucosa with nodularity and friability and pyloric channel stenosis.  Multiple biopsies taken.  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   During the procedure he was refluxing gastric contents into his hypopharynx.  Concern for aspiration.  Being treated with Zosyn.  Biopsies showed the following:  Stomach, biopsy, lesion at gastric antrum - POLYPOID FRAGMENTS OF GASTRIC BODY AND ANTRAL TYPE MUCOSA WITH MARKED ATYPICAL CELLS.  Prior to admission he had a CT scan on 11/25, which showed the following:  IMPRESSION:  1. Prominent gastric antral wall thickening may be due to tumor or  inflammation. Stomach body is distended as is the esophagus.  Patulous gastroesophageal junction with reflux.  2. Diffuse 3rd spacing of fluid in the subcutaneous and mesenteric  adipose tissue.  3. Cachexia  4. Airway thickening in the lower lobes, favoring bronchitis.  5. A 4 mm nodule in the left upper lobe. If the patient is at high  risk for bronchogenic carcinoma, follow-up chest CT at 1 year is  recommended. If the patient is at low risk, no follow-up is needed.  This recommendation follows the consensus statement: Guidelines for Management of Small Pulmonary Nodules Detected on CT Scans: A Statement from the Fleischner  Society as published in Radiology 2005; 237:395-400.  6. Stable adrenal adenomas.  7. Moderate ascites.  8. Prominent prostate gland  9. Lower lumbar facet arthropathy.  At some point NGT was placed at some point and he was placed on TNA.  GOO was not resolving well and surgery in [place] did not feel comfortable with the case.  Also, GI in [place] felt that he may need EUS for deeper biopsies.  He was transferred to [facility] the evening of 12/9.  [facility] is evaluating him as well, but think that he will benefit from EUS before surgery.  Discussion to take place amongst Dr. Marina Goodell, Dr. Christella Hartigan, and Dr. Johna Sheriff.  LFT's have been elevated for the past two days and were completely normal on admission.  Total bili is 0.3, ALP 427, AST 50, and ALT 78 yesterday.  Thought to be cholestatic, possibly due to TNA.    Past Medical History  Diagnosis Date  . Hypertension   . Reflux     Past Surgical History  Procedure Laterality Date  . Hernia repair    . Laparotomy  04/08/2011    Procedure: EXPLORATORY LAPAROTOMY;  Surgeon: Dalia Heading;  Location: [facility] ORS;  Service: General;  Laterality: N/A;  Exploratory Laparotomy with Partial Small Bowel Resection  . Small intestiine surgery    . External ear surgery    . Esophagogastroduodenoscopy N/A 08/23/2013    Procedure: ESOPHAGOGASTRODUODENOSCOPY (EGD);  Surgeon: Malissa Hippo, MD;  Location: [facility] END[facility];  Service: Endoscopy;  Laterality: N/A;    Prior to Admission medications   Medication Sig Start Date End Date Taking? Authorizing Provider  metoprolol (TOPROL-XL)  100 MG 24 hr tablet Take 100 mg by mouth every morning.    Yes Historical Provider, MD  Multiple Vitamin (MULTIVITAMIN) capsule Take 1 capsule by mouth daily.     Yes Historical Provider, MD  NEXIUM 40 MG capsule Take 40 mg by mouth daily. 08/14/13  Yes Historical Provider, MD    Current Facility-Administered Medications  Medication Dose Route Frequency Provider Last Rate Last Dose  .  0.9 %  sodium chloride infusion   Intravenous Continuous Henderson Cloud, MD 30 mL/hr at 08/27/13 2000    . acetaminophen (TYLENOL) tablet 650 mg  650 mg Oral Q6H PRN Osvaldo Shipper, MD       Or  . acetaminophen (TYLENOL) suppository 650 mg  650 mg Rectal Q6H PRN Osvaldo Shipper, MD      . albuterol (PROVENTIL) (5 MG/ML) 0.5% nebulizer solution 2.5 mg  2.5 mg Nebulization Q2H PRN Osvaldo Shipper, MD   2.5 mg at 08/23/13 1035  . albuterol (PROVENTIL) (5 MG/ML) 0.5% nebulizer solution 2.5 mg  2.5 mg Nebulization BID Henderson Cloud, MD   2.5 mg at 08/28/13 0730  . alum & mag hydroxide-simeth (MAALOX/MYLANTA) 200-200-20 MG/5ML suspension 30 mL  30 mL Oral Q6H PRN Osvaldo Shipper, MD   30 mL at 08/22/13 2344  . antiseptic oral rinse (BIOTENE) solution 15 mL  15 mL Mouth Rinse q12n4p Maretta Bees, MD   15 mL at 08/28/13 1200  . chlorhexidine (PERIDEX) 0.12 % solution 15 mL  15 mL Mouth Rinse BID Maretta Bees, MD   15 mL at 08/28/13 0800  . TPN (CLINIMIX-E) Adult   Intravenous Continuous TPN Elson Clan, RPH 65 mL/hr at 08/27/13 1727     And  . fat emulsion 20 % infusion 250 mL  250 mL Intravenous Continuous TPN Mer[place], RPH 10 mL/hr at 08/27/13 1726 250 mL at 08/27/13 1726  . TPN (CLINIMIX-E) Adult   Intravenous Continuous TPN Loma Messing [place], [facility]       And  . fat emulsion 20 % infusion 250 mL  250 mL Intravenous Continuous TPN Loma Messing [place], RPH      . insulin aspart (novoLOG) injection 0-9 Units  0-9 Units Subcutaneous Q6H Malissa Hippo, MD   1 Units at 08/28/13 1235  . morphine 2 MG/ML injection 2 mg  2 mg Intravenous Q3H PRN Leda Gauze, NP   2 mg at 08/28/13 1227  . nicotine (NICODERM CQ - dosed in mg/24 hours) patch 14 mg  14 mg Transdermal Daily Osvaldo Shipper, MD   14 mg at 08/28/13 1025  . ondansetron (ZOFRAN) tablet 4 mg  4 mg Oral Q6H PRN Osvaldo Shipper, MD       Or  . ondansetron (ZOFRAN) injection 4  mg  4 mg Intravenous Q6H PRN Osvaldo Shipper, MD   4 mg at 08/24/13 0747  . pantoprazole (PROTONIX) injection 40 mg  40 mg Intravenous Q12H Malissa Hippo, MD   40 mg at 08/28/13 1025  . phenol (CHLORASEPTIC) mouth spray 1 spray  1 spray Mouth/Throat PRN Jinger Neighbors, NP   1 spray at 08/23/13 2249  . piperacillin-tazobactam (ZOSYN) IVPB 3.375 g  3.375 g Intravenous Q8H Malissa Hippo, MD   3.375 g at 08/28/13 0830  . sodium chloride 0.9 % injection 10-40 mL  10-40 mL Intracatheter Q12H Estela Isaiah Blakes, MD   20 mL at 08/28/13 1026  . sodium chloride 0.9 % injection 10-40 mL  10-40 mL Intracatheter PRN Henderson Cloud, MD   30 mL at 08/23/13 1545    Allergies as of 08/22/2013  . (No Known Allergies)    History reviewed. No pertinent family history.  History   Social History  . Marital Status: Married    Spouse Name: N/A    Number of Children: N/A  . Years of Education: N/A   Occupational History  . Not on file.   Social History Main Topics  . Smoking status: Current Every Day Smoker -- 1.00 packs/day    Types: Cigarettes  . Smokeless tobacco: Not on file  . Alcohol Use: 0.0 oz/week    3-4 Cans of beer per week     Comment: daily  . Drug Use: No  . Sexual Activity: Not on file   Other Topics Concern  . Not on file   Social History Narrative  . No narrative on file    Review of Systems: Ten point ROS is O/W negative except as mentioned in HPI.  Physical Exam: Vital signs in last 24 hours: Temp:  [97.5 F (36.4 C)-98.5 F (36.9 C)] 97.5 F (36.4 C) (12/10 1400) Pulse Rate:  [87-97] 89 (12/10 1400) Resp:  [18-20] 20 (12/10 1400) BP: (105-135)/(64-77) 105/64 mmHg (12/10 1400) SpO2:  [95 %-100 %] 100 % (12/10 1400) Last BM Date: 08/22/13 General:   Alert,  Well-developed, well-nourished, pleasant and cooperative in NAD Head:  Normocephalic and atraumatic. Eyes:  Sclera clear, no icterus.  Conjunctiva pink. Ears:  Normal auditory acuity. Mouth:   No deformity or lesions.   Lungs:  Clear throughout to auscultation.  No wheezes, crackles, or rhonchi.  Heart:  Regular rate and rhythm; no murmurs, clicks, rubs,  or gallops. Abdomen:  Soft,nontender, BS active,nonpalp mass or hsm.   Rectal:  Deferred  Msk:  Symmetrical without gross deformities. Pulses:  Normal pulses noted. Extremities:  Without clubbing or edema. Neurologic:  Alert and  oriented x4;  grossly normal neurologically. Skin:  Intact without significant lesions or rashes. Psych:  Alert and cooperative. Normal mood and affect.  Intake/Output from previous day: 12/09 0701 - 12/10 0700 In: -  Out: 1900 [Urine:650; Emesis/NG output:1250] Intake/Output this shift: Total I/O In: -  Out: 250 [Urine:250]  Lab Results:  Recent Labs  08/26/13 0503  WBC 5.6  HGB 10.6*  HCT 30.5*  PLT 268   BMET  Recent Labs  08/26/13 0503 08/27/13 0513  NA 139 137  K 3.8 3.5  CL 103 102  CO2 29 31  GLUCOSE 140* 121*  BUN 9 9  CREATININE 0.67 0.63  CALCIUM 8.2* 8.1*   LFT  Recent Labs  08/27/13 0513  PROT 4.6*  ALBUMIN 1.6*  AST 50*  ALT 78*  ALKPHOS 427*  BILITOT 0.3   IMPRESSION:  #1. 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 Biopsies were taken with jumbo forceps at the time of EGD but diagnosis cannot be confirmed.  #2. Abnormal LFTs with cholestatic pattern. Suspect this abnormality is secondary to parenteral nutrition. Transaminases and alkaline phosphatase were normal at the time of admission. Doubt that he is developing CBD obstruction. Bile duct can be possibly evaluated at the time of EUS if thought necessary and if performed.  #3. Malnutrition. Patient has very low albumin and prealbumin and he has been on parenteral nutrition since 08/23/2013.     PLAN: -Will tentatively schedule for EUS for 12/11, but finally decision pending discussion amongst Dr. Marina Goodell, Dr. Christella Hartigan,  and Dr. Johna Sheriff.   ZEHR,  JESSICA D.  08/28/2013, 3:07 PM  Pager number [phone]  GI ATTENDING  History, laboratories, x-rays, recent endoscopy report review. Patient personally seen and examined. Agree with H&P as outlined above. Patient presents with persistent gastric outlet obstruction and significantly abnormal CT scan and EGD as outlined. This is most certainly malignant process involving the gastric antrum. Ascites worrisome. 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 At this point, he needs surgery. Timing per general surgery. I reviewed the case with Dr. Christella Hartigan. It does not seem that endoscopic ultrasound would alter management. He needs surgery for diagnostic and therapeutic purposes. In the interim, continue with NG decompression and nutritional support. Discussed with patient.  Wilhemina Bonito. Eda Keys., M.D. [facility] Division of Gastroenterology

## 2013-08-28 NOTE — Progress Notes (Signed)
PARENTERAL NUTRITION CONSULT NOTE  Pharmacy Consult for TPN Indication: Intolerance to enteral feeding, ulcerative esophagitis(secondary to gastric outlet obstruction). Hypoalbuminemia.  No Known Allergies  Patient Measurements: Height: 5\' 8"  (172.7 cm) Weight: 130 lb 3.2 oz (59.058 kg) IBW/kg (Calculated) : 68.4  Vital Signs: Temp: 98.1 F (36.7 C) (12/10 0500) Temp src: Oral (12/10 0500) BP: 125/76 mmHg (12/10 0500) Pulse Rate: 87 (12/10 0500) Intake/Output from previous day: 12/09 0701 - 12/10 0700 In: -  Out: 1900 [Urine:650; Emesis/NG output:1250] Intake/Output from this shift:    Labs:  Recent Labs  08/26/13 0503  WBC 5.6  HGB 10.6*  HCT 30.5*  PLT 268     Recent Labs  08/26/13 0503 08/26/13 0506 08/27/13 0513  NA 139  --  137  K 3.8  --  3.5  CL 103  --  102  CO2 29  --  31  GLUCOSE 140*  --  121*  BUN 9  --  9  CREATININE 0.67  --  0.63  CALCIUM 8.2*  --  8.1*  MG 1.9  --   --   PHOS 3.2  --   --   PROT 4.5*  --  4.6*  ALBUMIN 1.5*  --  1.6*  AST 143*  --  50*  ALT 89*  --  78*  ALKPHOS 341*  --  427*  BILITOT 0.5  --  0.3  PREALBUMIN 6.0*  --   --   TRIG  --  61  --    Estimated Creatinine Clearance: 84.1 ml/min (by C-G formula based on Cr of 0.63).   Recent Labs  08/27/13 1155 08/27/13 1749 08/28/13 0054  GLUCAP 114* 108* 125*   Medical History: Past Medical History  Diagnosis Date  . Hypertension   . Reflux    Medications:  Scheduled:  . albuterol  2.5 mg Nebulization BID  . antiseptic oral rinse  15 mL Mouth Rinse q12n4p  . chlorhexidine  15 mL Mouth Rinse BID  . insulin aspart  0-9 Units Subcutaneous Q6H  . nicotine  14 mg Transdermal Daily  . pantoprazole (PROTONIX) IV  40 mg Intravenous Q12H  . piperacillin-tazobactam  3.375 g Intravenous Q8H  . 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 Biopsies were taken with jumbo forceps at the time of EGD but diagnosis cannot be confirmed.  Since same problem likely to occur with repeat EGD, Plan is for EUS with deeper and directed biopsies.  Patient was [facility] on [facility]/9 for procedure.   GOO.  Recent EGD showed ulcerative esophagitis (secondary to gastric outlet obstruction).  Parenteral nutrition started on 12/5.   Patient does not tolerate Enteral feedings.    Labs    Electrolytes: WNL  Calcium:  corrects to WN  Renal: Remains WNL and stable with Scr 0.63  Liver: LFT's were WNL, trended up to 143/89 (12/8), trending down now 50/78 (12/9),  Alk phos continues to rend up 427 (12/9).  No new labs today.  - Suspected liver abnormalities are secondary to parenteral nutrition since transaminases and alk phos were normal at time of admission.  - Will continue with reduced rate of TPN and monitor.  Glucose: At goal (< 150)    TG: 61 (12/8)  Nutritional Goals:  Re-estimated needs from 12/9 RD note Kcal: 1770-2065  Protein: 88-106 gr  Fluid: 1800 ml daily  Clinimix 5/15 @ 65 ml/hr with 250 ml 20% lipid emulsion, MVI, trace elements daily. Regimen delivers 1587 kcal, 78 gr amino acids, 48 gr fat daily which is 90% minimum est energy, 89% minimum est protein needs daily  Goal TNA rate of 80 ml/hr with 20% fat emulsion at 10 ml/hr will provide 96 grams protein and 1843 kcal/day  Plan:  Increase Clinimix E 5/15 to 70 ml/hr today - f/u LFT's in AM Lipids @ 10 ml/hr (daily with Clinimix)  Lipids, MVI, trace elements daily. TNA labs on Mondays and Thursdays Continue Sensitive SSI  Randy Le, Loma Messing PharmD Pager #: [phone] 11:44 AM 08/28/2013

## 2013-08-28 NOTE — Progress Notes (Signed)
[facility] PROGRESS NOTE  Randy Le ZOX:[id number] DOB: 1956/01/31 DOA: 08/22/2013 PCP: Kirk Ruths, MD  Brief narrative: 57 y.o. male with a past medical history of hypertension, COPD who presented to [facility] ED 08/22/2013 with reports of abdominal pain associated with 25 lbs weight loss over 5 weeks prior to this admission, poor oral intake, generalized weakness. Patient underwent EGD by biopsies were non-confirmatory of carcinoma. He has NG tube in place for gastric outlet obstruction.   Assessment/Plan:   Principal Problem: Abdominal pain secondary to gastric outlet obstruction - EGD done 08/23/2013 with findings of gastric outlet obstruction, linitis plastica or gastric lymphoma. Biopsy was non-confirmatory of carcinoma - plan for EUS for further evaluation - NG tube in place - Continue to keep n.p.o.  Active problems: Cough - Likely related to possible aspiration pneumonia related to gastric outlet obstruction - Patient was started on Zosyn 08/23/2013 which is reasonable to be continued considering high risk of aspiration, would continue this for 2 more days to complete 7 day course treatment - Continue Albuterol nebulizer every 4-6 hours PRN Bilateral Lower Extremity Edema  - Likely related to malnutrition and hypoalbuminemia.  - TNA started Hyponatremia  - Related to dehydration.  - Resolved.   Code Status: full code Family Communication: family at the bedside Disposition Plan: home when stable  Manson Passey, MD  [facility] Pager [phone]  If 7PM-7AM, please contact night-coverage [email or web address] Password [email or web address] 08/28/2013, 11:57 AM   LOS: 6 days   Consultants:  Gastroenterology  Procedures:  EGD 08/23/2013  Antibiotics:  Zosyn 08/23/2013 -->  HPI/Subjective: Still with abdominal pain.  Objective: Filed Vitals:   08/27/13 1952 08/27/13 2252 08/28/13 0500 08/28/13 0733  BP:  114/69 125/76   Pulse:  97 87   Temp:  98.1 F (36.7 C)  98.1 F (36.7 C)   TempSrc:  Oral Oral   Resp:  20 18   Height:      Weight:      SpO2: 95% 100% 100% 100%    Intake/Output Summary (Last 24 hours) at 08/28/13 1157 Last data filed at 08/27/13 2000  Gross per 24 hour  Intake      0 ml  Output   1575 ml  Net  -1575 ml    Exam:   General:  Pt is alert, follows commands appropriately, not in acute distress  Cardiovascular: Regular rate and rhythm, S1/S2, no murmurs, no rubs, no gallops  Respiratory: Clear to auscultation bilaterally, no wheezing  Abdomen: Soft, tender in upper and mid abdomen; NGT in place  Extremities: Resolved LE edema, pulses DP and PT palpable bilaterally  Neuro: Grossly nonfocal  Data Reviewed: Basic Metabolic Panel:  Recent Labs Lab 08/23/13 0544 08/24/13 0455 08/25/13 0617 08/26/13 0503 08/27/13 0513  NA 133* 130* 137 139 137  K 3.9 3.3* 3.4* 3.8 3.5  CL 97 98 103 103 102  CO2 30 28 26 29 31   GLUCOSE 103* 171* 146* 140* 121*  BUN 12 10 7 9 9   CREATININE 0.89 0.77 0.68 0.67 0.63  CALCIUM 8.3* 7.7* 7.8* 8.2* 8.1*  MG  --  2.1  --  1.9  --   PHOS  --  2.1*  --  3.2  --    Liver Function Tests:  Recent Labs Lab 08/22/13 1727 08/23/13 0544 08/24/13 0455 08/26/13 0503 08/27/13 0513  AST 12 14 14  143* 50*  ALT 10 11 10  89* 78*  ALKPHOS 48 47 39 341* 427*  BILITOT 0.3 0.3 0.3 0.5  0.3  PROT 5.5* 5.1* 4.2* 4.5* 4.6*  ALBUMIN 2.4* 2.3* 1.7* 1.5* 1.6*    Recent Labs Lab 08/22/13 1727  LIPASE 61*   No results found for this basename: AMMONIA,  in the last 168 hours CBC:  Recent Labs Lab 08/22/13 1727 08/23/13 0544 08/24/13 0455 08/25/13 0617 08/26/13 0503  WBC 8.1 9.2 6.7 7.6 5.6  NEUTROABS 6.1  --  5.7  --  4.2  HGB 12.5* 13.6 10.9* 10.8* 10.6*  HCT 35.6* 39.2 31.3* 31.2* 30.5*  MCV 95.2 95.4 95.4 95.7 96.5  PLT 358 387 279 256 268   Cardiac Enzymes: No results found for this basename: CKTOTAL, CKMB, CKMBINDEX, TROPONINI,  in the last 168 hours BNP: No components  found with this basename: POCBNP,  CBG:  Recent Labs Lab 08/26/13 2359 08/27/13 0603 08/27/13 1155 08/27/13 1749 08/28/13 0054  GLUCAP 122* 126* 114* 108* 125*    MRSA PCR SCREENING     Status: None   Collection Time    08/23/13  1:30 PM      Result Value Range Status   MRSA by PCR NEGATIVE  NEGATIVE Final     Studies: No results found.  Scheduled Meds: . albuterol  2.5 mg Nebulization BID  . insulin aspart  0-9 Units Subcutaneous Q6H  . nicotine  14 mg Transdermal Daily  . pantoprazole   40 mg Intravenous Q12H  . piperacillin-tazobactam  3.375 g Intravenous Q8H   Continuous Infusions: . sodium chloride 30 mL/hr at 08/27/13 2000  . Marland KitchenTPN (CLINIMIX-E) Adult 65 mL/hr at 08/27/13 1727   And  . fat emulsion 250 mL (08/27/13 1726)  . Marland KitchenTPN (CLINIMIX-E) Adult     And  . fat emulsion

## 2013-08-28 NOTE — Consult Note (Signed)
Patient interviewed and examined,  imaging studies, endoscopy report and pathology all personally reviewed, agree with PA note above. Patient has gastric outlet obstruction with worsening symptomatology at approximately 25 pound weight loss over about the last 6 weeks. 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 Biopsies are inconclusive. The patient has severe protein calorie malnutrition as well as anasarca. He has a history of heavy cigarette use and probable COPD. He has a previous history of alcohol abuse. His liver feels somewhat enlarged and firm and he may have some degree of cirrhosis as well. I discussed the situation extensively with the patient. I agree that he will need gastrectomy. I believe he would benefit however from a period of preoperative nutritional support to at least get his albumin up above 2. Appreciate Dr. Christella Hartigan and Dr. Lamar Sprinkles input. I agree that tissue diagnosis would not change the need for surgery but it may alter the surgical approach if this were adenocarcinoma versus lymphoma in terms of degree of resection and lymph node dissection.I will discuss this with them further.continue nutritional support and gastric decompression for now.  Mariella Saa MD, FACS  08/28/2013 5:25 PM

## 2013-08-29 ENCOUNTER — Encounter (HOSPITAL_COMMUNITY)
Admission: EM | Disposition: A | Discharge status: Home Health | Payer: Self-pay | Source: Home / Self Care | Attending: Internal Medicine

## 2013-08-29 ENCOUNTER — Encounter (HOSPITAL_COMMUNITY): Payer: Self-pay | Admitting: Gastroenterology

## 2013-08-29 ENCOUNTER — Inpatient Hospital Stay (HOSPITAL_COMMUNITY): Payer: Federal, State, Local not specified - PPO | Admitting: Anesthesiology

## 2013-08-29 ENCOUNTER — Encounter (HOSPITAL_COMMUNITY): Payer: Federal, State, Local not specified - PPO | Admitting: Anesthesiology

## 2013-08-29 DIAGNOSIS — K311 Adult hypertrophic pyloric stenosis: Secondary | ICD-10-CM

## 2013-08-29 DIAGNOSIS — K5289 Other specified noninfective gastroenteritis and colitis: Secondary | ICD-10-CM

## 2013-08-29 HISTORY — PX: EUS: SHX5427

## 2013-08-29 LAB — COMPREHENSIVE METABOLIC PANEL
ALT: 149 U/L — ABNORMAL HIGH (ref 0–53)
AST: 95 U/L — ABNORMAL HIGH (ref 0–37)
Alkaline Phosphatase: 536 U/L — ABNORMAL HIGH (ref 39–117)
CO2: 30 mEq/L (ref 19–32)
GFR calc Af Amer: >90 mL/min (ref >90)
GFR calc non Af Amer: >90 mL/min (ref >90)
Glucose, Bld: 127 mg/dL — ABNORMAL HIGH (ref 70–99)
Potassium: 3.6 mEq/L (ref 3.5–5.1)
Sodium: 135 mEq/L (ref 135–145)
Total Protein: 4.8 g/dL — ABNORMAL LOW (ref 6.0–8.3)

## 2013-08-29 LAB — GLUCOSE, CAPILLARY
Glucose-Capillary: 117 mg/dL — ABNORMAL HIGH (ref 70–99)
Glucose-Capillary: 123 mg/dL — ABNORMAL HIGH (ref 70–99)
Glucose-Capillary: 123 mg/dL — ABNORMAL HIGH (ref 70–99)

## 2013-08-29 SURGERY — ESOPHAGEAL ENDOSCOPIC ULTRASOUND (EUS) RADIAL
Anesthesia: General

## 2013-08-29 MED ORDER — LIDOCAINE HCL (PF) 2 % IJ SOLN
INTRAMUSCULAR | Status: DC | PRN
  Administered 2013-08-29: 75 mg via INTRADERMAL

## 2013-08-29 MED ORDER — FENTANYL CITRATE 0.05 MG/ML IJ SOLN
INTRAMUSCULAR | Status: AC
  Filled 2013-08-29: qty 5

## 2013-08-29 MED ORDER — IPRATROPIUM BROMIDE 0.02 % IN SOLN: 0.5000 mg | Freq: Four times a day (QID) | RESPIRATORY_TRACT | Status: DC | PRN

## 2013-08-29 MED ORDER — TRACE MINERALS CR-CU-F-FE-I-MN-MO-SE-ZN IV SOLN
INTRAVENOUS | Status: AC
  Administered 2013-08-29: 19:00:00 via INTRAVENOUS
  Filled 2013-08-29: qty 2000

## 2013-08-29 MED ORDER — ALBUTEROL SULFATE (5 MG/ML) 0.5% IN NEBU: 2.5000 mg | INHALATION_SOLUTION | Freq: Four times a day (QID) | RESPIRATORY_TRACT | Status: DC | PRN

## 2013-08-29 MED ORDER — FAT EMULSION 20 % IV EMUL
250.0000 mL | INTRAVENOUS | Status: AC
  Administered 2013-08-29: 250 mL via INTRAVENOUS
  Filled 2013-08-29: qty 250

## 2013-08-29 MED ORDER — ONDANSETRON HCL 4 MG/2ML IJ SOLN
INTRAMUSCULAR | Status: DC | PRN
  Administered 2013-08-29: 4 mg via INTRAVENOUS

## 2013-08-29 MED ORDER — FENTANYL CITRATE 0.05 MG/ML IJ SOLN
INTRAMUSCULAR | Status: DC | PRN
  Administered 2013-08-29 (×3): 50 ug via INTRAVENOUS
  Administered 2013-08-29: 100 ug via INTRAVENOUS

## 2013-08-29 MED ORDER — PHENYLEPHRINE 40 MCG/ML (10ML) SYRINGE FOR IV PUSH (FOR BLOOD PRESSURE SUPPORT)
PREFILLED_SYRINGE | INTRAVENOUS | Status: AC
  Filled 2013-08-29: qty 10

## 2013-08-29 MED ORDER — SUCCINYLCHOLINE CHLORIDE 20 MG/ML IJ SOLN
INTRAMUSCULAR | Status: DC | PRN
  Administered 2013-08-29: 100 mg via INTRAVENOUS

## 2013-08-29 MED ORDER — SUCCINYLCHOLINE CHLORIDE 20 MG/ML IJ SOLN
INTRAMUSCULAR | Status: AC
  Filled 2013-08-29: qty 1

## 2013-08-29 MED ORDER — SODIUM CHLORIDE 0.9 % IV SOLN: INTRAVENOUS | Status: DC

## 2013-08-29 MED ORDER — PROPOFOL 10 MG/ML IV BOLUS
INTRAVENOUS | Status: AC
  Filled 2013-08-29: qty 20

## 2013-08-29 MED ORDER — ONDANSETRON HCL 4 MG/2ML IJ SOLN
INTRAMUSCULAR | Status: AC
  Filled 2013-08-29: qty 2

## 2013-08-29 MED ORDER — PHENYLEPHRINE HCL 10 MG/ML IJ SOLN
INTRAMUSCULAR | Status: DC | PRN
  Administered 2013-08-29 (×2): 80 ug via INTRAVENOUS

## 2013-08-29 MED ORDER — LIDOCAINE HCL (CARDIAC) 20 MG/ML IV SOLN
INTRAVENOUS | Status: AC
  Filled 2013-08-29: qty 5

## 2013-08-29 MED ORDER — PROPOFOL 10 MG/ML IV BOLUS
INTRAVENOUS | Status: DC | PRN
  Administered 2013-08-29: 100 mg via INTRAVENOUS

## 2013-08-29 MED ORDER — MIDAZOLAM HCL 5 MG/5ML IJ SOLN
INTRAMUSCULAR | Status: DC | PRN
  Administered 2013-08-29: 2 mg via INTRAVENOUS

## 2013-08-29 MED ORDER — MIDAZOLAM HCL 2 MG/2ML IJ SOLN
INTRAMUSCULAR | Status: AC
  Filled 2013-08-29: qty 2

## 2013-08-29 MED ORDER — LACTATED RINGERS IV SOLN
INTRAVENOUS | Status: DC | PRN
  Administered 2013-08-29: 12:00:00 via INTRAVENOUS

## 2013-08-29 NOTE — H&P (View-Only) (Signed)
Referring Provider: No ref. provider found Primary Care Physician:  MCGOUGH,WILLIAM M, MD Primary Gastroenterologist:  Unassigned; GI in [place]  Reason for Consultation:  GOO; need for EUS and deeper biopsies  HPI: Randy Le is a 57 y.o. male with a past medical history of hypertension and COPD who presented to [facility] 08/22/2013 with reports of abdominal pain associated with 25 lbs weight loss over 5 weeks prior to this admission, poor oral intake, generalized weakness.  Underwent EGD.   EGD findings as follows:  Impression:  Ulcerative esophagitis (secondary to gastric outlet obstruction).  Large stomach with food debris. Abnormal antral mucosa with nodularity and friability and pyloric channel stenosis.  Multiple biopsies taken.  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   During the procedure he was refluxing gastric contents into his hypopharynx.  Concern for aspiration.  Being treated with Zosyn.  Biopsies showed the following:  Stomach, biopsy, lesion at gastric antrum - POLYPOID FRAGMENTS OF GASTRIC BODY AND ANTRAL TYPE MUCOSA WITH MARKED ATYPICAL CELLS.  Prior to admission he had a CT scan on 11/25, which showed the following:  IMPRESSION:  1. Prominent gastric antral wall thickening may be due to tumor or  inflammation. Stomach body is distended as is the esophagus.  Patulous gastroesophageal junction with reflux.  2. Diffuse 3rd spacing of fluid in the subcutaneous and mesenteric  adipose tissue.  3. Cachexia  4. Airway thickening in the lower lobes, favoring bronchitis.  5. A 4 mm nodule in the left upper lobe. If the patient is at high  risk for bronchogenic carcinoma, follow-up chest CT at 1 year is  recommended. If the patient is at low risk, no follow-up is needed.  This recommendation follows the consensus statement: Guidelines for Management of Small Pulmonary Nodules Detected on CT Scans: A Statement from the Fleischner  Society as published in Radiology 2005; 237:395-400.  6. Stable adrenal adenomas.  7. Moderate ascites.  8. Prominent prostate gland  9. Lower lumbar facet arthropathy.  At some point NGT was placed at some point and he was placed on TNA.  GOO was not resolving well and surgery in [place] did not feel comfortable with the case.  Also, GI in [place] felt that he may need EUS for deeper biopsies.  He was transferred to [facility] the evening of 12/9.  [facility] is evaluating him as well, but think that he will benefit from EUS before surgery.  Discussion to take place amongst Dr. Tupac Jeffus, Dr. Jacobs, and Dr. Hoxworth.  LFT's have been elevated for the past two days and were completely normal on admission.  Total bili is 0.3, ALP 427, AST 50, and ALT 78 yesterday.  Thought to be cholestatic, possibly due to TNA.    Past Medical History  Diagnosis Date  . Hypertension   . Reflux     Past Surgical History  Procedure Laterality Date  . Hernia repair    . Laparotomy  04/08/2011    Procedure: EXPLORATORY LAPAROTOMY;  Surgeon: Mark A Jenkins;  Location: [facility]S;  Service: General;  Laterality: N/A;  Exploratory Laparotomy with Partial Small Bowel Resection  . Small intestiine surgery    . External ear surgery    . Esophagogastroduodenoscopy N/A 08/23/2013    Procedure: ESOPHAGOGASTRODUODENOSCOPY (EGD);  Surgeon: Najeeb U Rehman, MD;  Location: [facility];  Service: Endoscopy;  Laterality: N/A;    Prior to Admission medications   Medication Sig Start Date End Date Taking? Authorizing Provider  metoprolol (TOPROL-XL)   100 MG 24 hr tablet Take 100 mg by mouth every morning.    Yes Historical Provider, MD  Multiple Vitamin (MULTIVITAMIN) capsule Take 1 capsule by mouth daily.     Yes Historical Provider, MD  NEXIUM 40 MG capsule Take 40 mg by mouth daily. 08/14/13  Yes Historical Provider, MD    Current Facility-Administered Medications  Medication Dose Route Frequency Provider Last Rate Last Dose  .  0.9 %  sodium chloride infusion   Intravenous Continuous Estela Y Hernandez Acosta, MD 30 mL/hr at 08/27/13 2000    . acetaminophen (TYLENOL) tablet 650 mg  650 mg Oral Q6H PRN Gokul Krishnan, MD       Or  . acetaminophen (TYLENOL) suppository 650 mg  650 mg Rectal Q6H PRN Gokul Krishnan, MD      . albuterol (PROVENTIL) (5 MG/ML) 0.5% nebulizer solution 2.5 mg  2.5 mg Nebulization Q2H PRN Gokul Krishnan, MD   2.5 mg at 08/23/13 1035  . albuterol (PROVENTIL) (5 MG/ML) 0.5% nebulizer solution 2.5 mg  2.5 mg Nebulization BID Estela Y Hernandez Acosta, MD   2.5 mg at 08/28/13 0730  . alum & mag hydroxide-simeth (MAALOX/MYLANTA) 200-200-20 MG/5ML suspension 30 mL  30 mL Oral Q6H PRN Gokul Krishnan, MD   30 mL at 08/22/13 2344  . antiseptic oral rinse (BIOTENE) solution 15 mL  15 mL Mouth Rinse q12n4p Shanker M Ghimire, MD   15 mL at 08/28/13 1200  . chlorhexidine (PERIDEX) 0.12 % solution 15 mL  15 mL Mouth Rinse BID Shanker M Ghimire, MD   15 mL at 08/28/13 0800  . TPN (CLINIMIX-E) Adult   Intravenous Continuous TPN Andrea Michelle Lilliston, RPH 65 mL/hr at 08/27/13 1727     And  . fat emulsion 20 % infusion 250 mL  250 mL Intravenous Continuous TPN Andrea Michelle Lilliston, RPH 10 mL/hr at 08/27/13 1726 250 mL at 08/27/13 1726  . TPN (CLINIMIX-E) Adult   Intravenous Continuous TPN Mary Patricia Borgerding, RPH       And  . fat emulsion 20 % infusion 250 mL  250 mL Intravenous Continuous TPN Mary Patricia Borgerding, RPH      . insulin aspart (novoLOG) injection 0-9 Units  0-9 Units Subcutaneous Q6H Najeeb U Rehman, MD   1 Units at 08/28/13 1235  . morphine 2 MG/ML injection 2 mg  2 mg Intravenous Q3H PRN Karen J Kirby-Graham, NP   2 mg at 08/28/13 1227  . nicotine (NICODERM CQ - dosed in mg/24 hours) patch 14 mg  14 mg Transdermal Daily Gokul Krishnan, MD   14 mg at 08/28/13 1025  . ondansetron (ZOFRAN) tablet 4 mg  4 mg Oral Q6H PRN Gokul Krishnan, MD       Or  . ondansetron (ZOFRAN) injection 4  mg  4 mg Intravenous Q6H PRN Gokul Krishnan, MD   4 mg at 08/24/13 0747  . pantoprazole (PROTONIX) injection 40 mg  40 mg Intravenous Q12H Najeeb U Rehman, MD   40 mg at 08/28/13 1025  . phenol (CHLORASEPTIC) mouth spray 1 spray  1 spray Mouth/Throat PRN Mary A Lynch, NP   1 spray at 08/23/13 2249  . piperacillin-tazobactam (ZOSYN) IVPB 3.375 g  3.375 g Intravenous Q8H Najeeb U Rehman, MD   3.375 g at 08/28/13 0830  . sodium chloride 0.9 % injection 10-40 mL  10-40 mL Intracatheter Q12H Estela Y Hernandez Acosta, MD   20 mL at 08/28/13 1026  . sodium chloride 0.9 % injection 10-40 mL    10-40 mL Intracatheter PRN Estela Y Hernandez Acosta, MD   30 mL at 08/23/13 1545    Allergies as of 08/22/2013  . (No Known Allergies)    History reviewed. No pertinent family history.  History   Social History  . Marital Status: Married    Spouse Name: N/A    Number of Children: N/A  . Years of Education: N/A   Occupational History  . Not on file.   Social History Main Topics  . Smoking status: Current Every Day Smoker -- 1.00 packs/day    Types: Cigarettes  . Smokeless tobacco: Not on file  . Alcohol Use: 0.0 oz/week    3-4 Cans of beer per week     Comment: daily  . Drug Use: No  . Sexual Activity: Not on file   Other Topics Concern  . Not on file   Social History Narrative  . No narrative on file    Review of Systems: Ten point ROS is O/W negative except as mentioned in HPI.  Physical Exam: Vital signs in last 24 hours: Temp:  [97.5 F (36.4 C)-98.5 F (36.9 C)] 97.5 F (36.4 C) (12/10 1400) Pulse Rate:  [87-97] 89 (12/10 1400) Resp:  [18-20] 20 (12/10 1400) BP: (105-135)/(64-77) 105/64 mmHg (12/10 1400) SpO2:  [95 %-100 %] 100 % (12/10 1400) Last BM Date: 08/22/13 General:   Alert,  Well-developed, well-nourished, pleasant and cooperative in NAD Head:  Normocephalic and atraumatic. Eyes:  Sclera clear, no icterus.  Conjunctiva pink. Ears:  Normal auditory acuity. Mouth:   No deformity or lesions.   Lungs:  Clear throughout to auscultation.  No wheezes, crackles, or rhonchi.  Heart:  Regular rate and rhythm; no murmurs, clicks, rubs,  or gallops. Abdomen:  Soft,nontender, BS active,nonpalp mass or hsm.   Rectal:  Deferred  Msk:  Symmetrical without gross deformities. Pulses:  Normal pulses noted. Extremities:  Without clubbing or edema. Neurologic:  Alert and  oriented x4;  grossly normal neurologically. Skin:  Intact without significant lesions or rashes. Psych:  Alert and cooperative. Normal mood and affect.  Intake/Output from previous day: 12/09 0701 - 12/10 0700 In: -  Out: 1900 [Urine:650; Emesis/NG output:1250] Intake/Output this shift: Total I/O In: -  Out: 250 [Urine:250]  Lab Results:  Recent Labs  08/26/13 0503  WBC 5.6  HGB 10.6*  HCT 30.5*  PLT 268   BMET  Recent Labs  08/26/13 0503 08/27/13 0513  NA 139 137  K 3.8 3.5  CL 103 102  CO2 29 31  GLUCOSE 140* 121*  BUN 9 9  CREATININE 0.67 0.63  CALCIUM 8.2* 8.1*   LFT  Recent Labs  08/27/13 0513  PROT 4.6*  ALBUMIN 1.6*  AST 50*  ALT 78*  ALKPHOS 427*  BILITOT 0.3   IMPRESSION:  #1. 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 Biopsies were taken with jumbo forceps at the time of EGD but diagnosis cannot be confirmed.  #2. Abnormal LFTs with cholestatic pattern. Suspect this abnormality is secondary to parenteral nutrition. Transaminases and alkaline phosphatase were normal at the time of admission. Doubt that he is developing CBD obstruction. Bile duct can be possibly evaluated at the time of EUS if thought necessary and if performed.  #3. Malnutrition. Patient has very low albumin and prealbumin and he has been on parenteral nutrition since 08/23/2013.     PLAN: -Will tentatively schedule for EUS for 12/11, but finally decision pending discussion amongst Dr. Charleston Hankin, Dr. Jacobs,   and Dr. Hoxworth.   ZEHR,  JESSICA D.  08/28/2013, 3:07 PM  Pager number [phone]  GI ATTENDING  History, laboratories, x-rays, recent endoscopy report review. Patient personally seen and examined. Agree with H&P as outlined above. Patient presents with persistent gastric outlet obstruction and significantly abnormal CT scan and EGD as outlined. This is most certainly malignant process involving the gastric antrum. Ascites worrisome. 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 At this point, he needs surgery. Timing per general surgery. I reviewed the case with Dr. Jacobs. It does not seem that endoscopic ultrasound would alter management. He needs surgery for diagnostic and therapeutic purposes. In the interim, continue with NG decompression and nutritional support. Discussed with patient.  Kashena Novitski N. Iniko Robles, Jr., M.D. [facility] of Gastroenterology    

## 2013-08-29 NOTE — Progress Notes (Signed)
PARENTERAL NUTRITION CONSULT NOTE  Pharmacy Consult for TPN Indication: ulcerative esophagitis(secondary to GOO).  No Known Allergies  Patient Measurements: Height: 5\' 8"  (172.7 cm) Weight: 130 lb 3.2 oz (59.058 kg) IBW/kg (Calculated) : 68.4  Vital Signs: Temp: 98.1 F (36.7 C) (12/11 0522) Temp src: Oral (12/11 0522) BP: 131/68 mmHg (12/11 0522) Pulse Rate: 87 (12/11 0522) Intake/Output from previous day: 12/10 0701 - 12/11 0700 In: 280 [I.V.:180; IV Piggyback:100] Out: 1750 [Urine:850; Emesis/NG output:900] Intake/Output from this shift:    Labs: No results found for this basename: WBC, HGB, HCT, PLT, APTT, INR,  in the last 72 hours   Recent Labs  08/27/13 0513 08/29/13 0625  NA 137 135  K 3.5 3.6  CL 102 100  CO2 31 30  GLUCOSE 121* 127*  BUN 9 14  CREATININE 0.63 0.65  CALCIUM 8.1* 8.4  PHOS  --  3.5  PROT 4.6* 4.8*  ALBUMIN 1.6* 1.6*  AST 50* 95*  ALT 78* 149*  ALKPHOS 427* 536*  BILITOT 0.3 1.0  PREALBUMIN  --  9.7*   Estimated Creatinine Clearance: 84.1 ml/min (by C-G formula based on Cr of 0.65).   Recent Labs  08/28/13 1716 08/29/13 0004 08/29/13 0552  GLUCAP 100* 123* 140*   Medical History: Past Medical History  Diagnosis Date  . Hypertension   . Reflux    Medications:  Scheduled:  . albuterol  2.5 mg Nebulization BID  . antiseptic oral rinse  15 mL Mouth Rinse q12n4p  . chlorhexidine  15 mL Mouth Rinse BID  . insulin aspart  0-9 Units Subcutaneous Q6H  . nicotine  14 mg Transdermal Daily  . pantoprazole (PROTONIX) IV  40 mg Intravenous Q12H  . piperacillin-tazobactam  3.375 g Intravenous Q8H  . 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 Biopsies were taken with jumbo  forceps at the time of EGD but diagnosis cannot be confirmed.  Since same problem likely to occur with repeat EGD, Plan is for EUS with deeper and directed biopsies.  Patient was transferred to [facility] on 12/9 for procedure.   GOO.  Recent EGD showed ulcerative esophagitis (secondary to gastric outlet obstruction).  Parenteral nutrition started on 12/5.   Patient does not tolerate Enteral feedings.    Labs    Electrolytes: WNL  Calcium:  corrects to WN  Renal: Remains WNL and stable with Scr 0.65  Liver: LFT's were WNL prior to initiation of TNA.  Usually LFT's are affected by TNA after longer term/chronic TNA use, not common to see liver function decline so quickly with the start of TNA.  Patient also has a history of EtOH use, question if cirrhosis is contributing to elevated LFT's.  Patient is on Clinimix E 5/15 to reduce the dextrose infusion rate, today will change patient to cyclic TNA so it infuses over an 18 hour period in order to minimize hepatic complications.   Will repeat CMP in AM.   Glucose: At goal (< 150)    TG: 61 (12/8)   Nutritional Goals:  Re-estimated needs from 12/9 RD note Kcal: 1770-2065  Protein: 88-106 gr  Fluid: 1800 ml daily  Goal TNA rate of 80 ml/hr with 20% fat emulsion at 10 ml/hr will provide 96 grams protein  and 1843 kcal/day  Plan:   Start Cyclic TNA over 18 hours - 39ml/hr x 1 hour, then 98 ml/hr x 16 hours, then 8ml/hr x 1 hour - TNA will start at 1800 and is to be discontinued at 1200 the next day.    20% Fat Emulsion to infuse over 18 hours started at 1800 at 13 ml/hr   TNA labs on Mondays and Thursdays  Continue Sensitive SSI  Continue MIVF at 30 ml/hr  Continue to monitor liver function, CMP in morning   Xavious Sharrar, Loma Messing PharmD Pager #: [phone] 11:07 AM 08/29/2013

## 2013-08-29 NOTE — Progress Notes (Signed)
Agree with above  Dr. Christella Hartigan proceeded with repeat endoscopy today and multiple biopsies were taken. 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 Was unable to traverse the pylorus to place a distal feeding tube. Continue TNA

## 2013-08-29 NOTE — Progress Notes (Signed)
[facility] PROGRESS NOTE  Randy Le RUE:[id number] DOB: 1956-08-01 DOA: 08/22/2013 PCP: Kirk Ruths, MD  Brief narrative: 57 y.o. male with a past medical history of hypertension, COPD who presented to [facility] 08/22/2013 with reports of abdominal pain associated with 25 lbs weight loss over 5 weeks prior to this admission, poor oral intake, generalized weakness. Patient underwent EGD by biopsies were non-confirmatory of carcinoma. He has NG tube in place for gastric outlet obstruction.   Assessment/Plan:   Principal Problem:  Abdominal pain secondary to gastric outlet obstruction  - EGD done 08/23/2013 with findings of gastric outlet obstruction, linitis plastica or gastric lymphoma. Biopsy was non-confirmatory of carcinoma  - appreciate GI and surgery following; at this time pt will likely need gastrectomy but his nutritional status needs to improve  - NG tube in place  - Continue to keep n.p.o.  Active problems:  Cough  - Likely related to possible aspiration pneumonia  - Patient was started on Zosyn 08/23/2013 which is reasonable to be continued considering high risk of aspiration, would continue this for 1 more day provided he does not have any fever and WBC count is WNL - Continue Albuterol nebulizer every 4-6 hours PRN  Bilateral Lower Extremity Edema  - Likely related to malnutrition and hypoalbuminemia.  - TNA started  Hyponatremia  - Related to dehydration.  - Resolved.   Code Status: full code  Family Communication: family not at the bedside today Disposition Plan: home when stable   Consultants:  Gastroenterology Surgery Procedures:  EGD 08/23/2013 Antibiotics:  Zosyn 08/23/2013 -->   Manson Passey, MD  [facility] Pager [phone]  If 7PM-7AM, please contact night-coverage [email or web address] Password [facility] 08/29/2013, 6:36 AM   LOS: 7 days    HPI/Subjective: No acute overnight events. Still complains of productive cough.  Objective: Filed  Vitals:   08/28/13 1400 08/28/13 1938 08/28/13 2020 08/29/13 0522  BP: 105/64  121/65 131/68  Pulse: 89  101 87  Temp: 97.5 F (36.4 C)  97.9 F (36.6 C) 98.1 F (36.7 C)  TempSrc: Oral  Oral Oral  Resp: 20  20 20   Height:      Weight:      SpO2: 100% 98% 98% 100%    Intake/Output Summary (Last 24 hours) at 08/29/13 0636 Last data filed at 08/29/13 0523  Gross per 24 hour  Intake    310 ml  Output   1750 ml  Net  -1440 ml   Exam:  General: Pt is alert, follows commands appropriately, not in acute distress  Cardiovascular: Regular rate and rhythm, S1/S2, no murmurs, no rubs, no gallops  Respiratory: rhodochrous, now is receiving breathing treatment  Abdomen: Soft, tender in upper and mid abdomen; NGT in place  Extremities: Resolved LE edema, pulses DP and PT palpable bilaterally  Neuro: Grossly nonfocal   Data Reviewed: Basic Metabolic Panel:  Recent Labs Lab 08/23/13 0544 08/24/13 0455 08/25/13 0617 08/26/13 0503 08/27/13 0513  NA 133* 130* 137 139 137  K 3.9 3.3* 3.4* 3.8 3.5  CL 97 98 103 103 102  CO2 30 28 26 29 31   GLUCOSE 103* 171* 146* 140* 121*  BUN 12 10 7 9 9   CREATININE 0.89 0.77 0.68 0.67 0.63  CALCIUM 8.3* 7.7* 7.8* 8.2* 8.1*  MG  --  2.1  --  1.9  --   PHOS  --  2.1*  --  3.2  --    Liver Function Tests:  Recent Labs Lab 08/22/13 1727 08/23/13  1610 08/24/13 0455 08/26/13 0503 08/27/13 0513  AST 12 14 14  143* 50*  ALT 10 11 10  89* 78*  ALKPHOS 48 47 39 341* 427*  BILITOT 0.3 0.3 0.3 0.5 0.3  PROT 5.5* 5.1* 4.2* 4.5* 4.6*  ALBUMIN 2.4* 2.3* 1.7* 1.5* 1.6*    Recent Labs Lab 08/22/13 1727  LIPASE 61*   No results found for this basename: AMMONIA,  in the last 168 hours CBC:  Recent Labs Lab 08/22/13 1727 08/23/13 0544 08/24/13 0455 08/25/13 0617 08/26/13 0503  WBC 8.1 9.2 6.7 7.6 5.6  NEUTROABS 6.1  --  5.7  --  4.2  HGB 12.5* 13.6 10.9* 10.8* 10.6*  HCT 35.6* 39.2 31.3* 31.2* 30.5*  MCV 95.2 95.4 95.4 95.7 96.5  PLT  358 387 279 256 268   Cardiac Enzymes: No results found for this basename: CKTOTAL, CKMB, CKMBINDEX, TROPONINI,  in the last 168 hours BNP: No components found with this basename: POCBNP,  CBG:  Recent Labs Lab 08/28/13 0054 08/28/13 1226 08/28/13 1716 08/29/13 0004 08/29/13 0552  GLUCAP 125* 126* 100* 123* 140*    MRSA PCR SCREENING     Status: None   Collection Time    08/23/13  1:30 PM      Result Value Range Status   MRSA by PCR NEGATIVE  NEGATIVE Final     Studies: No results found.  Scheduled Meds: . albuterol  2.5 mg Nebulization BID  . antiseptic oral rinse  15 mL Mouth Rinse q12n4p  . chlorhexidine  15 mL Mouth Rinse BID  . insulin aspart  0-9 Units Subcutaneous Q6H  . nicotine  14 mg Transdermal Daily  . pantoprazole (PROTONIX) IV  40 mg Intravenous Q12H  . piperacillin-tazobactam  3.375 g Intravenous Q8H  . sodium chloride  10-40 mL Intracatheter Q12H   Continuous Infusions: . sodium chloride 30 mL/hr at 08/28/13 0700  . Marland KitchenTPN (CLINIMIX-E) Adult 70 mL/hr at 08/28/13 1807   And  . fat emulsion 250 mL (08/28/13 1807)

## 2013-08-29 NOTE — Transfer of Care (Signed)
Immediate Anesthesia Transfer of Care Note  Patient: Randy Le  Procedure(s) Performed: Procedure(s) with comments: ESOPHAGEAL ENDOSCOPIC ULTRASOUND (EUS) RADIAL (N/A) - plus/minus linear  Patient Location: PACU and Endoscopy Unit  Anesthesia Type:General  Level of Consciousness: awake, sedated and responds to stimulation  Airway & Oxygen Therapy: Patient Spontanous Breathing and Patient connected to face mask oxygen  Post-op Assessment: Report given to PACU RN and Post -op Vital signs reviewed and stable  Post vital signs: Reviewed and stable  Complications: No apparent anesthesia complications

## 2013-08-29 NOTE — Interval H&P Note (Signed)
History and Physical Interval Note:  08/29/2013 11:39 AM  Randy Le  has presented today for surgery, with the diagnosis of GOO; malignancy; needs biopsies  The various methods of treatment have been discussed with the patient and family. After consideration of risks, benefits and other options for treatment, the patient has consented to  Procedure(s) with comments: ESOPHAGEAL ENDOSCOPIC ULTRASOUND (EUS) RADIAL (N/A) - plus/minus linear as a surgical intervention .  The patient's history has been reviewed, patient examined, no change in status, stable for surgery.  I have reviewed the patient's chart and labs.  Questions were answered to the patient's satisfaction.     I discussed the case with Dr. Johna Sheriff today.  He feels that surgical approach can differ substantially depending on upfront knowledge of the etiology of this GOO (especially in terms of lymphoma vs. Adenocarcnioma).  Will plan EUS today for diagnosis. Also I am going to attempt to pull DH tube distal to the osbtruction, if successful this will be a much superior way to provide him nutritional support prior to what is going to be a major surgery.  I discussed with patient and his wife and they agree   Rachael Fee

## 2013-08-29 NOTE — Op Note (Signed)
[facility] [address] [place], [address]   ENDOSCOPIC ULTRASOUND PROCEDURE REPORT  PATIENT: Randy Le, Randy Le  MR#: [id number] BIRTHDATE: Sep 28, 1955  GENDER: Male ENDOSCOPIST: Rachael Fee, MD REFERRED BY:  Lionel December, M.D. PROCEDURE DATE:  08/29/2013 PROCEDURE:   Upper EUS w/FNA , EGD with biopsy, EGD wtih attempted long tube placement, post pyloric ASA CLASS:      Class III INDICATIONS:   Gastric outlet obstruction from likely distal stomach malignancy; previous EGD biopsies from Dr.  Karilyn Cota showed numerous atypical (glandular) cells but where not conclusively malignant. MEDICATIONS: General endotracheal anesthesia (GETA)  DESCRIPTION OF PROCEDURE:   After the risks benefits and alternatives of the procedure were  explained, informed consent was obtained. The patient was then placed in the left, lateral, decubitus postion and IV sedation was administered. Throughout the procedure, the patients blood pressure, pulse and oxygen saturations were monitored continuously.  Under direct visualization, the PENTAX EUS SCOPE and EUS scope  endoscope was introduced through the mouth  and advanced to the pylorus .  Water was used as necessary to provide an acoustic interface.  Upon completion of the imaging, water was removed and the patient was sent to the recovery room in satisfactory condition.  Endoscopic findings: 1. Severe ulcerative distal esophagitis 2. Small amount of solid food in stomach, proximally. 3. The distal gastric mucosa was significantly abnromal, circumferentially.  This was biopsied extensively.  The mucosa was neoplastic, edematous, friable and I was unable to advance into the duodenum with either gastroscope or pediatric colonoscopic.  I made several attempts to pull/push a thin nasoenteric feeding tube into his duodenum without success.  EUS findings: 1. The gastric wall distall was significantly thickened, mass like (9mm thick  gastric wall).  The thickest region was sampled with linear echoendoscope, 22 gauge EUS FNA needle. 2. There was trace to moderate ascites noted around the liver, not sampled.  Impression: Infiltrating maligancy in distal stomach creating gastric outlet obstruction, severe resultant esophagitis.  The distal stomach mucosa was extensively, deeply biopsied with jumbo forceps again. The thickened distal gastric wall was sampled with EUS guided FNA. I attempted to also place a naso-enteric tube post pylorus without success. At end of procedure, NG tube for decompression was replaced.  Await final FNA and mucosal biopsies.  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  He will need resection, timing per general surgery.  Continue nutrition via parenteral means.   _______________________________ eSignedRachael Fee, MD 08/29/2013 2:22 PM   PATIENT NAME:  Randy Le, Randy Le MR#: [id number]

## 2013-08-29 NOTE — Anesthesia Preprocedure Evaluation (Signed)
Anesthesia Evaluation  Patient identified by MRN, date of birth, ID band Patient awake    Reviewed: Allergy & Precautions, H&P , NPO status , Patient's Chart, lab work & pertinent test results  Airway Mallampati: II TM Distance: >3 FB Neck ROM: Full    Dental no notable dental hx. (+) Edentulous Upper and Edentulous Lower   Pulmonary neg pulmonary ROS, COPDCurrent Smoker,  breath sounds clear to auscultation  Pulmonary exam normal       Cardiovascular hypertension, Pt. on medications negative cardio ROS  Rhythm:Regular Rate:Normal     Neuro/Psych negative neurological ROS  negative psych ROS   GI/Hepatic negative GI ROS, Neg liver ROS, GERD-  Poorly Controlled,Elevated LFTs    Endo/Other  negative endocrine ROS  Renal/GU negative Renal ROS  negative genitourinary   Musculoskeletal negative musculoskeletal ROS (+)   Abdominal   Peds negative pediatric ROS (+)  Hematology negative hematology ROS (+)   Anesthesia Other Findings   Reproductive/Obstetrics negative OB ROS                           Anesthesia Physical Anesthesia Plan  ASA: III  Anesthesia Plan: General   Post-op Pain Management:    Induction: Intravenous, Rapid sequence and Cricoid pressure planned  Airway Management Planned: Oral ETT  Additional Equipment:   Intra-op Plan:   Post-operative Plan: Extubation in OR  Informed Consent: I have reviewed the patients History and Physical, chart, labs and discussed the procedure including the risks, benefits and alternatives for the proposed anesthesia with the patient or authorized representative who has indicated his/her understanding and acceptance.   Dental advisory given  Plan Discussed with: CRNA  Anesthesia Plan Comments: (High risk for aspiratiion. Will do GA with RSI)        Anesthesia Quick Evaluation

## 2013-08-29 NOTE — Progress Notes (Signed)
6 Days Post-Op  Subjective: Had some pain medicine so he's sleeping well when I came in.  OK with NG decompression, no nausea.  Objective: Vital signs in last 24 hours: Temp:  [97.5 F (36.4 C)-98.1 F (36.7 C)] 98.1 F (36.7 C) (12/11 0522) Pulse Rate:  [87-101] 87 (12/11 0522) Resp:  [20] 20 (12/11 0522) BP: (105-131)/(64-68) 131/68 mmHg (12/11 0522) SpO2:  [98 %-100 %] 100 % (12/11 0522) Last BM Date: 08/22/13 900 from NG yesterday Afebrile, VSS LFT's continue upward trend. Intake/Output from previous day: 12/10 0701 - 12/11 0700 In: 280 [I.V.:180; IV Piggyback:100] Out: 1750 [Urine:850; Emesis/NG output:900] Intake/Output this shift:    General appearance: alert, cooperative and no distress GI: soft, enlarged liver, tender Right abdomen.  Few BS. no distension  Lab Results:  No results found for this basename: WBC, HGB, HCT, PLT,  in the last 72 hours  BMET  Recent Labs  08/27/13 0513 08/29/13 0625  NA 137 135  K 3.5 3.6  CL 102 100  CO2 31 30  GLUCOSE 121* 127*  BUN 9 14  CREATININE 0.63 0.65  CALCIUM 8.1* 8.4   PT/INR No results found for this basename: LABPROT, INR,  in the last 72 hours   Recent Labs Lab 08/23/13 0544 08/24/13 0455 08/26/13 0503 08/27/13 0513 08/29/13 0625  AST 14 14 143* 50* 95*  ALT 11 10 89* 78* 149*  ALKPHOS 47 39 341* 427* 536*  BILITOT 0.3 0.3 0.5 0.3 1.0  PROT 5.1* 4.2* 4.5* 4.6* 4.8*  ALBUMIN 2.3* 1.7* 1.5* 1.6* 1.6*     Lipase     Component Value Date/Time   LIPASE 61* 08/22/2013 1727     Studies/Results: No results found.  Medications: . albuterol  2.5 mg Nebulization BID  . antiseptic oral rinse  15 mL Mouth Rinse q12n4p  . chlorhexidine  15 mL Mouth Rinse BID  . insulin aspart  0-9 Units Subcutaneous Q6H  . nicotine  14 mg Transdermal Daily  . pantoprazole (PROTONIX) IV  40 mg Intravenous Q12H  . piperacillin-tazobactam  3.375 g Intravenous Q8H  . sodium chloride  10-40 mL Intracatheter Q12H     Assessment/Plan 1. 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  2. Hypertension  3. COPD/TOBACCO USE  4. Malnutrition (prealbumin 6.0 08/25/13) Currently on TNA  5. Hx of ETOH use,  6. New LFT elevations on TNA/hx of ETOH use in the past. Possible cirrhosis? 7. Lower leg swelling, now improving with TNA. 8. Possible aspiration   Plan: Ongoing nutritional support with TNA, he will need medical clearance also, and will defer to Dr. Elisabeth Pigeon. He is not on chemical DVT prophylaxis.  I have added SCD's for now. He can start lovenox or heparin from our standpoint.    LOS: 7 days    Randy Le 08/29/2013

## 2013-08-29 NOTE — Preoperative (Signed)
Beta Blockers   Reason not to administer Beta Blockers:Patient on metoprolol at home.  Not on medication in hospital at present.  Will not give for this procedure secondary to hypotension.

## 2013-08-29 NOTE — Anesthesia Postprocedure Evaluation (Signed)
  Anesthesia Post-op Note  Patient: Randy Le  Procedure(s) Performed: Procedure(s) (LRB): ESOPHAGEAL ENDOSCOPIC ULTRASOUND (EUS) RADIAL (N/A)  Patient Location: PACU  Anesthesia Type: MAC  Level of Consciousness: awake and alert   Airway and Oxygen Therapy: Patient Spontanous Breathing  Post-op Pain: mild  Post-op Assessment: Post-op Vital signs reviewed, Patient's Cardiovascular Status Stable, Respiratory Function Stable, Patent Airway and No signs of Nausea or vomiting  Last Vitals:  Filed Vitals:   08/29/13 1530  BP: 148/82  Pulse:   Temp:   Resp: 20    Post-op Vital Signs: stable   Complications: No apparent anesthesia complications

## 2013-08-30 ENCOUNTER — Telehealth: Payer: Self-pay | Admitting: Gastroenterology

## 2013-08-30 ENCOUNTER — Encounter (HOSPITAL_COMMUNITY): Payer: Self-pay | Admitting: Gastroenterology

## 2013-08-30 DIAGNOSIS — E46 Unspecified protein-calorie malnutrition: Secondary | ICD-10-CM

## 2013-08-30 LAB — GLUCOSE, CAPILLARY
Glucose-Capillary: 125 mg/dL — ABNORMAL HIGH (ref 70–99)
Glucose-Capillary: 101 mg/dL — ABNORMAL HIGH (ref 70–99)
Glucose-Capillary: 108 mg/dL — ABNORMAL HIGH (ref 70–99)
Glucose-Capillary: 64 mg/dL — ABNORMAL LOW (ref 70–99)

## 2013-08-30 LAB — COMPREHENSIVE METABOLIC PANEL
ALT: 119 U/L — ABNORMAL HIGH (ref 0–53)
AST: 49 U/L — ABNORMAL HIGH (ref 0–37)
Albumin: 1.5 g/dL — ABNORMAL LOW (ref 3.5–5.2)
Alkaline Phosphatase: 511 U/L — ABNORMAL HIGH (ref 39–117)
BUN: 17 mg/dL (ref 6–23)
Calcium: 8.4 mg/dL (ref 8.4–10.5)
Chloride: 102 mEq/L (ref 96–112)
Creatinine, Ser: 0.66 mg/dL (ref 0.50–1.35)
GFR calc Af Amer: >90 mL/min (ref >90)
GFR calc non Af Amer: >90 mL/min (ref >90)
Potassium: 3.6 mEq/L (ref 3.5–5.1)
Sodium: 137 mEq/L (ref 135–145)
Total Bilirubin: 0.6 mg/dL (ref 0.3–1.2)
Total Protein: 4.6 g/dL — ABNORMAL LOW (ref 6.0–8.3)

## 2013-08-30 LAB — CEA: CEA: 5.7 ng/mL — ABNORMAL HIGH (ref 0.0–5.0)

## 2013-08-30 MED ORDER — FAT EMULSION 20 % IV EMUL
250.0000 mL | INTRAVENOUS | Status: AC
  Administered 2013-08-30: 250 mL via INTRAVENOUS
  Filled 2013-08-30 (×2): qty 250

## 2013-08-30 MED ORDER — MORPHINE SULFATE 2 MG/ML IJ SOLN
1.0000 mg | Freq: Once | INTRAMUSCULAR | Status: AC
  Administered 2013-08-30: 1 mg via INTRAVENOUS
  Filled 2013-08-30: qty 1

## 2013-08-30 MED ORDER — TRACE MINERALS CR-CU-F-FE-I-MN-MO-SE-ZN IV SOLN
INTRAVENOUS | Status: AC
  Administered 2013-08-30: 18:00:00 via INTRAVENOUS
  Filled 2013-08-30: qty 2000

## 2013-08-30 MED ORDER — DEXTROSE 50 % IV SOLN
25.0000 mL | Freq: Once | INTRAVENOUS | Status: AC | PRN
  Administered 2013-08-30: 25 mL via INTRAVENOUS
  Filled 2013-08-30: qty 50

## 2013-08-30 NOTE — Progress Notes (Signed)
Hypoglycemic Event  CBG: 64   Treatment: D50 IV 25 mL  Symptoms: None  Follow-up CBG: Time:1710 CBG Result:108  Possible Reasons for Event: Other: Patient TNA has been stopped until 1800  Comments/MD notified:MD notified that patient was given 1/2 amp of D50 and follow up result was 108    Cathlean Cower, Richardson Landry  Remember to initiate Hypoglycemia Order Set & complete

## 2013-08-30 NOTE — Progress Notes (Signed)
CRITICAL VALUE ALERT  Critical value received:  Blood sugar 64  Date of notification:  08/30/2013  Time of notification:  1645  Critical value read back:yes  Nurse who received alert:  Charlynne Pander, RN  MD notified (1st page):  Elisabeth Pigeon  Time of first page:  1646  MD notified (2nd page):  Time of second page:  Responding MD:  Elisabeth Pigeon  Time MD responded:  1725 notified Dr Elisabeth Pigeon that patient's follow up blood sugar was 107

## 2013-08-30 NOTE — Telephone Encounter (Signed)
Preliminary report from Dr. Dierdre Searles from pathology.  Neoplastic gastric process is not consistent with lymphoma.  Awaiting further stains for final path/cytology report.

## 2013-08-30 NOTE — Progress Notes (Signed)
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)  No Known Allergies  Patient Measurements: Height: 5\' 8"  (172.7 cm) Weight: 130 lb 3.2 oz (59.058 kg) IBW/kg (Calculated) : 68.4  Vital Signs: Temp: 97.7 F (36.5 C) (12/12 0553) Temp src: Oral (12/12 0553) BP: 122/69 mmHg (12/12 0553) Pulse Rate: 88 (12/12 0553) Intake/Output from previous day: 12/11 0701 - 12/12 0700 In: 7953 [I.V.:2054.5; IV Piggyback:250; TPN:5648.5] Out: 1900 [Urine:450; Emesis/NG output:1450] Intake/Output from this shift: Total I/O In: 0  Out: 200 [Urine:200]  Labs: No results found for this basename: WBC, HGB, HCT, PLT, APTT, INR,  in the last 72 hours   Recent Labs  08/29/13 0625 08/30/13 0535  NA 135 137  K 3.6 3.6  CL 100 102  CO2 30 31  GLUCOSE 127* 123*  BUN 14 17  CREATININE 0.65 0.66  CALCIUM 8.4 8.4  PHOS 3.5  --   PROT 4.8* 4.6*  ALBUMIN 1.6* 1.5*  AST 95* 49*  ALT 149* 119*  ALKPHOS 536* 511*  BILITOT 1.0 0.6  PREALBUMIN 9.7*  --    Estimated Creatinine Clearance: 84.1 ml/min (by C-G formula based on Cr of 0.66).   Recent Labs  08/30/13 0551 08/30/13 0828 08/30/13 1214  GLUCAP 125* 107* 101*   Medications:  Scheduled:  . antiseptic oral rinse  15 mL Mouth Rinse q12n4p  . chlorhexidine  15 mL Mouth Rinse BID  . insulin aspart  0-9 Units Subcutaneous Q6H  . nicotine  14 mg Transdermal Daily  . pantoprazole (PROTONIX) IV  40 mg Intravenous Q12H  . 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 Biopsies obtained during EGD and EUS with FNA.  EGD showed ulcerative esophagitis/gastritis (secondary to gastric outlet  obstruction).  Parenteral nutrition started on 12/5.   Unable to place post-pyloric tube for Enteral feedings.    Labs    Electrolytes: WNL  Calcium:  corrects to WN (10.0)  Renal: Remains WNL and stable with Scr 0.65  Liver: LFT's were WNL prior to initiation of TNA.  Usually LFT's are affected by TNA after longer term/chronic TNA use, not common to see liver function decline so quickly with the start of TNA.  Patient also has a history of EtOH use, question if cirrhosis is contributing to elevated LFT's.  Patient is on Clinimix E 5/15 to reduce the dextrose load, and changed to cyclic TNA so it infuses over an 18 hour period in order to minimize hepatic complications.    LFT's slightly improved today over previous values    Glucose: At goal (< 150)    TG: 61 (12/8) Pre-albumin 9.7 (12/11)  Nutritional Goals:  Re-estimated needs from 12/9 RD note Kcal: 1770-2065  Protein: 88-106 gr  Fluid: 1800 ml daily Goal TNA rate of 80 ml/hr with 20% fat emulsion at 10 ml/hr will provide 96 grams protein and 1843 kcal/day  Plan:   Cyclic TNA over 18 hours - 71ml/hr x 1 hour, then 98 ml/hr x 16 hours, then 56ml/hr x 1 hour - TNA will start at 1800 and is to be discontinued at 1200 the next day.    20% Fat Emulsion to infuse over 18 hours started at  1800 at 13 ml/hr   TNA labs on Mondays and Thursdays  Continue Sensitive SSI q6hr  Continue MIVF at 30 ml/hr  Continue to monitor liver function,lytes.   Otho Bellows PharmD Pager [phone] 08/30/2013, 12:33 PM

## 2013-08-30 NOTE — Progress Notes (Signed)
Patient ID: Randy Le, male   DOB: [birth year], 57 y.o.   MRN: [id number] 1 Day Post-Op  Subjective: Pt feels ok.  Some abdominal discomfort, but not severe.  Objective: Vital signs in last 24 hours: Temp:  [97.3 F (36.3 C)-98.1 F (36.7 C)] 97.7 F (36.5 C) (12/12 0553) Pulse Rate:  [83-94] 88 (12/12 0553) Resp:  [14-28] 18 (12/12 0553) BP: (108-155)/(47-87) 122/69 mmHg (12/12 0553) SpO2:  [100 %] 100 % (12/12 0553) Last BM Date: 08/22/13  Intake/Output from previous day: 12/11 0701 - 12/12 0700 In: 7953 [I.V.:2054.5; IV Piggyback:250; TPN:5648.5] Out: 1900 [Urine:450; Emesis/NG output:1450] Intake/Output this shift:    PE: Abd: soft, mild tenderness, +BS, NGT with bilious output,  Heart: regular  Lab Results:  No results found for this basename: WBC, HGB, HCT, PLT,  in the last 72 hours BMET  Recent Labs  08/29/13 0625 08/30/13 0535  NA 135 137  K 3.6 3.6  CL 100 102  CO2 30 31  GLUCOSE 127* 123*  BUN 14 17  CREATININE 0.65 0.66  CALCIUM 8.4 8.4   PT/INR No results found for this basename: LABPROT, INR,  in the last 72 hours CMP     Component Value Date/Time   NA 137 08/30/2013 0535   K 3.6 08/30/2013 0535   CL 102 08/30/2013 0535   CO2 31 08/30/2013 0535   GLUCOSE 123* 08/30/2013 0535   BUN 17 08/30/2013 0535   CREATININE 0.66 08/30/2013 0535   CALCIUM 8.4 08/30/2013 0535   PROT 4.6* 08/30/2013 0535   ALBUMIN 1.5* 08/30/2013 0535   AST 49* 08/30/2013 0535   ALT 119* 08/30/2013 0535   ALKPHOS 511* 08/30/2013 0535   BILITOT 0.6 08/30/2013 0535   GFRNONAA >90 08/30/2013 0535   GFRAA >90 08/30/2013 0535   Lipase     Component Value Date/Time   LIPASE 61* 08/22/2013 1727       Studies/Results: No results found.  Anti-infectives: Anti-infectives   Start     Dose/Rate Route Frequency Ordered Stop   08/23/13 1400  piperacillin-tazobactam (ZOSYN) IVPB 3.375 g     3.375 g 12.5 mL/hr over 240 Minutes Intravenous 3 times per day 08/23/13  1247         Assessment/Plan  1. 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 Gastric outlet obstruction 08/28/2013  . Ascites 08/28/2013  . Nonspecific (abnormal) findings on radiological and other examination of gastrointestinal tract 08/28/2013  . Protein-calorie malnutrition, severe 08/23/2013  . Abdominal pain 08/22/2013  . GERD (gastroesophageal reflux disease) 08/22/2013  . Weight loss, unintentional 08/22/2013  . Gastric wall thickening 08/22/2013  . Hyponatremia 08/22/2013  . Dehydration 08/22/2013  . Tobacco abuse 08/22/2013  . COPD (chronic obstructive pulmonary disease) 08/22/2013  . Pedal edema 08/22/2013  . Enteritis 04/05/2011   Plan: 1. Cont TNA and try to improve PCM for surgical intervention hopefully mid to late next week. 2. Await EUS and endoscopic biopsies, suspect malignancy 3. Cont NGT.  Unable to get a postpyloric tube past for feeding 4. Cont to follow.   LOS: 8 days    Shaneya Taketa E 08/30/2013, 7:58 AM Pager: [phone]

## 2013-08-30 NOTE — Progress Notes (Signed)
[facility] PROGRESS NOTE  Randy Le ZOX:[id number] DOB: [birth year] DOA: 08/22/2013 PCP: Kirk Ruths, MD  Brief narrative: 57 y.o. male with a past medical history of hypertension, COPD who presented to [facility] 08/22/2013 with reports of abdominal pain associated with 25 lbs weight loss over 5 weeks prior to this admission, poor oral intake, generalized weakness. Patient underwent EGD by biopsies were non-confirmatory of carcinoma. He has NG tube in place for gastric outlet obstruction.   Assessment/Plan:   Principal Problem:  Abdominal pain secondary to gastric outlet obstruction  - EGD done 08/23/2013 with findings of gastric outlet obstruction, linitis plastica or gastric lymphoma. Biopsy was non-confirmatory of carcinoma  - had EUS 08/29/2013, awaiting biopsy results - appreciate GI and surgery following - Continue to keep n.p.o.; NGT in place  Active problems:  Cough  - Likely related to possible aspiration pneumonia  - Patient was started on Zosyn 08/23/2013. Will d/c it today. - Continue Albuterol nebulizer every 4-6 hours PRN  Bilateral Lower Extremity Edema  - Likely related to malnutrition and hypoalbuminemia.  - TNA cyclic  Hyponatremia  - Related to dehydration.  - Resolved.   Code Status: full code  Family Communication: family not at the bedside today  Disposition Plan: home when stable   Consultants:  Gastroenterology  Surgery Procedures:  EGD 08/23/2013 EUS 08/29/2013 Antibiotics:  Zosyn 08/23/2013 --> 08/30/2013   Manson Passey, MD  [facility] Pager [phone]  If 7PM-7AM, please contact night-coverage [email or web address] Password [facility] 08/30/2013, 10:37 AM   LOS: 8 days    HPI/Subjective: No acute overnight events.  Objective: Filed Vitals:   08/29/13 1530 08/29/13 1545 08/29/13 2030 08/30/13 0553  BP: 148/82 135/70 120/63 122/69  Pulse:  83 83 88  Temp:  97.3 F (36.3 C) 97.9 F (36.6 C) 97.7 F (36.5 C)  TempSrc:  Oral  Oral Oral  Resp: 20 18 18 18   Height:      Weight:      SpO2: 100% 100% 100% 100%    Intake/Output Summary (Last 24 hours) at 08/30/13 1037 Last data filed at 08/30/13 0609  Gross per 24 hour  Intake 7952.95 ml  Output   1900 ml  Net 6052.95 ml    Exam:   General:  Pt is alert, follows commands appropriately, not in acute distress  Cardiovascular: Regular rate and rhythm, S1/S2 appreciated  Respiratory: Clear to auscultation bilaterally, no wheezing, no crackles, no rhonchi  Abdomen: tender across mid abdomen, NGT in place  Extremities: No edema, pulses DP and PT palpable bilaterally  Neuro: Grossly nonfocal  Data Reviewed: Basic Metabolic Panel:  Recent Labs Lab 08/24/13 0455 08/25/13 0617 08/26/13 0503 08/27/13 0513 08/29/13 0625 08/30/13 0535  NA 130* 137 139 137 135 137  K 3.3* 3.4* 3.8 3.5 3.6 3.6  CL 98 103 103 102 100 102  CO2 28 26 29 31 30 31   GLUCOSE 171* 146* 140* 121* 127* 123*  BUN 10 7 9 9 14 17   CREATININE 0.77 0.68 0.67 0.63 0.65 0.66  CALCIUM 7.7* 7.8* 8.2* 8.1* 8.4 8.4  MG 2.1  --  1.9  --   --   --   PHOS 2.1*  --  3.2  --  3.5  --    Liver Function Tests:  Recent Labs Lab 08/24/13 0455 08/26/13 0503 08/27/13 0513 08/29/13 0625 08/30/13 0535  AST 14 143* 50* 95* 49*  ALT 10 89* 78* 149* 119*  ALKPHOS 39 341* 427* 536* 511*  BILITOT 0.3  0.5 0.3 1.0 0.6  PROT 4.2* 4.5* 4.6* 4.8* 4.6*  ALBUMIN 1.7* 1.5* 1.6* 1.6* 1.5*   No results found for this basename: LIPASE, AMYLASE,  in the last 168 hours No results found for this basename: AMMONIA,  in the last 168 hours CBC:  Recent Labs Lab 08/24/13 0455 08/25/13 0617 08/26/13 0503  WBC 6.7 7.6 5.6  NEUTROABS 5.7  --  4.2  HGB 10.9* 10.8* 10.6*  HCT 31.3* 31.2* 30.5*  MCV 95.4 95.7 96.5  PLT 279 256 268   Cardiac Enzymes: No results found for this basename: CKTOTAL, CKMB, CKMBINDEX, TROPONINI,  in the last 168 hours BNP: No components found with this basename: POCBNP,   CBG:  Recent Labs Lab 08/29/13 1203 08/29/13 1812 08/30/13 0007 08/30/13 0551 08/30/13 0828  GLUCAP 123* 117* 117* 125* 107*    Recent Results (from the past 240 hour(s))  MRSA PCR SCREENING     Status: None   Collection Time    08/23/13  1:30 PM      Result Value Range Status   MRSA by PCR NEGATIVE  NEGATIVE Final   Comment:            The GeneXpert MRSA Assay (FDA     approved for NASAL specimens     only), is one component of a     comprehensive MRSA colonization     surveillance program. It is not     intended to diagnose MRSA     infection nor to guide or     monitor treatment for     MRSA infections.     Studies: No results found.  Scheduled Meds: . antiseptic oral rinse  15 mL Mouth Rinse q12n4p  . chlorhexidine  15 mL Mouth Rinse BID  . insulin aspart  0-9 Units Subcutaneous Q6H  . nicotine  14 mg Transdermal Daily  . pantoprazole (PROTONIX) IV  40 mg Intravenous Q12H  . piperacillin-tazobactam  3.375 g Intravenous Q8H  . sodium chloride  10-40 mL Intracatheter Q12H   Continuous Infusions: . sodium chloride 30 mL/hr at 08/28/13 0700  . Marland KitchenTPN (CLINIMIX-E) Adult 50 mL/hr at 08/29/13 1800   And  . fat emulsion 250 mL (08/29/13 1800)

## 2013-08-30 NOTE — Progress Notes (Signed)
Patient interviewed and examined, agree with PA note above. I discussed the endoscopic findings and treatment plan with the patient and answered all his questions.  Mariella Saa MD, FACS  08/30/2013 6:02 PM

## 2013-08-31 LAB — GLUCOSE, CAPILLARY
Glucose-Capillary: 102 mg/dL — ABNORMAL HIGH (ref 70–99)
Glucose-Capillary: 119 mg/dL — ABNORMAL HIGH (ref 70–99)
Glucose-Capillary: 126 mg/dL — ABNORMAL HIGH (ref 70–99)
Glucose-Capillary: 130 mg/dL — ABNORMAL HIGH (ref 70–99)
Glucose-Capillary: 86 mg/dL (ref 70–99)

## 2013-08-31 MED ORDER — DEXTROSE-NACL 5-0.9 % IV SOLN: INTRAVENOUS | Status: DC

## 2013-08-31 MED ORDER — TRACE MINERALS CR-CU-F-FE-I-MN-MO-SE-ZN IV SOLN
INTRAVENOUS | Status: AC
  Administered 2013-08-31: 18:00:00 via INTRAVENOUS
  Filled 2013-08-31: qty 2000

## 2013-08-31 MED ORDER — DEXTROSE-NACL 5-0.9 % IV SOLN
INTRAVENOUS | Status: AC
  Administered 2013-08-31: 09:00:00 via INTRAVENOUS

## 2013-08-31 MED ORDER — MORPHINE SULFATE 4 MG/ML IJ SOLN
3.0000 mg | INTRAMUSCULAR | Status: DC | PRN
  Administered 2013-08-31 – 2013-09-07 (×54): 3 mg via INTRAVENOUS
  Filled 2013-08-31 (×57): qty 1

## 2013-08-31 MED ORDER — DEXTROSE 50 % IV SOLN
25.0000 mL | Freq: Once | INTRAVENOUS | Status: AC | PRN
  Administered 2013-08-31: 25 mL via INTRAVENOUS

## 2013-08-31 MED ORDER — FAT EMULSION 20 % IV EMUL
250.0000 mL | INTRAVENOUS | Status: AC
  Administered 2013-08-31: 250 mL via INTRAVENOUS
  Filled 2013-08-31 (×2): qty 250

## 2013-08-31 NOTE — Progress Notes (Addendum)
[facility] PROGRESS NOTE  Randy Le NFA:[id number] DOB: 05-22-1956 DOA: 08/22/2013 PCP: Kirk Ruths, MD  Brief narrative: 57 y.o. male with a past medical history of hypertension, COPD who presented to [facility] 08/22/2013 with reports of abdominal pain associated with 25 lbs weight loss over 5 weeks prior to this admission, poor oral intake, generalized weakness. Patient underwent EGD by biopsies were non-confirmatory of carcinoma. Subsequent evaluation included EUS and we are awaiting biopsy results at this time. PT has NG tube in place for gastric outlet obstruction.   Assessment/Plan:   Principal Problem:  Abdominal pain secondary to gastric outlet obstruction  - EGD done 08/23/2013 with findings of gastric outlet obstruction, linitis plastica or gastric lymphoma. Biopsy was non-confirmatory of carcinoma  - had EUS 08/29/2013, awaiting biopsy results  - appreciate GI and surgery following  - Continue to keep n.p.o.; NGT in place  - pain management with morphine 2 mg every 3 hours PRN IV which we will increase to 3 mg every 2 hours PRN IV for severe pain.  Active problems:  Cough  - Likely related to possible aspiration pneumonia  - Patient was started on Zosyn 08/23/2013. Zosyn is now discontinued after 7 days of treatment  - Continue Albuterol nebulizer every 4-6 hours PRN  Bilateral Lower Extremity Edema  - Likely related to malnutrition and hypoalbuminemia.  - TNA cyclic  Hyponatremia  - Related to dehydration.  - Resolved.   Code Status: full code  Family Communication: family at the bedside today  Disposition Plan: home when stable   Consultants:  Gastroenterology  Surgery Procedures:  EGD 08/23/2013  EUS 08/29/2013 Antibiotics:  Zosyn 08/23/2013 --> 08/30/2013   Manson Passey, MD  [facility] Pager [phone]  If 7PM-7AM, please contact night-coverage [email or web address] Password [facility] 08/31/2013, 6:49 AM   LOS: 9 days    HPI/Subjective: No  overnight events. Pt reporeted having restless night because of abdominal pain.   Objective: Filed Vitals:   08/30/13 0553 08/30/13 1424 08/30/13 2130 08/31/13 0615  BP: 122/69 122/75 121/65 94/79  Pulse: 88 89 80 92  Temp: 97.7 F (36.5 C) 98.4 F (36.9 C) 98.5 F (36.9 C) 98 F (36.7 C)  TempSrc: Oral Oral Oral Oral  Resp: 18 16 18 18   Height:      Weight:      SpO2: 100% 100% 100% 99%    Intake/Output Summary (Last 24 hours) at 08/31/13 0649 Last data filed at 08/30/13 2300  Gross per 24 hour  Intake 1489.55 ml  Output   1150 ml  Net 339.55 ml   Exam:  General: Pt is alert, follows commands appropriately, not in acute distress  Cardiovascular: Regular rate and rhythm, S1/S2 appreciated  Respiratory: Clear to auscultation bilaterally, no wheezing, no crackles, no rhonchi  Abdomen: tender across mid abdomen, NGT in place  Extremities: No edema, pulses DP and PT palpable bilaterally  Neuro: Grossly nonfocal   Data Reviewed: Basic Metabolic Panel:  Recent Labs Lab 08/25/13 0617 08/26/13 0503 08/27/13 0513 08/29/13 0625 08/30/13 0535  NA 137 139 137 135 137  K 3.4* 3.8 3.5 3.6 3.6  CL 103 103 102 100 102  CO2 26 29 31 30 31   GLUCOSE 146* 140* 121* 127* 123*  BUN 7 9 9 14 17   CREATININE 0.68 0.67 0.63 0.65 0.66  CALCIUM 7.8* 8.2* 8.1* 8.4 8.4  MG  --  1.9  --   --   --   PHOS  --  3.2  --  3.5  --    Liver Function Tests:  Recent Labs Lab 08/26/13 0503 08/27/13 0513 08/29/13 0625 08/30/13 0535  AST 143* 50* 95* 49*  ALT 89* 78* 149* 119*  ALKPHOS 341* 427* 536* 511*  BILITOT 0.5 0.3 1.0 0.6  PROT 4.5* 4.6* 4.8* 4.6*  ALBUMIN 1.5* 1.6* 1.6* 1.5*   No results found for this basename: LIPASE, AMYLASE,  in the last 168 hours No results found for this basename: AMMONIA,  in the last 168 hours CBC:  Recent Labs Lab 08/25/13 0617 08/26/13 0503  WBC 7.6 5.6  NEUTROABS  --  4.2  HGB 10.8* 10.6*  HCT 31.2* 30.5*  MCV 95.7 96.5  PLT 256 268    Cardiac Enzymes: No results found for this basename: CKTOTAL, CKMB, CKMBINDEX, TROPONINI,  in the last 168 hours BNP: No components found with this basename: POCBNP,  CBG:  Recent Labs Lab 08/30/13 1214 08/30/13 1643 08/30/13 1714 08/31/13 0026 08/31/13 0609  GLUCAP 101* 64* 108* 119* 109*    MRSA PCR SCREENING     Status: None   Collection Time    08/23/13  1:30 PM      Result Value Range Status   MRSA by PCR NEGATIVE  NEGATIVE Final     Studies: No results found.  Scheduled Meds: . antiseptic oral rinse  15 mL Mouth Rinse q12n4p  . chlorhexidine  15 mL Mouth Rinse BID  . insulin aspart  0-9 Units Subcutaneous Q6H  . nicotine  14 mg Transdermal Daily  . pantoprazole (PROTONIX) IV  40 mg Intravenous Q12H  . sodium chloride  10-40 mL Intracatheter Q12H   Continuous Infusions: . sodium chloride 30 mL/hr at 08/31/13 0258  . Marland KitchenTPN (CLINIMIX-E) Adult     And  . fat emulsion 250 mL (08/30/13 1735)

## 2013-08-31 NOTE — Progress Notes (Addendum)
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)  No Known Allergies  Patient Measurements: Height: 5\' 8"  (172.7 cm) Weight: 130 lb 3.2 oz (59.058 kg) IBW/kg (Calculated) : 68.4  Vital Signs: Temp: 98 F (36.7 C) (12/13 0615) Temp src: Oral (12/13 0615) BP: 94/79 mmHg (12/13 0615) Pulse Rate: 92 (12/13 0615) Intake/Output from previous day: 12/12 0701 - 12/13 0700 In: 2194.8 [I.V.:708; TPN:1486.8] Out: 1350 [Urine:500; Emesis/NG output:850] Intake/Output from this shift:    Labs: No results found for this basename: WBC, HGB, HCT, PLT, APTT, INR,  in the last 72 hours   Recent Labs  08/29/13 0625 08/30/13 0535  NA 135 137  K 3.6 3.6  CL 100 102  CO2 30 31  GLUCOSE 127* 123*  BUN 14 17  CREATININE 0.65 0.66  CALCIUM 8.4 8.4  PHOS 3.5  --   PROT 4.8* 4.6*  ALBUMIN 1.6* 1.5*  AST 95* 49*  ALT 149* 119*  ALKPHOS 536* 511*  BILITOT 1.0 0.6  PREALBUMIN 9.7*  --    Estimated Creatinine Clearance: 84.1 ml/min (by C-G formula based on Cr of 0.66).   Recent Labs  08/31/13 0026 08/31/13 0609 08/31/13 0729  GLUCAP 119* 109* 126*   Medications:  Scheduled:  . antiseptic oral rinse  15 mL Mouth Rinse q12n4p  . chlorhexidine  15 mL Mouth Rinse BID  . insulin aspart  0-9 Units Subcutaneous Q6H  . nicotine  14 mg Transdermal Daily  . pantoprazole (PROTONIX) IV  40 mg Intravenous Q12H  . 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 Biopsies obtained during EGD and EUS with FNA.  EGD showed ulcerative esophagitis/gastritis (secondary to gastric outlet obstruction).  Parenteral nutrition started on 12/5.    Unable to place post-pyloric tube for Enteral feedings.    Labs:    Electrolytes: WNL(12/12)  Calcium:  corrects to WNL (10.0)  Renal: Remains WNL and stable with Scr 0.65(12/12)  LFT's: slightly improved on 12/12.  Glucose: Hypoglycemic event yesterday at 16:43 while TNA was off. CBGs during TNA infusion: 209-126. Goal (< 150)    TG: 61(12/8) Pre-albumin 9.7(12/11)  Nutritional Goals:  Re-estimated needs from 12/9 RD note: Kcal: 1770-2065  Protein: 88-106g Fluid: daily Clinimix E5/15 1655ml/day + 20% fat emulsion 246ml/day will provide 84 grams protein and 1672 kcal/day.  Plan:   Cont Cyclic TNA over 18 hours - 62ml/hr x 1 hour, then 98 ml/hr x 16 hours, then 86ml/hr x 1 hour - TNA will start at 18:00 and is to be discontinued at 12:00 the next day.    Cont 20% Fat Emulsion to infuse over 18 hours started at 18:00 at 13 ml/hr.  TNA to contain standard MVI and trace elements daily.   TNA labs on Mondays and Thursdays  Continue Sensitive SSI q6hr  Change IVF to D5NS at 13ml/hr.  Check Cmet, Mag, and Phos 12/14.  Charolotte Eke, PharmD, pager [phone]. 08/31/2013,8:33 AM.

## 2013-08-31 NOTE — Progress Notes (Signed)
2 Days Post-Op  Subjective: No complaints  Objective: Vital signs in last 24 hours: Temp:  [98 F (36.7 C)-98.5 F (36.9 C)] 98 F (36.7 C) (12/13 0615) Pulse Rate:  [80-92] 92 (12/13 0615) Resp:  [16-18] 18 (12/13 0615) BP: (94-122)/(65-79) 94/79 mmHg (12/13 0615) SpO2:  [99 %-100 %] 99 % (12/13 0615) Last BM Date: 08/30/13  Intake/Output from previous day: 12/12 0701 - 12/13 0700 In: 2194.8 [I.V.:708; TPN:1486.8] Out: 1350 [Urine:500; Emesis/NG output:850] Intake/Output this shift:    PE: General- In NAD Abdomen-soft, flat, nontender  Lab Results:  No results found for this basename: WBC, HGB, HCT, PLT,  in the last 72 hours BMET  Recent Labs  08/29/13 0625 08/30/13 0535  NA 135 137  K 3.6 3.6  CL 100 102  CO2 30 31  GLUCOSE 127* 123*  BUN 14 17  CREATININE 0.65 0.66  CALCIUM 8.4 8.4   PT/INR No results found for this basename: LABPROT, INR,  in the last 72 hours Comprehensive Metabolic Panel:    Component Value Date/Time   NA 137 08/30/2013 0535   K 3.6 08/30/2013 0535   CL 102 08/30/2013 0535   CO2 31 08/30/2013 0535   BUN 17 08/30/2013 0535   CREATININE 0.66 08/30/2013 0535   GLUCOSE 123* 08/30/2013 0535   CALCIUM 8.4 08/30/2013 0535   AST 49* 08/30/2013 0535   ALT 119* 08/30/2013 0535   ALKPHOS 511* 08/30/2013 0535   BILITOT 0.6 08/30/2013 0535   PROT 4.6* 08/30/2013 0535   ALBUMIN 1.5* 08/30/2013 0535     Studies/Results: No results found.  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 Severe PC malnutrition-prealbumin 9.7, albumin 1.6   LOS: 9 days   Plan: Continue TPN, await path results.   Chevon Laufer J 08/31/2013

## 2013-08-31 NOTE — Progress Notes (Signed)
Hypoglycemic Event  CBG: 69  Treatment: D50 IV 25 mL  Symptoms: Vision changes and None  Follow-up CBG: Time:1625 CBG Result:100  Possible Reasons for Event: Medication regimen: Cyclic TNA stopped at 1200  Comments/MD notified:MD notified    Gloriajean Dell  Remember to initiate Hypoglycemia Order Set & complete

## 2013-09-01 LAB — COMPREHENSIVE METABOLIC PANEL
ALT: 80 U/L — ABNORMAL HIGH (ref 0–53)
AST: 45 U/L — ABNORMAL HIGH (ref 0–37)
Alkaline Phosphatase: 621 U/L — ABNORMAL HIGH (ref 39–117)
CO2: 31 mEq/L (ref 19–32)
Calcium: 8.5 mg/dL (ref 8.4–10.5)
Creatinine, Ser: 0.58 mg/dL (ref 0.50–1.35)
GFR calc Af Amer: >90 mL/min (ref >90)
GFR calc non Af Amer: >90 mL/min (ref >90)
Glucose, Bld: 123 mg/dL — ABNORMAL HIGH (ref 70–99)
Potassium: 3.6 mEq/L (ref 3.5–5.1)
Sodium: 139 mEq/L (ref 135–145)

## 2013-09-01 LAB — GLUCOSE, CAPILLARY
Glucose-Capillary: 136 mg/dL — ABNORMAL HIGH (ref 70–99)
Glucose-Capillary: 110 mg/dL — ABNORMAL HIGH (ref 70–99)
Glucose-Capillary: 78 mg/dL (ref 70–99)

## 2013-09-01 MED ORDER — FAT EMULSION 20 % IV EMUL
250.0000 mL | INTRAVENOUS | Status: AC
  Administered 2013-09-01: 250 mL via INTRAVENOUS
  Filled 2013-09-01: qty 250

## 2013-09-01 MED ORDER — DEXTROSE 10 % IV SOLN
INTRAVENOUS | Status: DC
  Administered 2013-09-01: 12:00:00 via INTRAVENOUS

## 2013-09-01 MED ORDER — TRACE MINERALS CR-CU-F-FE-I-MN-MO-SE-ZN IV SOLN
INTRAVENOUS | Status: AC
  Administered 2013-09-01: 18:00:00 via INTRAVENOUS
  Filled 2013-09-01: qty 2000

## 2013-09-01 MED ORDER — SODIUM CHLORIDE 0.9 % IV SOLN
INTRAVENOUS | Status: AC
  Administered 2013-09-01 – 2013-09-02 (×2): via INTRAVENOUS

## 2013-09-01 MED ORDER — MENTHOL 3 MG MT LOZG
1.0000 | LOZENGE | OROMUCOSAL | Status: DC | PRN
  Administered 2013-09-01 – 2013-09-12 (×3): 3 mg via ORAL
  Filled 2013-09-01 (×12): qty 9

## 2013-09-01 NOTE — Progress Notes (Addendum)
[facility] PROGRESS NOTE  Randy Le XBM:[id number] DOB: 02-13-1956 DOA: 08/22/2013 PCP: Kirk Ruths, MD  Brief narrative: 57 y.o. male with a past medical history of hypertension, COPD who presented to [facility] 08/22/2013 with reports of abdominal pain associated with 25 lbs weight loss over 5 weeks prior to this admission, poor oral intake, generalized weakness. Patient underwent EGD by biopsies were non-confirmatory of carcinoma. Subsequent evaluation included EUS and we are awaiting biopsy results at this time. PT has NG tube in place for gastric outlet obstruction.   Assessment/Plan:   Principal Problem:  Abdominal pain secondary to gastric outlet obstruction  - EGD done 08/23/2013 with findings of gastric outlet obstruction, linitis plastica or gastric lymphoma. Biopsy was non-confirmatory of carcinoma  - had EUS 08/29/2013 - neoplastic gastric process is not consistent with lymphoma, preliminary reprot - appreciate GI and surgery following  - Continue NG tube - pain management with morphine 3 mg every 2 hours PRN IV for severe pain.    Active problems:  Cough  - Likely related to possible aspiration pneumonia  - Significantly improved - Patient was started on Zosyn 08/23/2013. Zosyn is now discontinued after 7 days of treatment  - Continue Albuterol nebulizer every 4-6 hours PRN  Abnormal liver function tests - somewhat better but ALP persistently elevated  - on EUS ascites appreciated but it was not sampled  Bilateral Lower Extremity Edema  - Likely related to malnutrition and hypoalbuminemia.  - TNA cyclic at this time Hyponatremia  - Related to dehydration.  - Resolved.   Code Status: full code  Family Communication: family at the bedside today  Disposition Plan: home when stable   Consultants:  Gastroenterology  Surgery Procedures:  EGD 08/23/2013  EUS 08/29/2013 Antibiotics:  Zosyn 08/23/2013 --> 08/30/2013   Manson Passey, MD  [facility] Pager [phone]  If 7PM-7AM, please contact night-coverage [email or web address] Password [email or web address] 09/01/2013, 6:21 AM   LOS: 10 days    HPI/Subjective: No overnight events.  Objective: Filed Vitals:   08/31/13 0615 08/31/13 1250 08/31/13 2026 09/01/13 0542  BP: 94/79 104/78 119/70 134/67  Pulse: 92 84 80 80  Temp: 98 F (36.7 C) 98.6 F (37 C) 97.5 F (36.4 C) 97.9 F (36.6 C)  TempSrc: Oral Oral Axillary Oral  Resp: 18 16 20 20   Height:      Weight:      SpO2: 99% 100% 100% 100%    Intake/Output Summary (Last 24 hours) at 09/01/13 6440 Last data filed at 09/01/13 0549  Gross per 24 hour  Intake 2403.16 ml  Output   1500 ml  Net 903.16 ml    Exam:  General: Pt is alert, follows commands appropriately, not in acute distress  Cardiovascular: Regular rate and rhythm, S1/S2 appreciated  Respiratory: Clear to auscultation bilaterally, no wheezing, no crackles, no rhonchi  Abdomen: tender across mid abdomen to light palpation, NGT in place  Extremities: No edema, pulses DP and PT palpable bilaterally  Neuro: Grossly nonfocal   Data Reviewed: Basic Metabolic Panel:  Recent Labs Lab 08/26/13 0503 08/27/13 0513 08/29/13 0625 08/30/13 0535  NA 139 137 135 137  K 3.8 3.5 3.6 3.6  CL 103 102 100 102  CO2 29 31 30 31   GLUCOSE 140* 121* 127* 123*  BUN 9 9 14 17   CREATININE 0.67 0.63 0.65 0.66  CALCIUM 8.2* 8.1* 8.4 8.4  MG 1.9  --   --   --   PHOS 3.2  --  3.5  --  Liver Function Tests:  Recent Labs Lab 08/26/13 0503 08/27/13 0513 08/29/13 0625 08/30/13 0535  AST 143* 50* 95* 49*  ALT 89* 78* 149* 119*  ALKPHOS 341* 427* 536* 511*  BILITOT 0.5 0.3 1.0 0.6  PROT 4.5* 4.6* 4.8* 4.6*  ALBUMIN 1.5* 1.6* 1.6* 1.5*   No results found for this basename: LIPASE, AMYLASE,  in the last 168 hours No results found for this basename: AMMONIA,  in the last 168 hours CBC:  Recent Labs Lab 08/26/13 0503  WBC 5.6  NEUTROABS 4.2  HGB 10.6*  HCT 30.5*   MCV 96.5  PLT 268   Cardiac Enzymes: No results found for this basename: CKTOTAL, CKMB, CKMBINDEX, TROPONINI,  in the last 168 hours BNP: No components found with this basename: POCBNP,  CBG:  Recent Labs Lab 08/31/13 1553 08/31/13 1637 08/31/13 1747 08/31/13 2346 09/01/13 0536  GLUCAP 69* 100* 86 130* 136*    MRSA PCR SCREENING     Status: None   Collection Time    08/23/13  1:30 PM      Result Value Range Status   MRSA by PCR NEGATIVE  NEGATIVE Final     Studies: No results found.  Scheduled Meds: . antiseptic oral rinse  15 mL Mouth Rinse q12n4p  . chlorhexidine  15 mL Mouth Rinse BID  . insulin aspart  0-9 Units Subcutaneous Q6H  . nicotine  14 mg Transdermal Daily  . pantoprazole (PROTONIX) IV  40 mg Intravenous Q12H  . sodium chloride  10-40 mL Intracatheter Q12H   Continuous Infusions: . dextrose 5 % and 0.9% NaCl 20 mL/hr at 08/31/13 0915  . Marland KitchenTPN (CLINIMIX-E) Adult 50 mL/hr at 08/31/13 1753   And  . fat emulsion 250 mL (08/31/13 1752)

## 2013-09-01 NOTE — Progress Notes (Signed)
3 Days Post-Op  Subjective: Mild epigastric discomfort.  Objective: Vital signs in last 24 hours: Temp:  [97.5 F (36.4 C)-98.6 F (37 C)] 97.9 F (36.6 C) (12/14 0542) Pulse Rate:  [80-84] 80 (12/14 0542) Resp:  [16-20] 20 (12/14 0542) BP: (104-134)/(67-78) 134/67 mmHg (12/14 0542) SpO2:  [100 %] 100 % (12/14 0542) Last BM Date: 08/30/13  Intake/Output from previous day: 12/13 0701 - 12/14 0700 In: 3033.2 [I.V.:792.3; TPN:2240.8] Out: 1500 [Urine:950; Emesis/NG output:550] Intake/Output this shift:    PE: General- In NAD Abdomen-soft, flat, mild epigastric tenderness  Lab Results:  No results found for this basename: WBC, HGB, HCT, PLT,  in the last 72 hours BMET  Recent Labs  08/30/13 0535 09/01/13 0550  NA 137 139  K 3.6 3.6  CL 102 105  CO2 31 31  GLUCOSE 123* 123*  BUN 17 16  CREATININE 0.66 0.58  CALCIUM 8.4 8.5   PT/INR No results found for this basename: LABPROT, INR,  in the last 72 hours Comprehensive Metabolic Panel:    Component Value Date/Time   NA 139 09/01/2013 0550   K 3.6 09/01/2013 0550   CL 105 09/01/2013 0550   CO2 31 09/01/2013 0550   BUN 16 09/01/2013 0550   CREATININE 0.58 09/01/2013 0550   GLUCOSE 123* 09/01/2013 0550   CALCIUM 8.5 09/01/2013 0550   AST 45* 09/01/2013 0550   ALT 80* 09/01/2013 0550   ALKPHOS 621* 09/01/2013 0550   BILITOT 0.5 09/01/2013 0550   PROT 4.7* 09/01/2013 0550   ALBUMIN 1.6* 09/01/2013 0550     Studies/Results: No results found.  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 Severe PC malnutrition-last prealbumin 9.7, albumin 1.6 today.   LOS: 10 days   Plan: Continue TPN.  Check prealbumin  tomorrow.  Check path results tomorrow.   Randy Le J 09/01/2013

## 2013-09-01 NOTE — Progress Notes (Addendum)
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)  No Known Allergies  Patient Measurements: Height: 5\' 8"  (172.7 cm) Weight: 130 lb 3.2 oz (59.058 kg) IBW/kg (Calculated) : 68.4  Vital Signs: Temp: 97.9 F (36.6 C) (12/14 0542) Temp src: Oral (12/14 0542) BP: 134/67 mmHg (12/14 0542) Pulse Rate: 80 (12/14 0542) Intake/Output from previous day: 12/13 0701 - 12/14 0700 In: 3033.2 [I.V.:792.3; TPN:2240.8] Out: 1500 [Urine:950; Emesis/NG output:550] Intake/Output from this shift:    Labs: No results found for this basename: WBC, HGB, HCT, PLT, APTT, INR,  in the last 72 hours   Recent Labs  08/30/13 0535 09/01/13 0550  NA 137 139  K 3.6 3.6  CL 102 105  CO2 31 31  GLUCOSE 123* 123*  BUN 17 16  CREATININE 0.66 0.58  CALCIUM 8.4 8.5  MG  --  2.0  PHOS  --  3.4  PROT 4.6* 4.7*  ALBUMIN 1.5* 1.6*  AST 49* 45*  ALT 119* 80*  ALKPHOS 511* 621*  BILITOT 0.6 0.5   Estimated Creatinine Clearance: 84.1 ml/min (by C-G formula based on Cr of 0.58).   Recent Labs  08/31/13 1747 08/31/13 2346 09/01/13 0536  GLUCAP 86 130* 136*   Insulin Requirements in the past 24 hours:  0 units, on Sensitive SSI q6hr.  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 Biopsies obtained during EGD and EUS with FNA.  EGD showed ulcerative esophagitis/gastritis (secondary to gastric outlet obstruction).  Parenteral nutrition started on 12/5.   Unable to place post-pyloric tube for Enteral feedings. 12/11 TNA changed to cyclic in case LFT elevation was due to cont dextrose infusion. 12/14 LFTs have improved(except Alk phos), there are other possible causes for LFT elevation, cyclic TNA was only off for 6 hours each day, and hypoglycemia is a problem while  TNA is off. Therefore, will switch back to continuous at a conservative rate and monitor.   Labs:    Electrolytes: wnl  Calcium:  corrects to 10.4  Renal: SCr wnl and stable.  LFT's: AST/ALT cont to improve. Alk Phos remains elevated.  Glucose: Another hypoglycemic event yesterday while TNA was off. CBGs during TNA infusion: 126-136. Goal (< 150)    TG: 61(12/8)  Pre-albumin 9.7(12/11)  Nutritional Goals:  Re-estimated needs from 12/9 RD note: Kcal: 1770-2065  Protein: 88-106g Fluid: daily Clinimix E5/15 at 51ml/hr + 20% fat emulsion at 92ml/hr will provide 90 grams protein and 1758 kcal/day.  Plan:   Change TNA back to continuous, Clinimix E 5/15 at 75ml.    Change 20% Fat Emulsion to continuous at 76ml/hr.  TNA to contain standard MVI and trace elements daily.   TNA labs on Mondays and Thursdays  Continue Sensitive SSI q6hr  While TNA is off today from 12:00 to 18:00, infuse D10W at 66ml/hr to prevent hypoglycemia.  When TNA starts at 18:00, change IVF to NS at 63ml/hr.  Charolotte Eke, PharmD, pager [phone]. 09/01/2013,7:16 AM.

## 2013-09-02 DIAGNOSIS — C169 Malignant neoplasm of stomach, unspecified: Secondary | ICD-10-CM

## 2013-09-02 LAB — CBC
MCH: 32.4 pg (ref 26.0–34.0)
MCHC: 34.1 g/dL (ref 30.0–36.0)
Platelets: 360 10*3/uL (ref 150–400)
RDW: 13 % (ref 11.5–15.5)

## 2013-09-02 LAB — MAGNESIUM: Magnesium: 2 mg/dL (ref 1.5–2.5)

## 2013-09-02 LAB — COMPREHENSIVE METABOLIC PANEL
AST: 43 U/L — ABNORMAL HIGH (ref 0–37)
Alkaline Phosphatase: 615 U/L — ABNORMAL HIGH (ref 39–117)
BUN: 15 mg/dL (ref 6–23)
Calcium: 8.5 mg/dL (ref 8.4–10.5)
Chloride: 102 mEq/L (ref 96–112)
Creatinine, Ser: 0.65 mg/dL (ref 0.50–1.35)
GFR calc Af Amer: >90 mL/min (ref >90)
GFR calc non Af Amer: >90 mL/min (ref >90)
Glucose, Bld: 116 mg/dL — ABNORMAL HIGH (ref 70–99)
Potassium: 3.4 mEq/L — ABNORMAL LOW (ref 3.5–5.1)
Sodium: 137 mEq/L (ref 135–145)
Total Bilirubin: 0.6 mg/dL (ref 0.3–1.2)

## 2013-09-02 LAB — GLUCOSE, CAPILLARY
Glucose-Capillary: 122 mg/dL — ABNORMAL HIGH (ref 70–99)
Glucose-Capillary: 93 mg/dL (ref 70–99)

## 2013-09-02 LAB — DIFFERENTIAL
Basophils Absolute: 0 10*3/uL (ref 0.0–0.1)
Basophils Relative: 0 % (ref 0–1)
Eosinophils Absolute: 0.1 10*3/uL (ref 0.0–0.7)
Eosinophils Relative: 1 % (ref 0–5)
Monocytes Relative: 8 % (ref 3–12)
Neutro Abs: 5.7 10*3/uL (ref 1.7–7.7)
Neutrophils Relative %: 74 % (ref 43–77)

## 2013-09-02 LAB — PHOSPHORUS: Phosphorus: 3.5 mg/dL (ref 2.3–4.6)

## 2013-09-02 LAB — TRIGLYCERIDES: Triglycerides: 74 mg/dL (ref <150)

## 2013-09-02 LAB — PREALBUMIN: Prealbumin: 11 mg/dL — ABNORMAL LOW (ref 17.0–34.0)

## 2013-09-02 MED ORDER — FAT EMULSION 20 % IV EMUL
250.0000 mL | INTRAVENOUS | Status: AC
  Administered 2013-09-02: 250 mL via INTRAVENOUS
  Filled 2013-09-02: qty 250

## 2013-09-02 MED ORDER — TRACE MINERALS CR-CU-F-FE-I-MN-MO-SE-ZN IV SOLN
INTRAVENOUS | Status: AC
  Administered 2013-09-02: 19:00:00 via INTRAVENOUS
  Filled 2013-09-02: qty 2000

## 2013-09-02 MED ORDER — POTASSIUM CHLORIDE 10 MEQ/50ML IV SOLN
10.0000 meq | INTRAVENOUS | Status: AC
  Administered 2013-09-02 (×4): 10 meq via INTRAVENOUS
  Filled 2013-09-02 (×4): qty 50

## 2013-09-02 NOTE — Progress Notes (Signed)
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)  No Known Allergies  Patient Measurements: Height: 5\' 8"  (172.7 cm) Weight: 130 lb 3.2 oz (59.058 kg) IBW/kg (Calculated) : 68.4  Vital Signs: Temp: 98.1 F (36.7 C) (12/15 0457) Temp src: Oral (12/15 0457) BP: 111/74 mmHg (12/15 0457) Pulse Rate: 84 (12/15 0457) Intake/Output from previous day: 12/14 0701 - 12/15 0700 In: 3435.7 [I.V.:998; TPN:2437.7] Out: 2800 [Urine:1800; Emesis/NG output:1000] Intake/Output from this shift:    Labs:  Recent Labs  09/02/13 0414  WBC 7.7  HGB 9.3*  HCT 27.3*  PLT 360     Recent Labs  09/01/13 0550 09/02/13 0414  NA 139 137  K 3.6 3.4*  CL 105 102  CO2 31 29  GLUCOSE 123* 116*  BUN 16 15  CREATININE 0.58 0.65  CALCIUM 8.5 8.5  MG 2.0 2.0  PHOS 3.4 3.5  PROT 4.7* 4.7*  ALBUMIN 1.6* 1.6*  AST 45* 43*  ALT 80* 70*  ALKPHOS 621* 615*  BILITOT 0.5 0.6  TRIG  --  74   Estimated Creatinine Clearance: 84.1 ml/min (by C-G formula based on Cr of 0.65).   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 Biopsies obtained during EGD and EUS with FNA.  EGD showed ulcerative esophagitis/gastritis (secondary to gastric outlet obstruction).  Parenteral nutrition started on 12/5.   Unable to place post-pyloric tube for Enteral feedings.  12/11 TNA changed to cyclic in case LFT elevation was due to cont dextrose infusion. 12/14 LFTs improved (except Alk phos), there are other possible causes for LFT elevation 12/14 switched back to continuous TNA  due to some hypoglycemia in the 6 hours TNA was off each day  Labs:    Electrolytes: K 3.4, other lytes wnl  Calcium:  corrects to 10.4  Renal: SCr wnl and stable.  LFT's: AST/ALT cont to improve. Alk Phos remains elevated.  Glucose: CBGs better after switch back to continuous TNA, 104-121. Goal (< 150)    TG: 74 (12/15), 61(12/8)  Pre-albumin 9.7(12/11)  Nutritional Goals:  Re-estimated needs from 12/9 RD note: Kcal: 1770-2065  Protein: 88-106g Fluid: daily Clinimix E5/15 at 69ml/hr + 20% fat emulsion at 66ml/hr will provide 90 grams protein and 1758 kcal/day.  Plan:   Continue Clinimix E 5/15 at 44ml/hr  Continue 20% Fat Emulsion at 58ml/hr  TNA to contain standard MVI and trace elements daily.   KCl in 50 mL IV runs x 4  BMET in AM  TNA labs on Mondays and Thursdays  Continue Sensitive SSI q6hr  Thank you for the consult.  Tomi Bamberger, PharmD, BCPS Clinical Pharmacist Pager: [phone] Pharmacy: [phone] 09/02/2013 9:51 AM

## 2013-09-02 NOTE — Progress Notes (Signed)
[facility] PROGRESS NOTE  MCCRAE SPECIALE ZOX:[id number] DOB: 06-08-1956 DOA: 08/22/2013 PCP: Kirk Ruths, MD  Brief narrative: 57 y.o. male with a past medical history of hypertension, COPD who presented to [facility] 08/22/2013 with reports of abdominal pain associated with 25 lbs weight loss over 5 weeks prior to this admission, poor oral intake, generalized weakness. Patient underwent EGD by biopsies were non-confirmatory of carcinoma. Subsequent evaluation included EUS and we are awaiting biopsy results at this time. PT has NG tube in place for gastric outlet obstruction.   Assessment/Plan:   Principal Problem:  Abdominal pain secondary to gastric outlet obstruction  - EGD done 08/23/2013 with findings of gastric outlet obstruction, linitis plastica or gastric lymphoma. Biopsy was non-confirmatory of carcinoma  - had EUS 08/29/2013 - likely malignancy  - appreciate GI and surgery following  - Continue NG tube; secretions clearing up - Continue current pain management with morphine 3 mg every 2 hours PRN IV for severe pain.   Active problems:  Cough  - Likely related to possible aspiration pneumonia  - Significantly improved  - Patient was started on Zosyn 08/23/2013. Zosyn is now discontinued after 7 days of treatment  - Continue Albuterol nebulizer every 4-6 hours PRN  Abnormal liver function tests  - somewhat better but ALP persistently elevated  - on EUS ascites appreciated but it was not sampled  Bilateral Lower Extremity Edema  - Likely related to malnutrition and hypoalbuminemia.  - TNA continuous  Hyponatremia  - Related to dehydration.  - Resolved.    Code Status: full code  Family Communication: family not at the bedside today  Disposition Plan: home when stable   Consultants:  Gastroenterology  Surgery Procedures:  EGD 08/23/2013  EUS 08/29/2013 Antibiotics:  Zosyn 08/23/2013 --> 08/30/2013   Manson Passey, MD  [facility] Pager  [phone]  If 7PM-7AM, please contact night-coverage [email or web address] Password [email or web address] 09/02/2013, 7:22 AM   LOS: 11 days   HPI/Subjective: Feels better this am.  Objective: Filed Vitals:   09/01/13 0542 09/01/13 1255 09/01/13 2046 09/02/13 0457  BP: 134/67 116/68 91/68 111/74  Pulse: 80 86 94 84  Temp: 97.9 F (36.6 C) 97.6 F (36.4 C) 98 F (36.7 C) 98.1 F (36.7 C)  TempSrc: Oral Oral Oral Oral  Resp: 20 16 18 20   Height:      Weight:      SpO2: 100% 100% 100% 100%    Intake/Output Summary (Last 24 hours) at 09/02/13 8295 Last data filed at 09/02/13 0600  Gross per 24 hour  Intake 3435.74 ml  Output   2650 ml  Net 785.74 ml    Exam:  General: Pt is alert, follows commands appropriately, not in acute distress  Cardiovascular: Regular rate and rhythm, S1/S2 appreciated  Respiratory: Clear to auscultation bilaterally, no wheezing, no crackles, no rhonchi  Abdomen: tender across mid abdomen, NGT in place  Extremities: No edema, pulses DP and PT palpable bilaterally  Neuro: Grossly nonfocal   Data Reviewed: Basic Metabolic Panel:  Recent Labs Lab 08/27/13 0513 08/29/13 0625 08/30/13 0535 09/01/13 0550 09/02/13 0414  NA 137 135 137 139 137  K 3.5 3.6 3.6 3.6 3.4*  CL 102 100 102 105 102  CO2 31 30 31 31 29   GLUCOSE 121* 127* 123* 123* 116*  BUN 9 14 17 16 15   CREATININE 0.63 0.65 0.66 0.58 0.65  CALCIUM 8.1* 8.4 8.4 8.5 8.5  MG  --   --   --  2.0 2.0  PHOS  --  3.5  --  3.4 3.5   Liver Function Tests:  Recent Labs Lab 08/27/13 0513 08/29/13 0625 08/30/13 0535 09/01/13 0550 09/02/13 0414  AST 50* 95* 49* 45* 43*  ALT 78* 149* 119* 80* 70*  ALKPHOS 427* 536* 511* 621* 615*  BILITOT 0.3 1.0 0.6 0.5 0.6  PROT 4.6* 4.8* 4.6* 4.7* 4.7*  ALBUMIN 1.6* 1.6* 1.5* 1.6* 1.6*   No results found for this basename: LIPASE, AMYLASE,  in the last 168 hours No results found for this basename: AMMONIA,  in the last 168 hours CBC:  Recent Labs Lab  09/02/13 0414  WBC 7.7  NEUTROABS 5.7  HGB 9.3*  HCT 27.3*  MCV 95.1  PLT 360   Cardiac Enzymes: No results found for this basename: CKTOTAL, CKMB, CKMBINDEX, TROPONINI,  in the last 168 hours BNP: No components found with this basename: POCBNP,  CBG:  Recent Labs Lab 09/01/13 0536 09/01/13 1145 09/01/13 1759 09/01/13 2342 09/02/13 0601  GLUCAP 136* 110* 78 104* 121*    MRSA PCR SCREENING     Status: None   Collection Time    08/23/13  1:30 PM      Result Value Range Status   MRSA by PCR NEGATIVE  NEGATIVE Final     Studies: No results found.  Scheduled Meds: . antiseptic oral rinse  15 mL Mouth Rinse q12n4p  . chlorhexidine  15 mL Mouth Rinse BID  . insulin aspart  0-9 Units Subcutaneous Q6H  . nicotine  14 mg Transdermal Daily  . pantoprazole (PROTONIX) IV  40 mg Intravenous Q12H  . sodium chloride  10-40 mL Intracatheter Q12H   Continuous Infusions: . sodium chloride 20 mL/hr at 09/02/13 0546  . dextrose 20 mL/hr at 09/01/13 1203  . Marland KitchenTPN (CLINIMIX-E) Adult 75 mL/hr at 09/01/13 1752   And  . fat emulsion 250 mL (09/01/13 1753)

## 2013-09-02 NOTE — Progress Notes (Signed)
Patient ID: CATHY ROPP, male   DOB: Jun 23, 1956, 57 y.o.   MRN: [id number] 4 Days Post-Op  Subjective: Pt feels ok today.  Patient had a lot of questions.  The patient and I spent a long time discussing his situation and his cytology results.  We discussed surgery and possible complications and questions he had regarding all of this.  Objective: Vital signs in last 24 hours: Temp:  [97.6 F (36.4 C)-98.1 F (36.7 C)] 98.1 F (36.7 C) (12/15 0457) Pulse Rate:  [84-94] 84 (12/15 0457) Resp:  [16-20] 20 (12/15 0457) BP: (91-116)/(68-74) 111/74 mmHg (12/15 0457) SpO2:  [100 %] 100 % (12/15 0457) Last BM Date: 09/01/13  Intake/Output from previous day: 12/14 0701 - 12/15 0700 In: 3435.7 [I.V.:998; TPN:2437.7] Out: 2800 [Urine:1800; Emesis/NG output:1000] Intake/Output this shift: Total I/O In: -  Out: 100 [Urine:100]  PE: Abd: soft, ND, NGT in place Heart: regular  Lab Results:   Recent Labs  09/02/13 0414  WBC 7.7  HGB 9.3*  HCT 27.3*  PLT 360   BMET  Recent Labs  09/01/13 0550 09/02/13 0414  NA 139 137  K 3.6 3.4*  CL 105 102  CO2 31 29  GLUCOSE 123* 116*  BUN 16 15  CREATININE 0.58 0.65  CALCIUM 8.5 8.5   PT/INR No results found for this basename: LABPROT, INR,  in the last 72 hours CMP     Component Value Date/Time   NA 137 09/02/2013 0414   K 3.4* 09/02/2013 0414   CL 102 09/02/2013 0414   CO2 29 09/02/2013 0414   GLUCOSE 116* 09/02/2013 0414   BUN 15 09/02/2013 0414   CREATININE 0.65 09/02/2013 0414   CALCIUM 8.5 09/02/2013 0414   PROT 4.7* 09/02/2013 0414   ALBUMIN 1.6* 09/02/2013 0414   AST 43* 09/02/2013 0414   ALT 70* 09/02/2013 0414   ALKPHOS 615* 09/02/2013 0414   BILITOT 0.6 09/02/2013 0414   GFRNONAA >90 09/02/2013 0414   GFRAA >90 09/02/2013 0414   Lipase     Component Value Date/Time   LIPASE 61* 08/22/2013 1727       Studies/Results: No results found.  Anti-infectives: Anti-infectives   Start     Dose/Rate Route  Frequency Ordered Stop   08/23/13 1400  piperacillin-tazobactam (ZOSYN) IVPB 3.375 g  Status:  Discontinued     3.375 g 12.5 mL/hr over 240 Minutes Intravenous 3 times per day 08/23/13 1247 08/30/13 1038       Assessment/Plan  1. Gastric malignancy, cytology confirms malignant cells 2. SPCM/TNA 3. H/o tobacco and ETOH use 4. Transaminitis, likely secondary to TNA and possible exacerbated by h/o ETOH use 5.  4 mm nodule left upper lobe.  6.  Severe malnutrition - on TPN  Plan: 1. Await prealbumin lab today and see what type of progress he had made.  His albumin is unchanged. 2. His cytology reveals malignant cells, which confirms this is a malignancy.  Will await pathology to determine type. 3. Cont TNA to improve nutrition.  Surgery timing will depend on nutrition labs.  Possibly later this week.  If by some chance, these aren't improving as we had hoped, it may not be until next week.  He and I have discussed this.  All questions were answered.  LOS: 11 days    OSBORNE,KELLY E 09/02/2013, 9:54 AM Pager: [phone]  Agree with above. On my exam, the patient has 7 to 8 cm mass in the RUQ which is tender.  If this correlates  with a malignancy, the tumor could be unresectable. The big issue is getting his nutrition to the point where he could heal whatever surgery he needs.  HIs Prealb - 11.0, his albumin - 1.6. Both Kelly and I have reviewed in part the operation and its potential complications.  Ovidio Kin, MD, [facility] Pager: [phone] Office phone:  [phone]

## 2013-09-03 LAB — BASIC METABOLIC PANEL
CO2: 29 mEq/L (ref 19–32)
Calcium: 8.6 mg/dL (ref 8.4–10.5)
Chloride: 102 mEq/L (ref 96–112)
GFR calc non Af Amer: >90 mL/min (ref >90)
Sodium: 137 mEq/L (ref 135–145)

## 2013-09-03 LAB — GLUCOSE, CAPILLARY: Glucose-Capillary: 111 mg/dL — ABNORMAL HIGH (ref 70–99)

## 2013-09-03 MED ORDER — TRACE MINERALS CR-CU-F-FE-I-MN-MO-SE-ZN IV SOLN
INTRAVENOUS | Status: AC
  Administered 2013-09-03: 18:00:00 via INTRAVENOUS
  Filled 2013-09-03: qty 2000

## 2013-09-03 MED ORDER — POTASSIUM CHLORIDE 10 MEQ/50ML IV SOLN
10.0000 meq | INTRAVENOUS | Status: AC
  Administered 2013-09-03 (×4): 10 meq via INTRAVENOUS
  Filled 2013-09-03 (×4): qty 50

## 2013-09-03 MED ORDER — FAT EMULSION 20 % IV EMUL
250.0000 mL | INTRAVENOUS | Status: AC
  Administered 2013-09-03: 250 mL via INTRAVENOUS
  Filled 2013-09-03: qty 250

## 2013-09-03 NOTE — Care Management Note (Signed)
   CARE MANAGEMENT NOTE 09/03/2013  Patient:  Randy Le, Randy Le   Account Number:  [id number]  Date Initiated:  08/26/2013  Documentation initiated by:  Rosemary Holms  Subjective/Objective Assessment:   Pt from home where he lives with his wife. He plans on DCing back home and does not anticipate any HH/DMe needs. Admits he is anxious about being Dx'ed with Cancer.     Action/Plan:   Anticipated DC Date:  08/28/2013   Anticipated DC Plan:  HOME/SELF CARE      DC Planning Services  CM consult      Choice offered to / List presented to:             Status of service:  Completed, signed off Medicare Important Message given?   (If response is "NO", the following Medicare IM given date fields will be blank) Date Medicare IM given:   Date Additional Medicare IM given:    Discharge Disposition:  ACUTE TO ACUTE TRANS  Per UR Regulation:    If discussed at Long Length of Stay Meetings, dates discussed:   08/27/2013    Comments:  09/03/13 1256 Lahela Woodin,MSN,RN [phone] Chart reviewed for conitued length of stay. Anticipated surgical intervention. Will continue to follow for possible dc needs.  08/27/13 1515 Arlyss Queen, RN BSN CM Pt to transfer to [facility]. CM on assigned unit will follow for discharge planning needs.  08/26/13 Rosemary Holms RN BSN CM

## 2013-09-03 NOTE — Progress Notes (Addendum)
Patient ID: Randy Le, male   DOB: 17-Aug-1956, 57 y.o.   MRN: [id number] 5 Days Post-Op  Subjective: Pt having about the same amount of pain.  No new complaints  Objective: Vital signs in last 24 hours: Temp:  [98 F (36.7 C)-98.1 F (36.7 C)] 98 F (36.7 C) (12/16 0547) Pulse Rate:  [74-90] 74 (12/16 0547) Resp:  [16] 16 (12/16 0547) BP: (102-119)/(52-72) 102/60 mmHg (12/16 0547) SpO2:  [95 %-99 %] 99 % (12/16 0547) Last BM Date: 09/02/13  Intake/Output from previous day: 12/15 0701 - 12/16 0700 In: 1795 [I.V.:320; IV Piggyback:200; TPN:1275] Out: 1500 [Urine:350; Emesis/NG output:1150] Intake/Output this shift:    PE: Abd: soft, gastric mass palpable. NGT in place  Lab Results:   Recent Labs  09/02/13 0414  WBC 7.7  HGB 9.3*  HCT 27.3*  PLT 360   BMET  Recent Labs  09/02/13 0414 09/03/13 0406  NA 137 137  K 3.4* 3.6  CL 102 102  CO2 29 29  GLUCOSE 116* 124*  BUN 15 14  CREATININE 0.65 0.62  CALCIUM 8.5 8.6   PT/INR No results found for this basename: LABPROT, INR,  in the last 72 hours CMP     Component Value Date/Time   NA 137 09/03/2013 0406   K 3.6 09/03/2013 0406   CL 102 09/03/2013 0406   CO2 29 09/03/2013 0406   GLUCOSE 124* 09/03/2013 0406   BUN 14 09/03/2013 0406   CREATININE 0.62 09/03/2013 0406   CALCIUM 8.6 09/03/2013 0406   PROT 4.7* 09/02/2013 0414   ALBUMIN 1.6* 09/02/2013 0414   AST 43* 09/02/2013 0414   ALT 70* 09/02/2013 0414   ALKPHOS 615* 09/02/2013 0414   BILITOT 0.6 09/02/2013 0414   GFRNONAA >90 09/03/2013 0406   GFRAA >90 09/03/2013 0406   Lipase     Component Value Date/Time   LIPASE 61* 08/22/2013 1727       Studies/Results: No results found.  Anti-infectives: Anti-infectives   Start     Dose/Rate Route Frequency Ordered Stop   08/23/13 1400  piperacillin-tazobactam (ZOSYN) IVPB 3.375 g  Status:  Discontinued     3.375 g 12.5 mL/hr over 240 Minutes Intravenous 3 times per day 08/23/13 1247 08/30/13  1038       Assessment/Plan 1. 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 GOO 3. PCM/TNA  Plan: 1. Will recheck prealbumin on Thursday.  Tentatively plan for possible surgery Thursday or Friday of this week. 2. Cont TNA 3. Cont NGT    LOS: 12 days    OSBORNE,KELLY E 09/03/2013, 9:42 AM Pager: [phone]  Agree with above. I sat down and drew a picture of his anatomy, the possible operations, and the possible complications.  His wife only comes for brief periods of time, so I have not met her yet.  I spent over 40 minutes with the patient reviewing different potential plans. Plan to repeat prealbumin on Thurs.  Ovidio Kin, MD, [facility] Pager: [phone] Office phone:  [phone]

## 2013-09-03 NOTE — Progress Notes (Signed)
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)  No Known Allergies  Patient Measurements: Height: 5\' 8"  (172.7 cm) Weight: 130 lb 3.2 oz (59.058 kg) IBW/kg (Calculated) : 68.4  Vital Signs: Temp: 98 F (36.7 C) (12/16 0547) Temp src: Oral (12/16 0547) BP: 102/60 mmHg (12/16 0547) Pulse Rate: 74 (12/16 0547) Intake/Output from previous day: 12/15 0701 - 12/16 0700 In: 1795 [I.V.:320; IV Piggyback:200; TPN:1275] Out: 1500 [Urine:350; Emesis/NG output:1150]  Labs:  Recent Labs  09/02/13 0414  WBC 7.7  HGB 9.3*  HCT 27.3*  PLT 360     Recent Labs  09/01/13 0550 09/02/13 0414 09/03/13 0406  NA 139 137 137  K 3.6 3.4* 3.6  CL 105 102 102  CO2 31 29 29   GLUCOSE 123* 116* 124*  BUN 16 15 14   CREATININE 0.58 0.65 0.62  CALCIUM 8.5 8.5 8.6  MG 2.0 2.0  --   PHOS 3.4 3.5  --   PROT 4.7* 4.7*  --   ALBUMIN 1.6* 1.6*  --   AST 45* 43*  --   ALT 80* 70*  --   ALKPHOS 621* 615*  --   BILITOT 0.5 0.6  --   PREALBUMIN  --  11.0*  --   TRIG  --  74  --    Estimated Creatinine Clearance: 84.1 ml/min (by C-G formula based on Cr of 0.62).   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 Biopsies obtained during EGD and EUS with FNA.  EGD showed ulcerative esophagitis/gastritis (secondary to gastric outlet obstruction).  Parenteral nutrition started on 12/5.   Unable to place post-pyloric tube for Enteral feedings.   12/11 TNA changed to cyclic in case LFT elevation was due to cont dextrose infusion. 12/14 LFTs improved (except  Alk phos), there are other possible causes for LFT elevation 12/14 switched back to continuous TNA due to some hypoglycemia in the 6 hours TNA was off each day 12/15 to continue TNA for nutritional support prior to surgery to be scheduled once at acceptable nutritional status  Labs:    Electrolytes: K up to 3.6, other lytes wnl  Calcium:  corrects to 10.5  Renal: SCr wnl and stable.  LFT's: AST/ALT cont to improve. Alk Phos remains elevated.  Glucose: CBGs better after switch back to continuous TNA, 104-127. Goal (< 150)    TG: 74 (12/15), 61(12/8)  Pre-albumin 11.0 (12/15), 9.7(12/11)  Nutritional Goals:  Re-estimated needs from 12/9 RD note: Kcal: 1770-2065  Protein: 88-106g Fluid: daily Clinimix E5/15 at 28ml/hr + 20% fat emulsion at 68ml/hr will provide 90 grams protein and 1758 kcal/day.  Plan:   Continue Clinimix E 5/15 at 56ml/hr  Continue 20% Fat Emulsion at 84ml/hr  TNA to contain standard MVI and trace elements daily.   KCl in 50 mL IV runs x 4  BMET in AM  TNA labs on Mondays and Thursdays  Continue Sensitive SSI q6hr  Thank you for the consult.  Tomi Bamberger, PharmD, BCPS Clinical Pharmacist Pager: [phone] Pharmacy: [phone] 09/03/2013 8:32 AM

## 2013-09-03 NOTE — Progress Notes (Signed)
NUTRITION FOLLOW UP  Intervention:   TPN titrated per pharmacy RD to continue to montior  Nutrition Dx:   Inadequate oral intake related to inability to eat, altered GI funtion as evidenced by NPO status, NG tube placed for gastric decompression; ongoing  Goal:   Pt to meet >/= 90% of their estimated nutrition needs; being met  Monitor:   Nutrition Support, Labs, Weights   Assessment:   Pt on TPN since 12/5. Clinimix 5/15 @ Clinimix E 5/15 at 75 mL/hr + 20% fat emulsion at 10 mL/hr. Regimen delivers 90 grams protein and 1758 kcal/day. This meets 97% of estimated energy needs and 100% of estimated protein needs. Pt's weight using the bed scale today was 119 lbs; pt states that when he came to [facility] he weighed 123 lbs.  Per MD note, possible surgery Thursday or Friday.   Blood glucose ranging 116 to 140 mg/dL Low albumin Potassium, magnesium, and phosphorus are WNL.  Height: Ht Readings from Last 1 Encounters:  08/22/13 5\' 8"  (1.727 m)    Weight Status:   Wt Readings from Last 1 Encounters:  08/22/13 130 lb 3.2 oz (59.058 kg)    Re-estimated needs:  Kcal: 1820-2080  Protein: 90-105 grams Fluid: 1.8 L daily   Skin: Intact  Diet Order: NPO   Intake/Output Summary (Last 24 hours) at 09/03/13 1111 Last data filed at 09/03/13 0900  Gross per 24 hour  Intake   1745 ml  Output   1700 ml  Net     45 ml    Last BM: 09/02/13   Labs:   Recent Labs Lab 08/29/13 0625  09/01/13 0550 09/02/13 0414 09/03/13 0406  NA 135  < > 139 137 137  K 3.6  < > 3.6 3.4* 3.6  CL 100  < > 105 102 102  CO2 30  < > 31 29 29   BUN 14  < > 16 15 14   CREATININE 0.65  < > 0.58 0.65 0.62  CALCIUM 8.4  < > 8.5 8.5 8.6  MG  --   --  2.0 2.0  --   PHOS 3.5  --  3.4 3.5  --   GLUCOSE 127*  < > 123* 116* 124*  < > = values in this interval not displayed.  CBG (last 3)   Recent Labs  09/02/13 1754 09/03/13 0003 09/03/13 0605  GLUCAP 93 104* 127*    Scheduled Meds: . antiseptic  oral rinse  15 mL Mouth Rinse q12n4p  . chlorhexidine  15 mL Mouth Rinse BID  . insulin aspart  0-9 Units Subcutaneous Q6H  . nicotine  14 mg Transdermal Daily  . pantoprazole (PROTONIX) IV  40 mg Intravenous Q12H  . potassium chloride  10 mEq Intravenous Q1 Hr x 4  . sodium chloride  10-40 mL Intracatheter Q12H    Continuous Infusions: . sodium chloride 20 mL/hr at 09/02/13 0546  . Marland KitchenTPN (CLINIMIX-E) Adult 75 mL/hr at 09/02/13 1852   And  . fat emulsion 250 mL (09/02/13 1852)  . Marland KitchenTPN (CLINIMIX-E) Adult     And  . fat emulsion      Ian Malkin RD, LDN Inpatient Clinical Dietitian Pager: [phone] After Hours Pager: [phone]

## 2013-09-03 NOTE — Progress Notes (Signed)
[facility] PROGRESS NOTE  Randy Le WUJ:[id number] DOB: 1956/01/08 DOA: 08/22/2013 PCP: Kirk Ruths, MD  Brief narrative: 57 y.o. male with a past medical history of hypertension, COPD who presented to [facility] 08/22/2013 with reports of abdominal pain associated with 25 lbs weight loss over 5 weeks prior to this admission, poor oral intake, generalized weakness. Patient underwent EGD by biopsies were non-confirmatory of carcinoma. Subsequent evaluation included EUS and we are awaiting biopsy results at this time. PT has NG tube in place for gastric outlet obstruction. Per surgery, tentatively planned for this Thursday or Friday.  Assessment/Plan:   Principal Problem:  Abdominal pain secondary to gastric outlet obstruction  - EGD done 08/23/2013 with findings of gastric outlet obstruction, linitis plastica or gastric lymphoma. Biopsy was non-confirmatory of carcinoma  - had EUS 08/29/2013 - likely gastric malignancy - appreciate GI and surgery following; possible surgery Thursday or Friday  - Continue NG tube; secretions clearing up  - Continue current pain management with morphine 3 mg every 2 hours PRN IV for severe pain.   Active problems:  Cough  - Likely related to possible aspiration pneumonia  - Significantly improved  - Patient was started on Zosyn 08/23/2013. Zosyn is now discontinued after 7 days of treatment  - Continue Albuterol nebulizer every 4-6 hours PRN  Abnormal liver function tests  - somewhat better but ALP persistently elevated  - on EUS ascites appreciated but it was not sampled  Bilateral Lower Extremity Edema  - Likely related to malnutrition and hypoalbuminemia.  - Recheck pre-albumin Thursday - TNA continuous  Hyponatremia  - Related to dehydration.  - Resolved.   Code Status: full code  Family Communication: family not at the bedside today  Disposition: home when stable   Consultants:  Gastroenterology  Surgery Procedures:  EGD  08/23/2013  EUS 08/29/2013 Antibiotics:  Zosyn 08/23/2013 --> 08/30/2013   Manson Passey, MD  [facility] Pager [phone]  If 7PM-7AM, please contact night-coverage [email or web address] Password [facility] 09/03/2013, 12:42 PM   LOS: 12 days    HPI/Subjective: Feels better this am.  Objective: Filed Vitals:   09/02/13 0457 09/02/13 1414 09/02/13 2020 09/03/13 0547  BP: 111/74 116/72 119/52 102/60  Pulse: 84 90 82 74  Temp: 98.1 F (36.7 C) 98.1 F (36.7 C) 98.1 F (36.7 C) 98 F (36.7 C)  TempSrc: Oral Oral Oral Oral  Resp: 20 16 16 16   Height:      Weight:      SpO2: 100% 99% 95% 99%    Intake/Output Summary (Last 24 hours) at 09/03/13 1242 Last data filed at 09/03/13 0900  Gross per 24 hour  Intake   1645 ml  Output   1700 ml  Net    -55 ml    Exam:  General: Pt is alert, follows commands appropriately, not in acute distress  Cardiovascular: Regular rate and rhythm, S1/S2 appreciated  Respiratory: Clear to auscultation bilaterally, no wheezing, no crackles, no rhonchi  Abdomen: tender across mid abdomen, NGT in place  Extremities: No edema, pulses DP and PT palpable bilaterally  Neuro: Grossly nonfocal   Data Reviewed: Basic Metabolic Panel:  Recent Labs Lab 08/29/13 0625 08/30/13 0535 09/01/13 0550 09/02/13 0414 09/03/13 0406  NA 135 137 139 137 137  K 3.6 3.6 3.6 3.4* 3.6  CL 100 102 105 102 102  CO2 30 31 31 29 29   GLUCOSE 127* 123* 123* 116* 124*  BUN 14 17 16 15 14   CREATININE 0.65 0.66 0.58 0.65  0.62  CALCIUM 8.4 8.4 8.5 8.5 8.6  MG  --   --  2.0 2.0  --   PHOS 3.5  --  3.4 3.5  --    Liver Function Tests:  Recent Labs Lab 08/29/13 0625 08/30/13 0535 09/01/13 0550 09/02/13 0414  AST 95* 49* 45* 43*  ALT 149* 119* 80* 70*  ALKPHOS 536* 511* 621* 615*  BILITOT 1.0 0.6 0.5 0.6  PROT 4.8* 4.6* 4.7* 4.7*  ALBUMIN 1.6* 1.5* 1.6* 1.6*   No results found for this basename: LIPASE, AMYLASE,  in the last 168 hours No results found for  this basename: AMMONIA,  in the last 168 hours CBC:  Recent Labs Lab 09/02/13 0414  WBC 7.7  NEUTROABS 5.7  HGB 9.3*  HCT 27.3*  MCV 95.1  PLT 360   Cardiac Enzymes: No results found for this basename: CKTOTAL, CKMB, CKMBINDEX, TROPONINI,  in the last 168 hours BNP: No components found with this basename: POCBNP,  CBG:  Recent Labs Lab 09/02/13 1148 09/02/13 1754 09/03/13 0003 09/03/13 0605 09/03/13 1141  GLUCAP 122* 93 104* 127* 128*    No results found for this or any previous visit (from the past 240 hour(s)).   Studies: No results found.  Scheduled Meds: . antiseptic oral rinse  15 mL Mouth Rinse q12n4p  . chlorhexidine  15 mL Mouth Rinse BID  . insulin aspart  0-9 Units Subcutaneous Q6H  . nicotine  14 mg Transdermal Daily  . pantoprazole (PROTONIX) IV  40 mg Intravenous Q12H  . potassium chloride  10 mEq Intravenous Q1 Hr x 4  . sodium chloride  10-40 mL Intracatheter Q12H   Continuous Infusions: . sodium chloride 20 mL/hr at 09/02/13 0546  . Marland KitchenTPN (CLINIMIX-E) Adult 75 mL/hr at 09/02/13 1852   And  . fat emulsion 250 mL (09/02/13 1852)  . Marland KitchenTPN (CLINIMIX-E) Adult     And  . fat emulsion

## 2013-09-04 LAB — BASIC METABOLIC PANEL
BUN: 14 mg/dL (ref 6–23)
CO2: 28 mEq/L (ref 19–32)
Chloride: 102 mEq/L (ref 96–112)
Creatinine, Ser: 0.59 mg/dL (ref 0.50–1.35)
GFR calc Af Amer: >90 mL/min (ref >90)
GFR calc non Af Amer: >90 mL/min (ref >90)
Potassium: 3.7 mEq/L (ref 3.5–5.1)
Sodium: 134 mEq/L — ABNORMAL LOW (ref 135–145)

## 2013-09-04 LAB — GLUCOSE, CAPILLARY
Glucose-Capillary: 131 mg/dL — ABNORMAL HIGH (ref 70–99)
Glucose-Capillary: 113 mg/dL — ABNORMAL HIGH (ref 70–99)

## 2013-09-04 MED ORDER — SODIUM CHLORIDE 0.9 % IV SOLN
INTRAVENOUS | Status: DC
  Administered 2013-09-04 – 2013-09-14 (×7): via INTRAVENOUS
  Filled 2013-09-04 (×10): qty 1000

## 2013-09-04 MED ORDER — FAT EMULSION 20 % IV EMUL
250.0000 mL | INTRAVENOUS | Status: AC
  Administered 2013-09-04: 250 mL via INTRAVENOUS
  Filled 2013-09-04: qty 250

## 2013-09-04 MED ORDER — TRACE MINERALS CR-CU-F-FE-I-MN-MO-SE-ZN IV SOLN
INTRAVENOUS | Status: AC
  Administered 2013-09-04: 17:00:00 via INTRAVENOUS
  Filled 2013-09-04: qty 2000

## 2013-09-04 MED ORDER — ENOXAPARIN SODIUM 30 MG/0.3ML ~~LOC~~ SOLN
30.0000 mg | SUBCUTANEOUS | Status: DC
  Administered 2013-09-04 – 2013-09-05 (×2): 30 mg via SUBCUTANEOUS
  Filled 2013-09-04 (×3): qty 0.3

## 2013-09-04 NOTE — Progress Notes (Signed)
Patient ID: Randy Le, male   DOB: [birth year], 57 y.o.   MRN: [id number] 6 Days Post-Op  Subjective: Pt feels ok today.  No change.  Objective: Vital signs in last 24 hours: Temp:  [97.9 F (36.6 C)-98.1 F (36.7 C)] 98.1 F (36.7 C) (12/17 0557) Pulse Rate:  [73-80] 80 (12/17 0557) Resp:  [18] 18 (12/17 0557) BP: (95-132)/(60-85) 95/60 mmHg (12/17 0557) SpO2:  [97 %-100 %] 97 % (12/17 0557) Last BM Date: 09/03/13  Intake/Output from previous day: 12/16 0701 - 12/17 0700 In: 2363.8 [I.V.:458.3; TPN:1905.4] Out: 2000 [Urine:900; Emesis/NG output:1100] Intake/Output this shift: Total I/O In: -  Out: 200 [Urine:200]  PE: Abd: soft, tender over gastric mass which is palpable, NGT in place  Lab Results:   Recent Labs  09/02/13 0414  WBC 7.7  HGB 9.3*  HCT 27.3*  PLT 360   BMET  Recent Labs  09/03/13 0406 09/04/13 0402  NA 137 134*  K 3.6 3.7  CL 102 102  CO2 29 28  GLUCOSE 124* 131*  BUN 14 14  CREATININE 0.62 0.59  CALCIUM 8.6 8.7   PT/INR No results found for this basename: LABPROT, INR,  in the last 72 hours CMP     Component Value Date/Time   NA 134* 09/04/2013 0402   K 3.7 09/04/2013 0402   CL 102 09/04/2013 0402   CO2 28 09/04/2013 0402   GLUCOSE 131* 09/04/2013 0402   BUN 14 09/04/2013 0402   CREATININE 0.59 09/04/2013 0402   CALCIUM 8.7 09/04/2013 0402   PROT 4.7* 09/02/2013 0414   ALBUMIN 1.6* 09/02/2013 0414   AST 43* 09/02/2013 0414   ALT 70* 09/02/2013 0414   ALKPHOS 615* 09/02/2013 0414   BILITOT 0.6 09/02/2013 0414   GFRNONAA >90 09/04/2013 0402   GFRAA >90 09/04/2013 0402   Lipase     Component Value Date/Time   LIPASE 61* 08/22/2013 1727       Studies/Results: No results found.  Anti-infectives: Anti-infectives   Start     Dose/Rate Route Frequency Ordered Stop   08/23/13 1400  piperacillin-tazobactam (ZOSYN) IVPB 3.375 g  Status:  Discontinued     3.375 g 12.5 mL/hr over 240 Minutes Intravenous 3 times per day  08/23/13 1247 08/30/13 1038       Assessment/Plan  1. Gastric carcinoma, diffuse type 2. H/o tobacco use 3. Abnormal liver function tests 4. SPCM/TNA  Plan: 1. Cont TNA for nutritional support.  Will check prealbumin tomorrow to determine timing of surgery etc. 2. Now that we have pathology, an oncology evaluation may be beneficial per Dr. Ezzard Le.  We are hesitant to think that this will be something that we can resect.  There is a concern we may only be able to bypass him.    LOS: 13 days   Randy Le 09/04/2013, 9:02 AM Pager: [phone]  Patient's wife here today.  I reviewed with her and Randy Le the plan and the limits of surgery/medicine.  Randy Kin, MD, [facility] Surgery Pager: [phone] Office phone:  [phone]

## 2013-09-04 NOTE — Progress Notes (Signed)
PROGRESS NOTE  NAS WAFER ZOX:[id number] DOB: May 11, 1956 DOA: 08/22/2013 PCP: Kirk Ruths, MD  Assessment/Plan: Abdominal pain secondary to gastric outlet obstruction  - EGD done 08/23/2013 with findings of gastric outlet obstruction, linitis plastica or gastric lymphoma. Biopsy was non-confirmatory of carcinoma and underwent EUS 08/29/2013 with biopsy showing highly atypical malignant cells c/w diffuse type gastric carcinoma. Oncology consulted 12/17 given biopsy findings.  - Continue NG tube; secretions clearing up  - Continue current pain management with morphine 3 mg every 2 hours PRN IV for severe pain.  - plan for probable surgery soon, continue TPN Cough  - Likely related to possible aspiration pneumonia, now significantly improved  - Patient was started on Zosyn 08/23/2013 and completed a 7 day treatment  - Continue Albuterol nebulizer every 4-6 hours PRN  Abnormal liver function tests  - somewhat better but ALP persistently elevated  - on EUS ascites appreciated but it was not sampled  Bilateral Lower Extremity Edema  - Likely related to malnutrition and hypoalbuminemia.  Hyponatremia  - Related to dehydration.  - Resolved.   Diet: NPO Fluids: TPN DVT Prophylaxis: Lovenox  Code Status: Full Family Communication: wife bedside  Disposition Plan: inpatient  Consultants:  Surgery  GI  Oncology  Procedures:  EGD 12/5  EUS 12/11   Antibiotics Zosyn 08/23/2013 --> 08/30/2013  HPI/Subjective: Feeling well, pain is controlled.   Objective: Filed Vitals:   09/03/13 0547 09/03/13 1245 09/03/13 2218 09/04/13 0557  BP: 102/60 109/64 132/85 95/60  Pulse: 74 73 80 80  Temp: 98 F (36.7 C) 97.9 F (36.6 C) 98 F (36.7 C) 98.1 F (36.7 C)  TempSrc: Oral Oral Oral Oral  Resp: 16 18 18 18   Height:      Weight:      SpO2: 99% 99% 100% 97%    Intake/Output Summary (Last 24 hours) at 09/04/13 1129 Last data filed at 09/04/13 0850  Gross per 24 hour   Intake 2363.75 ml  Output   1900 ml  Net 463.75 ml   Filed Weights   08/22/13 1600 08/22/13 2307  Weight: 55.792 kg (123 lb) 59.058 kg (130 lb 3.2 oz)    Exam:   General:  NAD  Cardiovascular: regular rate and rhythm, without MRG  Respiratory: good air movement, clear to auscultation throughout, no wheezing, ronchi or rales  Abdomen: soft, mildly tender to palpation, positive bowel sounds; NG tube in place  MSK: no peripheral edema  Neuro: non focal  Data Reviewed: Basic Metabolic Panel:  Recent Labs Lab 08/29/13 0625 08/30/13 0535 09/01/13 0550 09/02/13 0414 09/03/13 0406 09/04/13 0402  NA 135 137 139 137 137 134*  K 3.6 3.6 3.6 3.4* 3.6 3.7  CL 100 102 105 102 102 102  CO2 30 31 31 29 29 28   GLUCOSE 127* 123* 123* 116* 124* 131*  BUN 14 17 16 15 14 14   CREATININE 0.65 0.66 0.58 0.65 0.62 0.59  CALCIUM 8.4 8.4 8.5 8.5 8.6 8.7  MG  --   --  2.0 2.0  --   --   PHOS 3.5  --  3.4 3.5  --   --    Liver Function Tests:  Recent Labs Lab 08/29/13 0625 08/30/13 0535 09/01/13 0550 09/02/13 0414  AST 95* 49* 45* 43*  ALT 149* 119* 80* 70*  ALKPHOS 536* 511* 621* 615*  BILITOT 1.0 0.6 0.5 0.6  PROT 4.8* 4.6* 4.7* 4.7*  ALBUMIN 1.6* 1.5* 1.6* 1.6*   CBC:  Recent Labs Lab  09/02/13 0414  WBC 7.7  NEUTROABS 5.7  HGB 9.3*  HCT 27.3*  MCV 95.1  PLT 360   BNP (last 3 results)  Recent Labs  08/22/13 1727  PROBNP 113.8   CBG:  Recent Labs Lab 09/03/13 0605 09/03/13 1141 09/03/13 1803 09/03/13 2353 09/04/13 0553  GLUCAP 127* 128* 105* 111* 131*   Studies: No results found.  Scheduled Meds: . antiseptic oral rinse  15 mL Mouth Rinse q12n4p  . chlorhexidine  15 mL Mouth Rinse BID  . insulin aspart  0-9 Units Subcutaneous Q6H  . nicotine  14 mg Transdermal Daily  . pantoprazole (PROTONIX) IV  40 mg Intravenous Q12H  . sodium chloride  10-40 mL Intracatheter Q12H   Continuous Infusions: . Marland KitchenTPN (CLINIMIX-E) Adult 75 mL/hr at 09/03/13 1747    And  . fat emulsion 250 mL (09/03/13 1747)  . Marland KitchenTPN (CLINIMIX-E) Adult     And  . fat emulsion    . sodium chloride 0.9 % 1,000 mL with potassium chloride 40 mEq infusion 20 mL/hr at 09/04/13 1610    Principal Problem:   Weight loss, unintentional Active Problems:   GERD (gastroesophageal reflux disease)   Gastric wall thickening   Hyponatremia   Dehydration   Tobacco abuse   COPD (chronic obstructive pulmonary disease)   Pedal edema   Protein-calorie malnutrition, severe   Gastric outlet obstruction   Ascites   Nonspecific (abnormal) findings on radiological and other examination of gastrointestinal tract   Time spent: 25  Pamella Pert, MD [facility] Pager [phone]. If 7 PM - 7 AM, please contact night-coverage at [email or web address], password [facility] 09/04/2013, 11:29 AM  LOS: 13 days

## 2013-09-04 NOTE — Progress Notes (Signed)
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)  No Known Allergies  Patient Measurements: Height: 5\' 8"  (172.7 cm) Weight: 130 lb 3.2 oz (59.058 kg) IBW/kg (Calculated) : 68.4  Vital Signs: Temp: 98.1 F (36.7 C) (12/17 0557) Temp src: Oral (12/17 0557) BP: 95/60 mmHg (12/17 0557) Pulse Rate: 80 (12/17 0557) Intake/Output from previous day: 12/16 0701 - 12/17 0700 In: 2363.8 [I.V.:458.3; TPN:1905.4] Out: 2000 [Urine:900; Emesis/NG output:1100]  Labs:  Recent Labs  09/02/13 0414  WBC 7.7  HGB 9.3*  HCT 27.3*  PLT 360     Recent Labs  09/02/13 0414 09/03/13 0406 09/04/13 0402  NA 137 137 134*  K 3.4* 3.6 3.7  CL 102 102 102  CO2 29 29 28   GLUCOSE 116* 124* 131*  BUN 15 14 14   CREATININE 0.65 0.62 0.59  CALCIUM 8.5 8.6 8.7  MG 2.0  --   --   PHOS 3.5  --   --   PROT 4.7*  --   --   ALBUMIN 1.6*  --   --   AST 43*  --   --   ALT 70*  --   --   ALKPHOS 615*  --   --   BILITOT 0.6  --   --   PREALBUMIN 11.0*  --   --   TRIG 74  --   --    Estimated Creatinine Clearance: 84.1 ml/min (by C-G formula based on Cr of 0.59).   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 Biopsies obtained during EGD and EUS with FNA.  EGD showed ulcerative esophagitis/gastritis (secondary to gastric outlet obstruction).  Parenteral nutrition started on 12/5.   Unable to place post-pyloric tube for Enteral feedings.   12/11: TNA changed to cyclic in case LFT elevation was due to cont dextrose infusion. 12/14: LFTs improved (except  Alk phos), there are other possible causes for LFT elevation 12/14: switched back to continuous TNA due to some hypoglycemia in the 6 hours TNA was off each day 12/16: NGT replaced 12/15- continues with 1.1L NGT output past 24h, MD to recheck pre-albumin on 12/18 with possible surgery 12/18 or 12/19  Labs:    Electrolytes: WNL, has required almost daily KCl supplementation to remain WNL  Calcium:  corrects to 10.6  Renal: SCr wnl and stable.  LFT's: AST/ALT cont to improve. Alk Phos remains elevated (12/15)  Glucose: CBGs 105-131. Goal (< 150)    TG: 74 (12/15), 61(12/8)  Pre-albumin 11.0 (12/15), 9.7(12/11)  Nutritional Goals:  Re-estimated needs from 12/9 RD note: Kcal: 1770-2065  Protein: 88-106g Fluid: daily Clinimix E5/15 at 52ml/hr + 20% fat emulsion at 28ml/hr will provide 90 grams protein and 1758 kcal/day.  Plan:   Continue Clinimix E 5/15 at 66ml/hr  Continue 20% Fat Emulsion at 46ml/hr  TNA to contain standard MVI and trace elements daily.   Change MIVF to NS with KCl 40 mEq at 20 ml/hr to deliver ~20 mEq KCl over 24h  TNA labs on Mondays and Thursdays  Continue Sensitive SSI q6hr  Thank you for the  consult.  Tomi Bamberger, PharmD, BCPS Clinical Pharmacist Pager: [phone] Pharmacy: [phone] 09/04/2013 8:15 AM

## 2013-09-05 LAB — GLUCOSE, CAPILLARY
Glucose-Capillary: 106 mg/dL — ABNORMAL HIGH (ref 70–99)
Glucose-Capillary: 107 mg/dL — ABNORMAL HIGH (ref 70–99)
Glucose-Capillary: 115 mg/dL — ABNORMAL HIGH (ref 70–99)
Glucose-Capillary: 116 mg/dL — ABNORMAL HIGH (ref 70–99)

## 2013-09-05 LAB — CBC
HCT: 29 % — ABNORMAL LOW (ref 39.0–52.0)
Hemoglobin: 9.7 g/dL — ABNORMAL LOW (ref 13.0–17.0)
MCHC: 33.4 g/dL (ref 30.0–36.0)
RBC: 3.05 MIL/uL — ABNORMAL LOW (ref 4.22–5.81)
WBC: 7.5 10*3/uL (ref 4.0–10.5)

## 2013-09-05 LAB — COMPREHENSIVE METABOLIC PANEL
ALT: 169 U/L — ABNORMAL HIGH (ref 0–53)
AST: 111 U/L — ABNORMAL HIGH (ref 0–37)
Albumin: 1.7 g/dL — ABNORMAL LOW (ref 3.5–5.2)
Alkaline Phosphatase: 896 U/L — ABNORMAL HIGH (ref 39–117)
Creatinine, Ser: 0.6 mg/dL (ref 0.50–1.35)
GFR calc Af Amer: >90 mL/min (ref >90)
Glucose, Bld: 125 mg/dL — ABNORMAL HIGH (ref 70–99)
Potassium: 3.7 mEq/L (ref 3.5–5.1)
Sodium: 135 mEq/L (ref 135–145)
Total Protein: 4.8 g/dL — ABNORMAL LOW (ref 6.0–8.3)

## 2013-09-05 LAB — CEA: CEA: 5.6 ng/mL — ABNORMAL HIGH (ref 0.0–5.0)

## 2013-09-05 MED ORDER — FAT EMULSION 20 % IV EMUL
250.0000 mL | INTRAVENOUS | Status: AC
  Administered 2013-09-05: 250 mL via INTRAVENOUS
  Filled 2013-09-05: qty 250

## 2013-09-05 MED ORDER — TRACE MINERALS CR-CU-F-FE-I-MN-MO-SE-ZN IV SOLN
INTRAVENOUS | Status: AC
  Administered 2013-09-05: 17:00:00 via INTRAVENOUS
  Filled 2013-09-05: qty 2000

## 2013-09-05 NOTE — Progress Notes (Signed)
PROGRESS NOTE  Randy Le VWU:[id number] DOB: 11-13-1955 DOA: 08/22/2013 PCP: Kirk Ruths, MD  Assessment/Plan: Abdominal pain secondary to gastric outlet obstruction  - EGD done 08/23/2013 with findings of gastric outlet obstruction, linitis plastica or gastric lymphoma. Biopsy was non-confirmatory of carcinoma and underwent EUS 08/29/2013 with biopsy showing highly atypical malignant cells c/w diffuse type gastric carcinoma. Oncology consulted 12/17 given biopsy findings, appreciate input.   - Continue NG tube; secretions clearing up  - Continue current pain management with morphine 3 mg every 2 hours PRN IV for severe pain.  - plan for probable surgery soon, continue TPN Cough  - Likely related to possible aspiration pneumonia, now significantly improved  - Patient was started on Zosyn 08/23/2013 and completed a 7 day treatment  - Continue Albuterol nebulizer every 4-6 hours PRN  Abnormal liver function tests  - worsening after TPN, per surgery to place J arm during surgery.  Bilateral Lower Extremity Edema  - Likely related to malnutrition and hypoalbuminemia.  Hyponatremia  - Related to dehydration.  - Resolved.   Diet: NPO Fluids: TPN DVT Prophylaxis: Lovenox  Code Status: Full Family Communication: wife bedside  Disposition Plan: inpatient  Consultants:  Surgery  GI  Oncology  Procedures:  EGD 12/5  EUS 12/11   Antibiotics Zosyn 08/23/2013 --> 08/30/2013  HPI/Subjective: Feeling well, pain is controlled. Awaiting to talk to Oncology about his cancer/treatment options/prognosis.   Objective: Filed Vitals:   09/04/13 0557 09/04/13 1345 09/04/13 2016 09/05/13 0607  BP: 95/60 123/60 106/64 133/65  Pulse: 80 74 74 74  Temp: 98.1 F (36.7 C) 97.8 F (36.6 C) 98.5 F (36.9 C) 98.3 F (36.8 C)  TempSrc: Oral Oral Oral Oral  Resp: 18 18 18 20   Height:      Weight:      SpO2: 97% 99% 100% 99%    Intake/Output Summary (Last 24 hours) at  09/05/13 1226 Last data filed at 09/05/13 0953  Gross per 24 hour  Intake 1211.09 ml  Output   1550 ml  Net -338.91 ml   Filed Weights   08/22/13 1600 08/22/13 2307  Weight: 55.792 kg (123 lb) 59.058 kg (130 lb 3.2 oz)    Exam:  General:  NAD  Cardiovascular: regular rate and rhythm, without MRG  Respiratory: good air movement, clear to auscultation throughout, no wheezing, ronchi or rales  Abdomen: soft, mildly tender to palpation, positive bowel sounds; NG tube in place  MSK: no peripheral edema  Neuro: non focal  Data Reviewed: Basic Metabolic Panel:  Recent Labs Lab 09/01/13 0550 09/02/13 0414 09/03/13 0406 09/04/13 0402 09/05/13 0530  NA 139 137 137 134* 135  K 3.6 3.4* 3.6 3.7 3.7  CL 105 102 102 102 101  CO2 31 29 29 28 26   GLUCOSE 123* 116* 124* 131* 125*  BUN 16 15 14 14 15   CREATININE 0.58 0.65 0.62 0.59 0.60  CALCIUM 8.5 8.5 8.6 8.7 8.5  MG 2.0 2.0  --   --  2.0  PHOS 3.4 3.5  --   --  3.7   Liver Function Tests:  Recent Labs Lab 08/30/13 0535 09/01/13 0550 09/02/13 0414 09/05/13 0530  AST 49* 45* 43* 111*  ALT 119* 80* 70* 169*  ALKPHOS 511* 621* 615* 896*  BILITOT 0.6 0.5 0.6 1.1  PROT 4.6* 4.7* 4.7* 4.8*  ALBUMIN 1.5* 1.6* 1.6* 1.7*   CBC:  Recent Labs Lab 09/02/13 0414 09/05/13 0530  WBC 7.7 7.5  NEUTROABS 5.7  --  HGB 9.3* 9.7*  HCT 27.3* 29.0*  MCV 95.1 95.1  PLT 360 386   BNP (last 3 results)  Recent Labs  08/22/13 1727  PROBNP 113.8   CBG:  Recent Labs Lab 09/04/13 0553 09/04/13 1153 09/04/13 1812 09/05/13 0010 09/05/13 0611  GLUCAP 131* 113* 115* 107* 106*   Studies: No results found.  Scheduled Meds: . antiseptic oral rinse  15 mL Mouth Rinse q12n4p  . chlorhexidine  15 mL Mouth Rinse BID  . enoxaparin (LOVENOX) injection  30 mg Subcutaneous Q24H  . insulin aspart  0-9 Units Subcutaneous Q6H  . nicotine  14 mg Transdermal Daily  . pantoprazole (PROTONIX) IV  40 mg Intravenous Q12H  . sodium  chloride  10-40 mL Intracatheter Q12H   Continuous Infusions: . Marland KitchenTPN (CLINIMIX-E) Adult 75 mL/hr at 09/04/13 1727   And  . fat emulsion 250 mL (09/04/13 1727)  . Marland KitchenTPN (CLINIMIX-E) Adult     And  . fat emulsion    . sodium chloride 0.9 % 1,000 mL with potassium chloride 40 mEq infusion 20 mL/hr at 09/04/13 4540    Principal Problem:   Weight loss, unintentional Active Problems:   GERD (gastroesophageal reflux disease)   Gastric wall thickening   Hyponatremia   Dehydration   Tobacco abuse   COPD (chronic obstructive pulmonary disease)   Pedal edema   Protein-calorie malnutrition, severe   Gastric outlet obstruction   Ascites   Nonspecific (abnormal) findings on radiological and other examination of gastrointestinal tract   Time spent: 25  Pamella Pert, MD [facility] Pager [phone]. If 7 PM - 7 AM, please contact night-coverage at [email or web address], password [facility] 09/05/2013, 12:26 PM  LOS: 14 days

## 2013-09-05 NOTE — Consult Note (Signed)
[facility]  [phone]   HEMATOLOGY ONCOLOGY CONSULTATION   Randy Le  DOB: 1956-04-10  MR#: [id number]  CSN#: [id number]    Requesting Physician: [facility]  Primary MD: Kirk Ruths, MD   Reason for Consult: Gastric Cancer   History of present illness:         58 y.o. white  male asked to see for evaluation of Gastric Cancer.- In review,  He underwent in  2012  an exploratory laparotomy with   partial small bowel resection for closed loop obstruction with partial  infarction of small bowel.   He presented to [facility] ED on 08/22/2013 with progressive abdominal pain accompanied by poor oral intake with subsequent fatigue and weakness,  and a 25 lb weight loss over a period of 5 weeks. Patient initially was scheduled to see a GI specialist as outpatient, but due to symptoms, he had to be evaluated at the ER. His status was complicated with severe GERD symptoms,CT of the chest, abdomen and pelvis on 08/13/2013 with contrast had revealed  esophageal and gastric  dilatation . Patulous GE junction with gastric contents refluxing up into the esophagus were seen.  Small mediastinal lymph nodes,not pathologically enlarged were observed and a  4 mm nodule in the LUL of unknown significance, left upper lobe  Fluid noted in the left pericolic gutter and small to moderate amount of pelvic ascites were seen.Sigmoid colon wall thickening could not be excluded.  Prominent prostate gland was observed. Based on these findings and an epigastric mass being felt on exam which he had noticed about 5 weeks ago, further testing was scheduled.  EGD performed on  08/23/2013 revealed Ulcerative esophagitis(secondary to gastric outlet obstruction). large stomach with food debris.Abnormal antral mucosa with nodularity and friability and pyloric channel stenosis. Multiple biopsies taken suspicious for linitis plastica or gastric lymphoma but with indeterminate pathology with  atypical cells.  Follow up  EUS 08/29/2013 with biopsy (Dr. Christella Hartigan)  demonstrated  highly atypical malignant cells consistent with diffuse type gastric carcinoma. Surgical consultation was obtained; if prealbumin improves, he may or may not  be a surgical candidate for resection of large tumor. a J tube during surgery may be place get him  off of his TNA as soon as possible after surgery. CEA is 5.6 (was 5.7 on 12/12)  No family history of colon cancer. Denies risk factors for HIV or hepatitis. No ETOH.  No Prior radiation. Last colonoscopy in 2007 was negative except for internal hemorrhoids. No family history of gastric cancer.   Past medical history:      Past Medical History  Diagnosis Date  . Hypertension   . Reflux    COPD  Remote ETOH Tobacco habituation   Past surgical history:      Past Surgical History  Procedure Laterality Date  . Hernia repair    . Laparotomy  04/08/2011    Procedure: EXPLORATORY LAPAROTOMY;  Surgeon: Dalia Heading;  Location: [facility] ORS;  Service: General;  Laterality: N/A;  Exploratory Laparotomy with Partial Small Bowel Resection  . Small intestiine surgery    . External ear surgery    . Esophagogastroduodenoscopy N/A 08/23/2013    Procedure: ESOPHAGOGASTRODUODENOSCOPY (EGD);  Surgeon: Malissa Hippo, MD;  Location: [facility] ENDO SUITE;  Service: Endoscopy;  Laterality: N/A;  . Eus N/A 08/29/2013    Procedure: ESOPHAGEAL ENDOSCOPIC ULTRASOUND (EUS) RADIAL;  Surgeon: Rachael Fee, MD;  Location: [facility] ENDOSCOPY;  Service: Endoscopy;  Laterality: N/A;  plus/minus linear    Medications:  Prior to Admission:  Prescriptions prior to admission  Medication Sig Dispense Refill  . metoprolol (TOPROL-XL) 100 MG 24 hr tablet Take 100 mg by mouth every morning.       . Multiple Vitamin (MULTIVITAMIN) capsule Take 1 capsule by mouth daily.        Marland Kitchen NEXIUM 40 MG capsule Take 40 mg by mouth daily.        ZOX:[id number], acetaminophen, albuterol, alum & mag  hydroxide-simeth, ipratropium, menthol-cetylpyridinium, morphine injection, ondansetron (ZOFRAN) IV, ondansetron, phenol, sodium chloride  Allergies: No Known Allergies  Family history:  Mother alive and well. Father died with Alzheimer's. One brother in good health No family history of cancer                                    Social history:   Married.  No  Children. Lives in [place].Works as a machinist.He has been smoking 1-2 packs of cigarettes a day for 38 yrs. Decided to quit during this admission. He did drink heavily in his youth until age 11 , but now he drinks on social occasions, mostly beer.  No recreational drugs.    Review of systems:  Review of Systems  Constitutional: Positive for weight loss and malaise/fatigue.  HENT: Negative for congestion, ear discharge, ear pain, hearing loss, nosebleeds, sore throat and tinnitus.   Eyes: Negative.   Respiratory: Positive for cough, sputum production, shortness of breath and wheezing. Negative for hemoptysis and stridor.   Cardiovascular: Positive for leg swelling. Negative for chest pain, palpitations, orthopnea, claudication and PND.  Gastrointestinal: Positive for heartburn, abdominal pain and diarrhea. Negative for nausea, vomiting, constipation, blood in stool and melena.  Genitourinary: Positive for frequency. Negative for dysuria, urgency, hematuria and flank pain.  Musculoskeletal: Positive for myalgias. Negative for back pain, falls, joint pain and neck pain.  Skin: Negative for itching and rash.  Neurological: Positive for weakness. Negative for dizziness, tingling, tremors, sensory change, speech change, focal weakness, seizures, loss of consciousness and headaches.  Endo/Heme/Allergies: Negative for environmental allergies and polydipsia. Does not bruise/bleed easily.  Psychiatric/Behavioral: Negative for depression, suicidal ideas, hallucinations, memory loss and substance abuse. The patient is not nervous/anxious and  does not have insomnia.   All other systems reviewed and are negative.     Physical exam:      Filed Vitals:   09/05/13 0607  BP: 133/65  Pulse: 74  Temp: 98.3 F (36.8 C)  Resp: 20     Body mass index is 19.8 kg/(m^2).   General: 57 y.o. white  male  in no acute distress A. and O. x3  well-developed and thin HEENT: Normocephalic, atraumatic, PERRLA. Oral cavity without thrush or lesions.NGT in place Neck supple. no thyromegaly, no cervical or supraclavicular adenopathy  Lungs: Distant breath sounds, no respiratory distress Breasts: not examined. Cardiac regular rate and rhythm, no murmur , rubs or gallops Abdomen soft tender to palpation, bowel sounds x4. No HSM. Mid upper abdomen mass GU/rectal: Testes without mass Extremities no clubbing, no  Cyanosis, no leg edema Musculoskeletal: no spinal tenderness.  Neuro: Non Focal, alert and oriented   Lab results:       CBC  Recent Labs Lab 09/02/13 0414 09/05/13 0530  WBC 7.7 7.5  HGB 9.3* 9.7*  HCT 27.3* 29.0*  PLT 360 386  MCV 95.1 95.1  MCH 32.4 31.8  MCHC 34.1 33.4  RDW 13.0 13.5  LYMPHSABS 1.3  --   MONOABS 0.6  --   EOSABS 0.1  --   BASOSABS 0.0  --     Anemia panel:  No results found for this basename: VITAMINB12, FOLATE, FERRITIN, TIBC, IRON, RETICCTPCT,  in the last 72 hours   Chemistries   Recent Labs Lab 09/01/13 0550 09/02/13 0414 09/03/13 0406 09/04/13 0402 09/05/13 0530  NA 139 137 137 134* 135  K 3.6 3.4* 3.6 3.7 3.7  CL 105 102 102 102 101  CO2 31 29 29 28 26   GLUCOSE 123* 116* 124* 131* 125*  BUN 16 15 14 14 15   CREATININE 0.58 0.65 0.62 0.59 0.60  CALCIUM 8.5 8.5 8.6 8.7 8.5  MG 2.0 2.0  --   --  2.0     Coagulation profile No results found for this basename: INR, PROTIME,  in the last 168 hours  Urine Studies No results found for this basename: UACOL, UAPR, USPG, UPH, UTP, UGL, UKET, UBIL, UHGB, UNIT, UROB, ULEU, UEPI, UWBC, URBC, UBAC, CAST, CRYS, UCOM, BILUA,  in the  last 72 hours  Studies:      Ct Chest W Contrast  08/13/2013   as performed following the standard protocol during bolus administration of intravenous contrast.  CONTRAST:  OMNIPAQUE IOHEXOL 300 MG/ML  SOLN  COMPARISON:  Multiple exams, including 08/02/2013 and 04/04/2011.  FINDINGS: CT CHEST FINDINGS  Esophageal dilatation noted. Gastric dilatation noted. Patulous GE junction with gastric contents refluxing up into the esophagus.  Small mediastinal lymph nodes are not pathologically enlarged. coronary and aortic atherosclerosis noted. No pleural effusion. Prominent centrilobular emphysema. No lung mass. Airway thickening is noted particularly in the lower lobes. 4 mm nodule, left upper lobe, image 15 of series 3. New  erate ascites. 8. Prominent prostate gland 9. Lower lumbar facet arthropathy.   Electronically Signed   By: Herbie Baltimore M.D.   On: 08/13/2013 15:49   Ct Abdomen Pelvis W Contrast  08/13/2013  CT ABDOMEN AND PELVIS FINDINGS  Severe gastric distention. Gastric antral wall thickening. Diffuse mesenteric and subcutaneous edema. Cachexia. Mild upper abdominal ascites. Faint dependent density in the gallbladder could represent small gallstones.  Spleen, liver, and pancreas unremarkable. Stable adrenal adenomas. Small left mid kidney cyst. Small left kidney lower pole cyst. Kidneys otherwise unremarkable.  Aortoiliac atherosclerotic vascular disease. Fluid noted in the left pericolic gutter. Small to moderate amount of pelvic ascites. Cannot exclude sigmoid colon wall thickening. Distal ileal anastomotic bowel staple line in the right lower quadrant. Appendix not well observed.  Prominent prostate gland indents the bladder base. Facet arthropathy noted at L5-S1.  IMPRESSION: 1. Prominent gastric antral wall thickening may be due to tumor or inflammation. Stomach body is distended as is the esophagus. Patulous gastroesophageal junction with reflux. 2. Diffuse 3rd spacing of fluid in the  subcutaneous and mesenteric adipose tissue. 3. Cachexia 4. Airway thickening in the lower lobes, favoring bronchitis. 5. A 4 mm nodule in the left upper lobe. If the patient is at high risk for bronchogenic carcinoma, follow-up chest CT at 1 year is recommended. If the patient is at low risk, no follow-up is needed. This recommendation follows the consensus statement: Guidelines for Management of Small Pulmonary Nodules Detected on CT Scans: A Statement from the Fleischner Society as published in Radiology 2005; 237:395-400. 6. Stable adrenal adenomas. 7. Moderate ascites. 8. Prominent prostate gland 9. Lower lumbar facet arthropathy.   Electronically Signed   By: Herbie Baltimore M.D.  On: 08/13/2013 15:49   Dg Chest Port 1 View  08/23/2013   CLINICAL DATA:  PICC placement.  EXAM: PORTABLE CHEST - 1 VIEW  COMPARISON:  Yesterday.  FINDINGS: Interval right PICC with its tip at the cavoatrial junction. Normal sized heart. Clear lungs. Bilateral skin folds. Minimal scoliosis.  IMPRESSION: 1. Right PICC tip at the cavoatrial junction. 2. No acute abnormality.   Electronically Signed   By: Gordan Payment M.D.   On: 08/23/2013 16:06   Dg Abd Acute W/chest  08/22/2013   CLINICAL DATA:  Abdominal pain and lower extremity swelling. History of small bowel resection. Smoker and hypertension.  EXAM: ACUTE ABDOMEN SERIES (ABDOMEN 2 VIEW & CHEST 1 VIEW)  COMPARISON:  CT 08/13/2013.  Plain film chest 08/02/2013.  FINDINGS: Frontal view of the chest demonstrates midline trachea. Normal heart size. Aortic atherosclerosis. No pleural effusion or pneumothorax. Hyperinflation/COPD. Clear lungs.  Abdominal films demonstrate no free intraperitoneal air or significant air-fluid levels on upright positioning. Paucity of small bowel gas on supine imaging. No significant distal gas identified. No abnormal abdominal calcifications. The right-sided abdominal calcification is likely within the enteric stream, when correlated with recent CT.   IMPRESSION: Nonspecific paucity of abdominal pelvic bowel gas. This could relate to ascites, given the appearance of the abdomen on prior CT. Fluid-filled bowel loops cannot be excluded.  No free intraperitoneal air.  Hyperinflation/COPD, without acute cardiopulmonary disease.   Electronically Signed   By: Jeronimo Greaves M.D.   On: 08/22/2013 17:21    Assessmnent/Plan:57 y.o. male  Asked to see for evaluation of gastric cancer consistent with highly atypical malignant cells consistent with diffuse type gastric carcinoma. per EUS on 08/29/2013 . Surgery is evaluating possibility of resection if his nutritional status improves. Patient lives in [place] and is interested in [facility] up at the [facility]. Dr. Truett Perna   is to see the patient following this consult with recommendations regarding diagnosis, treatment options and further workup studies while in the hospital.  An addendum to this note is to be written. Thank you for the referral.   Marcos Eke, PA-C 09/05/2013 Randy Le was interviewed and examined. I reviewed the CT scans and pathology reports.  Impression:  1. Gastric cancer 2. Gastric outlet obstruction secondary to #1 3. Malnutrition 4. Ascites-? Malignant 5. COPD  He presented with gastric outlet obstruction secondary to a distal gastric tumor. He appears to have at least locally advanced disease, but may have carcinomatosis. An NG tube was placed and he is receiving TNA. Plans for surgery are noted.  We will consider systemic chemotherapy and radiation options based on the surgical findings. If he is not able to undergo a curative resection we will discuss palliative chemotherapy.  Recommendations: 1. Nutrition/management of gastric outlet obstruction per internal medicine and surgery 2. I will check on him 09/09/2013. I will followup on the operative findings and make treatment recommendations. 3. I will be out until 09/09/2013. Please call oncology as  needed.

## 2013-09-05 NOTE — Progress Notes (Signed)
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  No Known Allergies  Patient Measurements: Height: 5\' 8"  (172.7 cm) Weight: 130 lb 3.2 oz (59.058 kg) IBW/kg (Calculated) : 68.4  Vital Signs: Temp: 98.3 F (36.8 C) (12/18 1610) Temp src: Oral (12/18 0607) BP: 133/65 mmHg (12/18 0607) Pulse Rate: 74 (12/18 0607)  Intake/Output from previous day: 12/17 0701 - 12/18 0700 In: 1211.1 [I.V.:416.3; TPN:794.8] Out: 1550 [Urine:950; Emesis/NG output:600]   Labs:  Recent Labs  09/05/13 0530  WBC 7.5  HGB 9.7*  HCT 29.0*  PLT 386     Recent Labs  09/03/13 0406 09/04/13 0402 09/05/13 0530  NA 137 134* 135  K 3.6 3.7 3.7  CL 102 102 101  CO2 29 28 26   GLUCOSE 124* 131* 125*  BUN 14 14 15   CREATININE 0.62 0.59 0.60  CALCIUM 8.6 8.7 8.5  MG  --   --  2.0  PHOS  --   --  3.7  PROT  --   --  4.8*  ALBUMIN  --   --  1.7*  AST  --   --  111*  ALT  --   --  169*  ALKPHOS  --   --  896*  BILITOT  --   --  1.1  12/18: Ca corrects to 10.4 Estimated Creatinine Clearance: 84.1 ml/min (by C-G formula based on Cr of 0.6).   Recent Labs  09/04/13 1812 09/05/13 0010 09/05/13 0611  GLUCAP 115* 107* 106*   Insulin Requirements in the past 24 hours:  2 units Novolog, on sensitive SSI q6hr  Nutritional Goals:  Re-estimated needs from 12/9 RD note: Kcal: 1770-2065  Protein: 88-106g Fluid: daily Clinimix E 5/15 at 74ml/hr + 20% fat emulsion at 66ml/hr will provide 90 grams protein and 1758 kcal/day.  Current Nutrition: NPO Continuous TNA Clinimix E 5/15 at 75 mL/hr + 20% fat emulsion at 10 mL/hr MIVF: NS + KCl 40 mEq/L at 20 ml/hr   Assessment: 57yo M with gastric outlet obstruction due to gastric mass.  Biopsy showed gastric carcinoma, diffuse type. Unable to place post-pyloric tube for enteral feedings; parenteral nutrition started 12/5.  12/11: TNA changed to cyclic in case LFT elevation was due to  cont dextrose infusion. 12/14: LFTs improved (except Alk phos), there are other possible causes for LFT elevation 12/14: switched back to continuous TNA due to some hypoglycemia in the 6 hours TNA was off each day 12/16: NGT replaced 12/15- continues with 1.1L NGT output past 24h, MD to recheck pre-albumin on 12/18 with possible surgery 12/18 or 12/19  Labs:    Electrolytes: WNL with KCl in maintenance IVF.  Renal: SCr wnl and stable.  LFT's: transaminases and alk phos rising again  Glucose: CBGs 106-115. Goal (< 150)    TG: 74 (12/15), 61(12/8)  Pre-albumin pending today; 11.0 (12/15), 9.7(12/11)   Plan:   Continue Clinimix E 5/15 at 51ml/hr   Continue 20% Fat Emulsion at 72ml/hr  TNA to contain standard MVI and trace elements daily.  Continue NS with KCl 40 mEq at 20 ml/hr to deliver ~20 mEq KCl over 24h  Follow up on today's prealbumin result when available. Await word on timing of surgery.  Recheck CMet tomorrow - watch LFTs.   Plans per surgery noted for intraoperative J-tube placement to allow conversion to enteral feeds.  TNA labs on Mondays and Thursdays  Continue sensitive-scale SSI q6hr   Elie Goody, PharmD, BCPS Pager:  [phone] 09/05/2013  7:39 AM

## 2013-09-05 NOTE — Progress Notes (Signed)
Patient ID: Randy Le, male   DOB: 1956/08/12, 57 y.o.   MRN: [id number] 7 Days Post-Op  Subjective: Pt feels the same.  No change  Objective: Vital signs in last 24 hours: Temp:  [97.8 F (36.6 C)-98.5 F (36.9 C)] 98.3 F (36.8 C) (12/18 0607) Pulse Rate:  [74] 74 (12/18 0607) Resp:  [18-20] 20 (12/18 0607) BP: (106-133)/(60-65) 133/65 mmHg (12/18 0607) SpO2:  [99 %-100 %] 99 % (12/18 0607) Last BM Date: 09/04/13  Intake/Output from previous day: 12/17 0701 - 12/18 0700 In: 1211.1 [I.V.:416.3; TPN:794.8] Out: 1550 [Urine:950; Emesis/NG output:600] Intake/Output this shift:    PE: Abd: soft, no change, stable, NGT still in place.  Lab Results:   Recent Labs  09/05/13 0530  WBC 7.5  HGB 9.7*  HCT 29.0*  PLT 386   BMET  Recent Labs  09/04/13 0402 09/05/13 0530  NA 134* 135  K 3.7 3.7  CL 102 101  CO2 28 26  GLUCOSE 131* 125*  BUN 14 15  CREATININE 0.59 0.60  CALCIUM 8.7 8.5   PT/INR No results found for this basename: LABPROT, INR,  in the last 72 hours CMP     Component Value Date/Time   NA 135 09/05/2013 0530   K 3.7 09/05/2013 0530   CL 101 09/05/2013 0530   CO2 26 09/05/2013 0530   GLUCOSE 125* 09/05/2013 0530   BUN 15 09/05/2013 0530   CREATININE 0.60 09/05/2013 0530   CALCIUM 8.5 09/05/2013 0530   PROT 4.8* 09/05/2013 0530   ALBUMIN 1.7* 09/05/2013 0530   AST 111* 09/05/2013 0530   ALT 169* 09/05/2013 0530   ALKPHOS 896* 09/05/2013 0530   BILITOT 1.1 09/05/2013 0530   GFRNONAA >90 09/05/2013 0530   GFRAA >90 09/05/2013 0530   Lipase     Component Value Date/Time   LIPASE 61* 08/22/2013 1727       Studies/Results: No results found.  Anti-infectives: Anti-infectives   Start     Dose/Rate Route Frequency Ordered Stop   08/23/13 1400  piperacillin-tazobactam (ZOSYN) IVPB 3.375 g  Status:  Discontinued     3.375 g 12.5 mL/hr over 240 Minutes Intravenous 3 times per day 08/23/13 1247 08/30/13 1038      Assessment/Plan  1. Gastric carcinoma - with gastric outlet obstruction 2. SPCM/TNA 3. Elevated LFTs  Plan: 1. Awaiting prealbumin lab today to determine timing of surgery. 2. Patient has had a sharp, quick elevation of his LFTs with his TNA.  We questioned if some of this may be related to obstruction from his tumor, but his TBili is normal, and his LFTs were normal upon admission.  They only began elevating when TNA was started.  We will try to place a J tube during surgery so we can get the patient off of his TNA as soon as possible after surgery.   LOS: 14 days    OSBORNE,KELLY E 09/05/2013, 8:18 AM Pager: [phone]  Agree with above. Prealbumin - 13.8.  Albumin - 1.7.  Total protein - 4.8.  (09/05/2013) I am LDOW Monday, 09/09/2013, and I will plan surgery that day.  I have reviewed the surgery, its complications, and its limits with the patient and wife. Will get him an IS to work with.  Ovidio Kin, MD, [facility] Pager: [phone] Office phone:  [phone]

## 2013-09-06 DIAGNOSIS — E46 Unspecified protein-calorie malnutrition: Secondary | ICD-10-CM

## 2013-09-06 LAB — COMPREHENSIVE METABOLIC PANEL
AST: 113 U/L — ABNORMAL HIGH (ref 0–37)
BUN: 16 mg/dL (ref 6–23)
CO2: 27 mEq/L (ref 19–32)
Calcium: 8.6 mg/dL (ref 8.4–10.5)
Creatinine, Ser: 0.64 mg/dL (ref 0.50–1.35)
GFR calc Af Amer: >90 mL/min (ref >90)
GFR calc non Af Amer: >90 mL/min (ref >90)
Glucose, Bld: 119 mg/dL — ABNORMAL HIGH (ref 70–99)
Sodium: 137 mEq/L (ref 135–145)
Total Bilirubin: 1.9 mg/dL — ABNORMAL HIGH (ref 0.3–1.2)
Total Protein: 4.7 g/dL — ABNORMAL LOW (ref 6.0–8.3)

## 2013-09-06 LAB — GLUCOSE, CAPILLARY
Glucose-Capillary: 101 mg/dL — ABNORMAL HIGH (ref 70–99)
Glucose-Capillary: 114 mg/dL — ABNORMAL HIGH (ref 70–99)
Glucose-Capillary: 107 mg/dL — ABNORMAL HIGH (ref 70–99)

## 2013-09-06 MED ORDER — ENOXAPARIN SODIUM 40 MG/0.4ML ~~LOC~~ SOLN
40.0000 mg | SUBCUTANEOUS | Status: DC
  Administered 2013-09-06 – 2013-09-15 (×9): 40 mg via SUBCUTANEOUS
  Filled 2013-09-06 (×11): qty 0.4

## 2013-09-06 MED ORDER — FAT EMULSION 20 % IV EMUL
250.0000 mL | INTRAVENOUS | Status: AC
  Administered 2013-09-06: 250 mL via INTRAVENOUS
  Filled 2013-09-06: qty 250

## 2013-09-06 MED ORDER — TRACE MINERALS CR-CU-F-FE-I-MN-MO-SE-ZN IV SOLN
INTRAVENOUS | Status: AC
  Administered 2013-09-06: 18:00:00 via INTRAVENOUS
  Filled 2013-09-06: qty 2000

## 2013-09-06 NOTE — Progress Notes (Signed)
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  No Known Allergies  Patient Measurements: Height: 5\' 8"  (172.7 cm) Weight: 130 lb 3.2 oz (59.058 kg) IBW/kg (Calculated) : 68.4  Vital Signs: Temp: 97.7 F (36.5 C) (12/19 0538) Temp src: Oral (12/19 0538) BP: 115/58 mmHg (12/19 0538) Pulse Rate: 68 (12/19 0538)  Intake/Output from previous day: 12/18 0701 - 12/19 0700 In: 3792.3 [I.V.:477.3; TPN:3315] Out: 950 [Urine:450; Emesis/NG output:500]  Labs:  Recent Labs  09/05/13 0530  WBC 7.5  HGB 9.7*  HCT 29.0*  PLT 386    Recent Labs  09/04/13 0402 09/05/13 0530 09/06/13 0515  NA 134* 135 137  K 3.7 3.7 3.9  CL 102 101 104  CO2 28 26 27   GLUCOSE 131* 125* 119*  BUN 14 15 16   CREATININE 0.59 0.60 0.64  CALCIUM 8.7 8.5 8.6  MG  --  2.0  --   PHOS  --  3.7  --   PROT  --  4.8* 4.7*  ALBUMIN  --  1.7* 1.7*  AST  --  111* 113*  ALT  --  169* 165*  ALKPHOS  --  896* 920*  BILITOT  --  1.1 1.9*  PREALBUMIN  --  13.8*  --   12/18: Ca corrects to 10.0 Estimated Creatinine Clearance: 84.1 ml/min (by C-G formula based on Cr of 0.64).   Recent Labs  09/05/13 1827 09/06/13 0031 09/06/13 0541  GLUCAP 124* 117* 101*   Insulin Requirements in the past 24 hours:  1 units Novolog, on sensitive SSI q6hr (124-101)  Nutritional Goals:  Re-estimated needs from 12/9 RD note: Kcal: 1770-2065  Protein: 88-106g Fluid: daily Clinimix E 5/15 at 73ml/hr + 20% fat emulsion at 13ml/hr will provide 90 grams protein and 1758 kcal/day.  Current Nutrition: NPO Continuous TNA Clinimix E 5/15 at 75 mL/hr + 20% fat emulsion at 10 mL/hr MIVF: NS + KCl 40 mEq/L at 20 ml/hr  Assessment: 57yo M with gastric outlet obstruction due to gastric mass.  Biopsy showed gastric carcinoma, diffuse type. Unable to place post-pyloric tube for enteral feedings; parenteral nutrition started 12/5.  12/11: TNA changed to cyclic  in case LFT elevation was due to cont dextrose infusion. 12/14: LFTs improved (except Alk phos), there are other possible causes for LFT elevation 12/14: switched back to continuous TNA due to some hypoglycemia in the 6 hours TNA was off each day 12/16: NGT replaced 12/15- continues with 1.1L NGT output past 24h, MD to recheck pre-albumin on 12/18 with possible surgery 12/18 or 12/19 12/29: Plan surgery Monday, with J tube placement. Bilirubin increased, AST/ALT 3x ULN, Alk Phos 8xULN. Likely multi-factorial with TNA (elevation occurred more rapidly than usual), possible obstruction, previous hx of ETOH use, remote possibility liver mets (none seen on CT 11/25)  Labs:    Electrolytes: WNL with KCl in maintenance IVF.  Renal: SCr wnl and stable.  LFT's: transaminases and alk phos rising again  Glucose: CBGs 106-115. Goal (< 150)    TG: 74 (12/15), 61(12/8)  Pre-albumin; 11.0 (12/15), 9.7(12/11), 13.8 (12/18)  Plan:   Continue Clinimix E 5/15 at 13ml/hr   Continue 20% Fat Emulsion at 59ml/hr  TNA to contain standard MVI and trace elements daily.  Continue NS with KCl 40 mEq at 20 ml/hr to deliver ~20 mEq KCl over 24h  Timing of surgery: Monday, plan J tube placement for enteral feed when able to tolerate  TNA labs on Mondays  and Thursdays  Continue sensitive-scale SSI q6hr  CMET in AM, following LFT's  Otho Bellows PharmD Pager [phone] 09/06/2013, 8:35 AM

## 2013-09-06 NOTE — Progress Notes (Signed)
Patient ID: Randy Le, male   DOB: 04-11-1956, 57 y.o.   MRN: [id number] 8 Days Post-Op  Subjective: Pt having a tough day today.  Sad today, understandably so.  Otherwise no change  Objective: Vital signs in last 24 hours: Temp:  [97.7 F (36.5 C)-98.6 F (37 C)] 97.7 F (36.5 C) (12/19 0538) Pulse Rate:  [65-75] 68 (12/19 0538) Resp:  [16-18] 16 (12/19 0538) BP: (109-117)/(58-70) 115/58 mmHg (12/19 0538) SpO2:  [100 %] 100 % (12/19 0538) Last BM Date: 09/04/13  Intake/Output from previous day: 12/18 0701 - 12/19 0700 In: 3792.3 [I.V.:477.3; TPN:3315] Out: 950 [Urine:450; Emesis/NG output:500] Intake/Output this shift:    PE: Abd: soft, stable, NGT with watered down output  Lab Results:   Recent Labs  09/05/13 0530  WBC 7.5  HGB 9.7*  HCT 29.0*  PLT 386   BMET  Recent Labs  09/05/13 0530 09/06/13 0515  NA 135 137  K 3.7 3.9  CL 101 104  CO2 26 27  GLUCOSE 125* 119*  BUN 15 16  CREATININE 0.60 0.64  CALCIUM 8.5 8.6   PT/INR No results found for this basename: LABPROT, INR,  in the last 72 hours CMP     Component Value Date/Time   NA 137 09/06/2013 0515   K 3.9 09/06/2013 0515   CL 104 09/06/2013 0515   CO2 27 09/06/2013 0515   GLUCOSE 119* 09/06/2013 0515   BUN 16 09/06/2013 0515   CREATININE 0.64 09/06/2013 0515   CALCIUM 8.6 09/06/2013 0515   PROT 4.7* 09/06/2013 0515   ALBUMIN 1.7* 09/06/2013 0515   AST 113* 09/06/2013 0515   ALT 165* 09/06/2013 0515   ALKPHOS 920* 09/06/2013 0515   BILITOT 1.9* 09/06/2013 0515   GFRNONAA >90 09/06/2013 0515   GFRAA >90 09/06/2013 0515   Lipase     Component Value Date/Time   LIPASE 61* 08/22/2013 1727       Studies/Results: No results found.  Anti-infectives: Anti-infectives   Start     Dose/Rate Route Frequency Ordered Stop   08/23/13 1400  piperacillin-tazobactam (ZOSYN) IVPB 3.375 g  Status:  Discontinued     3.375 g 12.5 mL/hr over 240 Minutes Intravenous 3 times per day 08/23/13  1247 08/30/13 1038       Assessment/Plan  1. Gastric carcinoma, diffuse type with GOO 2. PCM/TNA  Plan: 1. Appreciate Dr. Truett Perna evaluating the patient today. 2. Cont TNA for now. Will plan on at least J tube during surgery so hopefully we can stop his TNA ASAP secondary to continued rising LFTs. 3. Plan for surgery Monday afternoon or Tuesday am if scheduling issues on monday   LOS: 15 days    OSBORNE,KELLY E 09/06/2013, 8:25 AM Pager: [phone]  Agree with above.  Has been seen by Dr. Truett Perna. Targeting surgery for Monday, 09/09/2013. I have discussed this thoroughly with the patient and his wife.  Ovidio Kin, MD, [facility] Pager: [phone] Office phone:  [phone]

## 2013-09-06 NOTE — Progress Notes (Signed)
PROGRESS NOTE  Randy Le:[id number] DOB: [birth year] DOA: 08/22/2013 PCP: Kirk Ruths, MD  Assessment/Plan: Abdominal pain secondary to gastric outlet obstruction  - EGD done 08/23/2013 with findings of gastric outlet obstruction, linitis plastica or gastric lymphoma. Biopsy was non-confirmatory of carcinoma and underwent EUS 08/29/2013 with biopsy showing highly atypical malignant cells c/w diffuse type gastric carcinoma. Oncology consulted 12/17 given biopsy findings, appreciate input.   - Continue NG tube; secretions clearing up  - Continue current pain management with morphine 3 mg every 2 hours PRN IV for severe pain.  - plan for probable surgery on Monday once nutritional status improved.  Cough  - Likely related to possible aspiration pneumonia, now significantly improved  - Patient was started on Zosyn 08/23/2013 and completed a 7 day treatment  - Continue Albuterol nebulizer every 4-6 hours PRN  Abnormal liver function tests  - worsening after TPN, per surgery to place J arm during surgery for enteral feeding.  - LFTs stable.  Bilateral Lower Extremity Edema  - Likely related to malnutrition and hypoalbuminemia.  Hyponatremia  - Related to dehydration.  - Resolved.   Diet: NPO Fluids: TPN DVT Prophylaxis: Lovenox  Code Status: Full Family Communication: wife bedside  Disposition Plan: inpatient  Consultants:  Surgery  GI  Oncology  Procedures:  EGD 12/5  EUS 12/11   Antibiotics Zosyn 08/23/2013 --> 08/30/2013  HPI/Subjective: - denies pain, no nausea/vomiting.    Objective: Filed Vitals:   09/05/13 0607 09/05/13 1353 09/05/13 2209 09/06/13 0538  BP: 133/65 109/70 117/61 115/58  Pulse: 74 75 65 68  Temp: 98.3 F (36.8 C) 97.7 F (36.5 C) 98.6 F (37 C) 97.7 F (36.5 C)  TempSrc: Oral Oral Oral Oral  Resp: 20 16 18 16   Height:      Weight:      SpO2: 99% 100% 100% 100%    Intake/Output Summary (Last 24 hours) at 09/06/13  1120 Last data filed at 09/06/13 0934  Gross per 24 hour  Intake 3792.34 ml  Output   1125 ml  Net 2667.34 ml   Filed Weights   08/22/13 1600 08/22/13 2307  Weight: 55.792 kg (123 lb) 59.058 kg (130 lb 3.2 oz)   Exam:  General:  NAD  Cardiovascular: regular rate and rhythm, without MRG  Respiratory: good air movement, clear to auscultation throughout, no wheezing, ronchi or rales  Abdomen: soft, mildly tender to palpation, positive bowel sounds; NG tube in place  MSK: no peripheral edema  Neuro: non focal  Data Reviewed: Basic Metabolic Panel:  Recent Labs Lab 09/01/13 0550 09/02/13 0414 09/03/13 0406 09/04/13 0402 09/05/13 0530 09/06/13 0515  NA 139 137 137 134* 135 137  K 3.6 3.4* 3.6 3.7 3.7 3.9  CL 105 102 102 102 101 104  CO2 31 29 29 28 26 27   GLUCOSE 123* 116* 124* 131* 125* 119*  BUN 16 15 14 14 15 16   CREATININE 0.58 0.65 0.62 0.59 0.60 0.64  CALCIUM 8.5 8.5 8.6 8.7 8.5 8.6  MG 2.0 2.0  --   --  2.0  --   PHOS 3.4 3.5  --   --  3.7  --    Liver Function Tests:  Recent Labs Lab 09/01/13 0550 09/02/13 0414 09/05/13 0530 09/06/13 0515  AST 45* 43* 111* 113*  ALT 80* 70* 169* 165*  ALKPHOS 621* 615* 896* 920*  BILITOT 0.5 0.6 1.1 1.9*  PROT 4.7* 4.7* 4.8* 4.7*  ALBUMIN 1.6* 1.6* 1.7* 1.7*  CBC:  Recent Labs Lab 09/02/13 0414 09/05/13 0530  WBC 7.7 7.5  NEUTROABS 5.7  --   HGB 9.3* 9.7*  HCT 27.3* 29.0*  MCV 95.1 95.1  PLT 360 386   BNP (last 3 results)  Recent Labs  08/22/13 1727  PROBNP 113.8   CBG:  Recent Labs Lab 09/05/13 0611 09/05/13 1209 09/05/13 1827 09/06/13 0031 09/06/13 0541  GLUCAP 106* 116* 124* 117* 101*   Studies: No results found.  Scheduled Meds: . antiseptic oral rinse  15 mL Mouth Rinse q12n4p  . chlorhexidine  15 mL Mouth Rinse BID  . enoxaparin (LOVENOX) injection  40 mg Subcutaneous Q24H  . insulin aspart  0-9 Units Subcutaneous Q6H  . nicotine  14 mg Transdermal Daily  . pantoprazole  (PROTONIX) IV  40 mg Intravenous Q12H  . sodium chloride  10-40 mL Intracatheter Q12H   Continuous Infusions: . Marland KitchenTPN (CLINIMIX-E) Adult 75 mL/hr at 09/05/13 1710   And  . fat emulsion 250 mL (09/05/13 1710)  . Marland KitchenTPN (CLINIMIX-E) Adult     And  . fat emulsion    . sodium chloride 0.9 % 1,000 mL with potassium chloride 40 mEq infusion 20 mL/hr at 09/05/13 2121    Principal Problem:   Weight loss, unintentional Active Problems:   GERD (gastroesophageal reflux disease)   Gastric wall thickening   Hyponatremia   Dehydration   Tobacco abuse   COPD (chronic obstructive pulmonary disease)   Pedal edema   Protein-calorie malnutrition, severe   Gastric outlet obstruction   Ascites   Nonspecific (abnormal) findings on radiological and other examination of gastrointestinal tract   Time spent: 25  Pamella Pert, MD [facility] Pager [phone]. If 7 PM - 7 AM, please contact night-coverage at [email or web address], password [facility] 09/06/2013, 11:20 AM  LOS: 15 days

## 2013-09-06 NOTE — Progress Notes (Signed)
NUTRITION FOLLOW UP  Intervention:   TPN titrated per pharmacy Recommend Daily Weights TF recommendations if J-tube placed: Initiate Osmolite 1.2 @ 20 ml/hr via J tube and increase by 10 ml every 4 hours to goal rate of 70 ml/hr. At goal rate, tube feeding regimen will provide 2016 kcal, 93 grams of protein, and 1378 ml of H2O.  RD to continue to monitor  Nutrition Dx:   Inadequate oral intake related to inability to eat, altered GI funtion as evidenced by NPO status, NG tube placed for gastric decompression; ongoing  Goal:   Pt to meet >/= 90% of their estimated nutrition needs; being met  Monitor:   Nutrition Support, Labs, Weights   Assessment:   57yo M with gastric outlet obstruction due to gastric mass. Biopsy showed gastric carcinoma, diffuse type. Unable to place post-pyloric tube for enteral feedings; parenteral nutrition started 12/5.  Clinimix 5/15 @ Clinimix E 5/15 at 75 mL/hr + 20% fat emulsion at 10 mL/hr. Regimen delivers 90 grams protein and 1758 kcal/day. This meets 97% of estimated energy needs and 100% of estimated protein needs. Pt's weight using the bed scale 12/16 was 119 lbs; pt states that when he came to [facility] he weighed 123 lbs.  Per MD note, possible surgery Monday or Tuesday; Plan on J-tube during surgery to hopefully stop TNA ASAP secondary to continued rising LFT's.   Blood glucose ranging 101 to 131 mg/dL Low albumin Potassium, magnesium, and phosphorus are WNL. Elevated AST, ALT, and Alk Phos  Height: Ht Readings from Last 1 Encounters:  08/22/13 5\' 8"  (1.727 m)    Weight Status:   Wt Readings from Last 1 Encounters:  08/22/13 130 lb 3.2 oz (59.058 kg)    Re-estimated needs:  Kcal: 1820-2080  Protein: 90-105 grams Fluid: 1.8 L daily   Skin: Intact  Diet Order: NPO   Intake/Output Summary (Last 24 hours) at 09/06/13 1020 Last data filed at 09/06/13 0934  Gross per 24 hour  Intake 3792.34 ml  Output   1125 ml  Net 2667.34 ml    Last  BM: 09/04/13   Labs:   Recent Labs Lab 09/01/13 0550 09/02/13 0414  09/04/13 0402 09/05/13 0530 09/06/13 0515  NA 139 137  < > 134* 135 137  K 3.6 3.4*  < > 3.7 3.7 3.9  CL 105 102  < > 102 101 104  CO2 31 29  < > 28 26 27   BUN 16 15  < > 14 15 16   CREATININE 0.58 0.65  < > 0.59 0.60 0.64  CALCIUM 8.5 8.5  < > 8.7 8.5 8.6  MG 2.0 2.0  --   --  2.0  --   PHOS 3.4 3.5  --   --  3.7  --   GLUCOSE 123* 116*  < > 131* 125* 119*  < > = values in this interval not displayed.  CBG (last 3)   Recent Labs  09/05/13 1827 09/06/13 0031 09/06/13 0541  GLUCAP 124* 117* 101*    Scheduled Meds: . antiseptic oral rinse  15 mL Mouth Rinse q12n4p  . chlorhexidine  15 mL Mouth Rinse BID  . enoxaparin (LOVENOX) injection  40 mg Subcutaneous Q24H  . insulin aspart  0-9 Units Subcutaneous Q6H  . nicotine  14 mg Transdermal Daily  . pantoprazole (PROTONIX) IV  40 mg Intravenous Q12H  . sodium chloride  10-40 mL Intracatheter Q12H    Continuous Infusions: . Marland KitchenTPN (CLINIMIX-E) Adult 75 mL/hr at 09/05/13  1710   And  . fat emulsion 250 mL (09/05/13 1710)  . Marland KitchenTPN (CLINIMIX-E) Adult     And  . fat emulsion    . sodium chloride 0.9 % 1,000 mL with potassium chloride 40 mEq infusion 20 mL/hr at 09/05/13 2121    Ian Malkin RD, LDN Inpatient Clinical Dietitian Pager: [phone] After Hours Pager: [phone]

## 2013-09-07 DIAGNOSIS — C169 Malignant neoplasm of stomach, unspecified: Secondary | ICD-10-CM | POA: Diagnosis present

## 2013-09-07 LAB — COMPREHENSIVE METABOLIC PANEL
AST: 113 U/L — ABNORMAL HIGH (ref 0–37)
Albumin: 1.7 g/dL — ABNORMAL LOW (ref 3.5–5.2)
BUN: 16 mg/dL (ref 6–23)
Chloride: 102 mEq/L (ref 96–112)
Creatinine, Ser: 0.66 mg/dL (ref 0.50–1.35)
Potassium: 3.9 mEq/L (ref 3.5–5.1)
Total Bilirubin: 2 mg/dL — ABNORMAL HIGH (ref 0.3–1.2)
Total Protein: 5 g/dL — ABNORMAL LOW (ref 6.0–8.3)

## 2013-09-07 LAB — GLUCOSE, CAPILLARY
Glucose-Capillary: 104 mg/dL — ABNORMAL HIGH (ref 70–99)
Glucose-Capillary: 112 mg/dL — ABNORMAL HIGH (ref 70–99)
Glucose-Capillary: 118 mg/dL — ABNORMAL HIGH (ref 70–99)

## 2013-09-07 MED ORDER — MORPHINE SULFATE 4 MG/ML IJ SOLN
4.0000 mg | INTRAMUSCULAR | Status: DC | PRN
  Administered 2013-09-07 – 2013-09-09 (×19): 4 mg via INTRAVENOUS
  Filled 2013-09-07 (×19): qty 1

## 2013-09-07 MED ORDER — CHLORHEXIDINE GLUCONATE 4 % EX LIQD
1.0000 "application " | Freq: Once | CUTANEOUS | Status: AC
  Administered 2013-09-08: 1 via TOPICAL
  Filled 2013-09-07 (×2): qty 15

## 2013-09-07 MED ORDER — CHLORHEXIDINE GLUCONATE 4 % EX LIQD
1.0000 "application " | Freq: Once | CUTANEOUS | Status: DC
  Filled 2013-09-07: qty 15

## 2013-09-07 MED ORDER — CHLORHEXIDINE GLUCONATE 4 % EX LIQD
1.0000 "application " | Freq: Once | CUTANEOUS | Status: AC
  Administered 2013-09-09: 1 via TOPICAL
  Filled 2013-09-07 (×2): qty 15

## 2013-09-07 MED ORDER — LIP MEDEX EX OINT
1.0000 "application " | TOPICAL_OINTMENT | Freq: Two times a day (BID) | CUTANEOUS | Status: DC
  Administered 2013-09-07 – 2013-09-16 (×18): 1 via TOPICAL
  Filled 2013-09-07: qty 7

## 2013-09-07 MED ORDER — MAGIC MOUTHWASH
15.0000 mL | Freq: Four times a day (QID) | ORAL | Status: DC | PRN
  Administered 2013-09-07 – 2013-09-10 (×10): 15 mL via ORAL
  Filled 2013-09-07 (×9): qty 15

## 2013-09-07 MED ORDER — CEFAZOLIN SODIUM-DEXTROSE 2-3 GM-% IV SOLR
2.0000 g | INTRAVENOUS | Status: AC
  Administered 2013-09-09: 2 g via INTRAVENOUS
  Filled 2013-09-07: qty 50

## 2013-09-07 MED ORDER — METRONIDAZOLE IN NACL 5-0.79 MG/ML-% IV SOLN
500.0000 mg | INTRAVENOUS | Status: AC
  Administered 2013-09-09: 500 mg via INTRAVENOUS
  Filled 2013-09-07: qty 100

## 2013-09-07 MED ORDER — BISACODYL 10 MG RE SUPP: 10.0000 mg | Freq: Two times a day (BID) | RECTAL | Status: DC | PRN

## 2013-09-07 MED ORDER — FAT EMULSION 20 % IV EMUL
250.0000 mL | INTRAVENOUS | Status: AC
  Administered 2013-09-07: 250 mL via INTRAVENOUS
  Filled 2013-09-07: qty 250

## 2013-09-07 MED ORDER — TRACE MINERALS CR-CU-F-FE-I-MN-MO-SE-ZN IV SOLN
INTRAVENOUS | Status: AC
  Administered 2013-09-07: 18:00:00 via INTRAVENOUS
  Filled 2013-09-07: qty 2000

## 2013-09-07 NOTE — Progress Notes (Signed)
Randy Le [id number] 12-29-1955  CARE TEAM:  PCP: Kirk Ruths, MD  Outpatient Care Team: Patient Care Team: Karleen Hampshire, MD as PCP - General (Family Medicine)  Inpatient Treatment Team: Treatment Team: Attending Provider: Leatha Gilding, MD; Technician: Donna Christen, NT; Consulting Physician: Malissa Hippo, MD; Registered Nurse: Maylene Roes, RN; Respiratory Therapist: Ileene Musa, RRT; Rounding Team: Alton Revere, MD; Consulting Physician: Bishop Limbo, MD; Registered Nurse: Gloriajean Dell, RN; Technician: Gwynneth Albright, NT; Registered Nurse: Eugene Garnet, RN; Technician: Sharrell Ku, NT; Technician: Burnett Sheng, NT; Registered Nurse: Lendon Ka, RN; Consulting Physician: Ladene Artist, MD; Physician Assistant: Marcos Eke, PA-C; Registered Nurse: Idamae Lusher, RN; Technician: Gwenlyn Perking, [place]   Subjective:  No major events Tolerating NGT  Objective:  Vital signs:  Filed Vitals:   09/06/13 0538 09/06/13 1346 09/06/13 2119 09/07/13 0617  BP: 115/58 130/72 93/62 142/75  Pulse: 68 73 78 75  Temp: 97.7 F (36.5 C) 97.7 F (36.5 C) 98.8 F (37.1 C) 97.9 F (36.6 C)  TempSrc: Oral Oral Oral Oral  Resp: 16 18 16 16   Height:      Weight:      SpO2: 100% 100% 99% 100%    Last BM Date: 09/04/13  Intake/Output   Yesterday:  12/19 0701 - 12/20 0700 In: 2551.5 [I.V.:486; TPN:2065.5] Out: 1800 [Urine:625; Emesis/NG output:1175] This shift:     Bowel function:  Flatus: n  BM: n  Drain: 1100 NGT  Physical Exam:  General: Pt awake/alert/oriented x4 in no acute distress Eyes: PERRL, normal EOM.  Sclera clear.  No icterus Neuro: CN II-XII intact w/o focal sensory/motor deficits. Lymph: No head/neck/groin lymphadenopathy Psych:  No delerium/psychosis/paranoia HENT: Normocephalic, Mucus membranes moist.  No thrush Neck: Supple, No tracheal deviation Chest: No chest wall pain w  good excursion CV:  Pulses intact.  Regular rhythm MS: Normal AROM mjr joints.  No obvious deformity Abdomen: Soft.  Nondistended.  No evidence of peritonitis.  No incarcerated hernias. Ext:  SCDs BLE.  No mjr edema.  No cyanosis Skin: No petechiae / purpura   Problem List:   Principal Problem:   Gastric cancer Active Problems:   Gastric outlet obstruction   GERD (gastroesophageal reflux disease)   Weight loss, unintentional   Hyponatremia   Dehydration   Tobacco abuse   COPD (chronic obstructive pulmonary disease)   Pedal edema   Protein-calorie malnutrition, severe   Ascites   Assessment  Randy Le  57 y.o. 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 Ezzard Standing.  He has extensively d/w pt/family this admission.  Will f/u Monday.  Call us sooner if you have further concerns  Ardeth Sportsman, M.D., F.A.C.S. Gastrointestinal and Minimally Invasive Surgery [facility] [address], [address] [place], [place] [address] [phone] Main / Paging   09/07/2013   Results:   Labs: Results for orders placed during the hospital encounter of 08/22/13 (from the past 48 hour(s))  GLUCOSE, CAPILLARY     Status: Abnormal   Collection Time    09/05/13 12:09 PM      Result Value Range  Glucose-Capillary 116 (*) 70 - 99 mg/dL   Comment 1 Documented in Chart     Comment 2 Notify RN    GLUCOSE, CAPILLARY     Status: Abnormal   Collection Time    09/05/13  6:27 PM      Result Value Range   Glucose-Capillary 124 (*) 70 - 99 mg/dL   Comment 1 Documented in Chart     Comment 2 Notify RN    GLUCOSE,  CAPILLARY     Status: Abnormal   Collection Time    09/06/13 12:31 AM      Result Value Range   Glucose-Capillary 117 (*) 70 - 99 mg/dL   Comment 1 Notify RN    COMPREHENSIVE METABOLIC PANEL     Status: Abnormal   Collection Time    09/06/13  5:15 AM      Result Value Range   Sodium 137  135 - 145 mEq/L   Potassium 3.9  3.5 - 5.1 mEq/L   Chloride 104  96 - 112 mEq/L   CO2 27  19 - 32 mEq/L   Glucose, Bld 119 (*) 70 - 99 mg/dL   BUN 16  6 - 23 mg/dL   Creatinine, Ser 1.61  0.50 - 1.35 mg/dL   Calcium 8.6  8.4 - 09.6 mg/dL   Total Protein 4.7 (*) 6.0 - 8.3 g/dL   Albumin 1.7 (*) 3.5 - 5.2 g/dL   AST 045 (*) 0 - 37 U/L   ALT 165 (*) 0 - 53 U/L   Alkaline Phosphatase 920 (*) 39 - 117 U/L   Total Bilirubin 1.9 (*) 0.3 - 1.2 mg/dL   GFR calc non Af Amer >90  >90 mL/min   GFR calc Af Amer >90  >90 mL/min   Comment: (NOTE)     The eGFR has been calculated using the CKD EPI equation.     This calculation has not been validated in all clinical situations.     eGFR's persistently <90 mL/min signify possible Chronic Kidney     Disease.  GLUCOSE, CAPILLARY     Status: Abnormal   Collection Time    09/06/13  5:41 AM      Result Value Range   Glucose-Capillary 101 (*) 70 - 99 mg/dL   Comment 1 Notify RN    GLUCOSE, CAPILLARY     Status: Abnormal   Collection Time    09/06/13 12:04 PM      Result Value Range   Glucose-Capillary 114 (*) 70 - 99 mg/dL  GLUCOSE, CAPILLARY     Status: Abnormal   Collection Time    09/06/13  5:40 PM      Result Value Range   Glucose-Capillary 107 (*) 70 - 99 mg/dL  GLUCOSE, CAPILLARY     Status: Abnormal   Collection Time    09/07/13 12:08 AM      Result Value Range   Glucose-Capillary 112 (*) 70 - 99 mg/dL   Comment 1 Notify RN    COMPREHENSIVE METABOLIC PANEL     Status: Abnormal   Collection Time    09/07/13  6:10 AM      Result Value Range   Sodium 136  135 - 145 mEq/L   Potassium 3.9  3.5 - 5.1 mEq/L   Chloride 102  96 - 112 mEq/L   CO2 27   19 - 32 mEq/L   Glucose, Bld 117 (*) 70 - 99 mg/dL   BUN 16  6 - 23 mg/dL  Creatinine, Ser 0.66  0.50 - 1.35 mg/dL   Calcium 8.7  8.4 - 45.4 mg/dL   Total Protein 5.0 (*) 6.0 - 8.3 g/dL   Albumin 1.7 (*) 3.5 - 5.2 g/dL   AST 098 (*) 0 - 37 U/L   ALT 199 (*) 0 - 53 U/L   Alkaline Phosphatase 1004 (*) 39 - 117 U/L   Total Bilirubin 2.0 (*) 0.3 - 1.2 mg/dL   GFR calc non Af Amer >90  >90 mL/min   GFR calc Af Amer >90  >90 mL/min   Comment: (NOTE)     The eGFR has been calculated using the CKD EPI equation.     This calculation has not been validated in all clinical situations.     eGFR's persistently <90 mL/min signify possible Chronic Kidney     Disease.  GLUCOSE, CAPILLARY     Status: Abnormal   Collection Time    09/07/13  6:13 AM      Result Value Range   Glucose-Capillary 118 (*) 70 - 99 mg/dL   Comment 1 Notify RN      Imaging / Studies: No results found.  Medications / Allergies: per chart  Antibiotics: Anti-infectives   Start     Dose/Rate Route Frequency Ordered Stop   09/09/13 0600  metroNIDAZOLE (FLAGYL) IVPB 500 mg    Comments:  Pharmacy may adjust dosing strength, interval, or rate of medication as needed for optimal therapy for the patient Send with patient on call to the OR.  Anesthesia to complete antibiotic administration <55min prior to incision per [facility].   500 mg 100 mL/hr over 60 Minutes Intravenous On call to O.R. 09/07/13 0756 09/10/13 0559   09/09/13 0000  ceFAZolin (ANCEF) IVPB 2 g/50 mL premix     2 g 100 mL/hr over 30 Minutes Intravenous 60 min pre-op 09/07/13 0756     08/23/13 1400  piperacillin-tazobactam (ZOSYN) IVPB 3.375 g  Status:  Discontinued     3.375 g 12.5 mL/hr over 240 Minutes Intravenous 3 times per day 08/23/13 1247 08/30/13 1038       Note: This dictation was prepared with Dragon/digital dictation along with [employer]. Any transcriptional errors that result from this process are  unintentional.

## 2013-09-07 NOTE — Progress Notes (Signed)
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  No Known Allergies  Patient Measurements: Height: 5\' 8"  (172.7 cm) Weight: 130 lb 3.2 oz (59.058 kg) IBW/kg (Calculated) : 68.4  Vital Signs: Temp: 97.9 F (36.6 C) (12/20 0617) Temp src: Oral (12/20 0617) BP: 142/75 mmHg (12/20 0617) Pulse Rate: 75 (12/20 0617)  Intake/Output from previous day: 12/19 0701 - 12/20 0700 In: 2551.5 [I.V.:486; TPN:2065.5] Out: 1800 [Urine:625; Emesis/NG output:1175]  Labs:  Recent Labs  09/05/13 0530  WBC 7.5  HGB 9.7*  HCT 29.0*  PLT 386    Recent Labs  09/05/13 0530 09/06/13 0515 09/07/13 0610  NA 135 137 136  K 3.7 3.9 3.9  CL 101 104 102  CO2 26 27 27   GLUCOSE 125* 119* 117*  BUN 15 16 16   CREATININE 0.60 0.64 0.66  CALCIUM 8.5 8.6 8.7  MG 2.0  --   --   PHOS 3.7  --   --   PROT 4.8* 4.7* 5.0*  ALBUMIN 1.7* 1.7* 1.7*  AST 111* 113* 113*  ALT 169* 165* 199*  ALKPHOS 896* 920* 1004*  BILITOT 1.1 1.9* 2.0*  PREALBUMIN 13.8*  --   --   12/20: Ca corrects to 10.5 Estimated Creatinine Clearance: 84.1 ml/min (by C-G formula based on Cr of 0.66).   Recent Labs  09/06/13 1740 09/07/13 0008 09/07/13 0613  GLUCAP 107* 112* 118*   Insulin Requirements in the past 24 hours:  0 units Novolog, on sensitive SSI q6hr (124-101)  Nutritional Goals:  Re-estimated needs from 12/9 RD note: Kcal: 1770-2065  Protein: 88-106g Fluid: daily Clinimix E 5/15 at 39ml/hr + 20% fat emulsion at 24ml/hr will provide 90 grams protein and 1758 kcal/day.  Current Nutrition: NPO Continuous TNA Clinimix E 5/15 at 75 mL/hr + 20% fat emulsion at 10 mL/hr MIVF: NS + KCl 40 mEq/L at 20 ml/hr  Assessment: 57yo M with gastric outlet obstruction due to gastric mass.  Biopsy showed gastric carcinoma, diffuse type. Unable to place post-pyloric tube for enteral feedings; parenteral nutrition started 12/5.  12/11: TNA changed to  cyclic in case LFT elevation was due to cont dextrose infusion. 12/14: LFTs improved (except Alk phos), there are other possible causes for LFT elevation 12/14: switched back to continuous TNA due to some hypoglycemia in the 6 hours TNA was off each day 12/16: NGT replaced 12/15- continues with 1.1L NGT output past 24h, MD to recheck pre-albumin on 12/18 with possible surgery 12/18 or 12/19 12/19-20: Plan surgery Monday to Tuesday, with J tube placement. Bilirubin increased, AST/ALT 3x ULN, Alk Phos 8xULN. Likely multi-factorial with TNA (elevation occurred more rapidly than usual), possible obstruction, previous hx of ETOH use, remote possibility liver mets (none seen on CT 11/25)  Labs:    Electrolytes: WNL with KCl in maintenance IVF.  Renal: SCr wnl and stable.  LFT's: transaminases, TBili, and alk phos continue rising   Glucose: CBGs at goal (< 150)    TG: 74 (12/15), 61(12/8)  Pre-albumin; 11.0 (12/15), 9.7(12/11), 13.8 (12/18)  Plan:   Continue Clinimix E 5/15 at 52ml/hr   Continue 20% Fat Emulsion at 25ml/hr  TNA to contain standard MVI and trace elements daily.  Continue NS with KCl 40 mEq at 20 ml/hr to deliver ~20 mEq KCl over 24h  Timing of surgery: Monday or Tuesday with plan J tube placement for enteral feed when able to tolerate  TNA labs on Mondays and Thursdays  Continue sensitive-scale  SSI q6hr  CMET in AM, following LFT's  Haynes Hoehn, PharmD, BCPS 09/07/2013, 10:34 AM  Pager: [phone]

## 2013-09-07 NOTE — Progress Notes (Signed)
PROGRESS NOTE  Randy Le AOZ:[id number] DOB: 1956-02-04 DOA: 08/22/2013 PCP: Kirk Ruths, MD  Interval history  57 yo M with HTN, COPD, tobacco abuse, presented to [facility] on 08/22/2013 with weight loss of 25 lbs over 5 weeks, poor po intake, abdominal pain and nausea. CT with gastric antral wall thickening. EGD done 08/23/2013 with findings of gastric outlet obstruction, linitis plastica or gastric lymphoma. Biopsy was non-confirmatory of carcinoma and underwent EUS 08/29/2013 with biopsy showing highly atypical malignant cells c/w diffuse type gastric carcinoma. Surgery and oncology were consulted and patient will undergo surgery 12/22 or 12/23 for resection vs bypass, meanwhile patient NPO, with NG tube, on TPN given poor nutritional status.   Assessment/Plan: Abdominal pain secondary to gastric outlet obstruction due to gastric carcinoma per biopsy.  - Oncology consulted - Continue NG tube; secretions clearing up  - Continue current pain management with morphine 4 mg every 2 hours PRN IV for severe pain.  - plan for probable surgery on Monday once nutritional status improved.  Cough  - Likely related to possible aspiration pneumonia, now significantly improved  - Patient was started on Zosyn 08/23/2013 and completed a 7 day treatment  - Continue Albuterol nebulizer every 4-6 hours PRN  Abnormal liver function tests  - worsening after TPN, per surgery to place J arm during surgery for enteral feeding.  Bilateral Lower Extremity Edema  - Likely related to malnutrition and hypoalbuminemia.  Hyponatremia  - Related to dehydration.  - Resolved.   Diet: NPO Fluids: TPN DVT Prophylaxis: Lovenox  Code Status: Full Family Communication: none today Disposition Plan: inpatient  Consultants:  Surgery  GI  Oncology  Procedures:  EGD 12/5  EUS 12/11   Antibiotics Zosyn 08/23/2013 --> 08/30/2013  HPI/Subjective: - denies pain, no nausea/vomiting.     Objective: Filed Vitals:   09/06/13 0538 09/06/13 1346 09/06/13 2119 09/07/13 0617  BP: 115/58 130/72 93/62 142/75  Pulse: 68 73 78 75  Temp: 97.7 F (36.5 C) 97.7 F (36.5 C) 98.8 F (37.1 C) 97.9 F (36.6 C)  TempSrc: Oral Oral Oral Oral  Resp: 16 18 16 16   Height:      Weight:      SpO2: 100% 100% 99% 100%    Intake/Output Summary (Last 24 hours) at 09/07/13 1117 Last data filed at 09/07/13 0900  Gross per 24 hour  Intake 2551.5 ml  Output   1675 ml  Net  876.5 ml   Filed Weights   08/22/13 1600 08/22/13 2307  Weight: 55.792 kg (123 lb) 59.058 kg (130 lb 3.2 oz)   Exam:  General:  NAD  Cardiovascular: regular rate and rhythm, without MRG  Respiratory: good air movement, clear to auscultation throughout, no wheezing, ronchi or rales  Abdomen: soft, mildly tender to palpation, positive bowel sounds; NG tube in place  MSK: no peripheral edema  Neuro: non focal  Data Reviewed: Basic Metabolic Panel:  Recent Labs Lab 09/01/13 0550 09/02/13 0414 09/03/13 0406 09/04/13 0402 09/05/13 0530 09/06/13 0515 09/07/13 0610  NA 139 137 137 134* 135 137 136  K 3.6 3.4* 3.6 3.7 3.7 3.9 3.9  CL 105 102 102 102 101 104 102  CO2 31 29 29 28 26 27 27   GLUCOSE 123* 116* 124* 131* 125* 119* 117*  BUN 16 15 14 14 15 16 16   CREATININE 0.58 0.65 0.62 0.59 0.60 0.64 0.66  CALCIUM 8.5 8.5 8.6 8.7 8.5 8.6 8.7  MG 2.0 2.0  --   --  2.0  --   --   PHOS 3.4 3.5  --   --  3.7  --   --    Liver Function Tests:  Recent Labs Lab 09/01/13 0550 09/02/13 0414 09/05/13 0530 09/06/13 0515 09/07/13 0610  AST 45* 43* 111* 113* 113*  ALT 80* 70* 169* 165* 199*  ALKPHOS 621* 615* 896* 920* 1004*  BILITOT 0.5 0.6 1.1 1.9* 2.0*  PROT 4.7* 4.7* 4.8* 4.7* 5.0*  ALBUMIN 1.6* 1.6* 1.7* 1.7* 1.7*   CBC:  Recent Labs Lab 09/02/13 0414 09/05/13 0530  WBC 7.7 7.5  NEUTROABS 5.7  --   HGB 9.3* 9.7*  HCT 27.3* 29.0*  MCV 95.1 95.1  PLT 360 386   BNP (last 3  results)  Recent Labs  08/22/13 1727  PROBNP 113.8   CBG:  Recent Labs Lab 09/06/13 0541 09/06/13 1204 09/06/13 1740 09/07/13 0008 09/07/13 0613  GLUCAP 101* 114* 107* 112* 118*   Studies: No results found.  Scheduled Meds: . antiseptic oral rinse  15 mL Mouth Rinse q12n4p  . [START ON 09/09/2013]  ceFAZolin (ANCEF) IV  2 g Intravenous 60 min Pre-Op  . [START ON 09/09/2013] chlorhexidine  1 application Topical Once  . [START ON 09/08/2013] chlorhexidine  1 application Topical Once  . chlorhexidine  15 mL Mouth Rinse BID  . enoxaparin (LOVENOX) injection  40 mg Subcutaneous Q24H  . insulin aspart  0-9 Units Subcutaneous Q6H  . lip balm  1 application Topical BID  . [START ON 09/09/2013] metronidazole  500 mg Intravenous On Call to OR  . nicotine  14 mg Transdermal Daily  . pantoprazole (PROTONIX) IV  40 mg Intravenous Q12H  . sodium chloride  10-40 mL Intracatheter Q12H   Continuous Infusions: . Marland KitchenTPN (CLINIMIX-E) Adult 75 mL/hr at 09/06/13 1740   And  . fat emulsion 250 mL (09/06/13 1740)  . Marland KitchenTPN (CLINIMIX-E) Adult     And  . fat emulsion    . sodium chloride 0.9 % 1,000 mL with potassium chloride 40 mEq infusion 20 mL/hr at 09/05/13 2121    Principal Problem:   Gastric cancer Active Problems:   GERD (gastroesophageal reflux disease)   Weight loss, unintentional   Hyponatremia   Dehydration   Tobacco abuse   COPD (chronic obstructive pulmonary disease)   Pedal edema   Protein-calorie malnutrition, severe   Gastric outlet obstruction   Ascites  Time spent: 25  Pamella Pert, MD [facility] Pager [phone]. If 7 PM - 7 AM, please contact night-coverage at [email or web address], password [facility] 09/07/2013, 11:17 AM  LOS: 16 days

## 2013-09-08 LAB — COMPREHENSIVE METABOLIC PANEL
AST: 102 U/L — ABNORMAL HIGH (ref 0–37)
Alkaline Phosphatase: 914 U/L — ABNORMAL HIGH (ref 39–117)
BUN: 18 mg/dL (ref 6–23)
CO2: 29 mEq/L (ref 19–32)
Calcium: 8.7 mg/dL (ref 8.4–10.5)
Chloride: 106 mEq/L (ref 96–112)
Creatinine, Ser: 0.67 mg/dL (ref 0.50–1.35)
GFR calc Af Amer: >90 mL/min (ref >90)
GFR calc non Af Amer: >90 mL/min (ref >90)
Glucose, Bld: 115 mg/dL — ABNORMAL HIGH (ref 70–99)
Potassium: 3.8 mEq/L (ref 3.5–5.1)
Total Bilirubin: 2.5 mg/dL — ABNORMAL HIGH (ref 0.3–1.2)

## 2013-09-08 LAB — GLUCOSE, CAPILLARY
Glucose-Capillary: 114 mg/dL — ABNORMAL HIGH (ref 70–99)
Glucose-Capillary: 123 mg/dL — ABNORMAL HIGH (ref 70–99)
Glucose-Capillary: 125 mg/dL — ABNORMAL HIGH (ref 70–99)
Glucose-Capillary: 127 mg/dL — ABNORMAL HIGH (ref 70–99)

## 2013-09-08 MED ORDER — FAT EMULSION 20 % IV EMUL
250.0000 mL | INTRAVENOUS | Status: AC
  Administered 2013-09-08: 250 mL via INTRAVENOUS
  Filled 2013-09-08: qty 250

## 2013-09-08 MED ORDER — TRACE MINERALS CR-CU-F-FE-I-MN-MO-SE-ZN IV SOLN
INTRAVENOUS | Status: AC
  Administered 2013-09-08: 17:00:00 via INTRAVENOUS
  Filled 2013-09-08: qty 2000

## 2013-09-08 NOTE — Progress Notes (Signed)
09/08/2013 1449 NCM spoke to pt's wife, Bradon Fester. She received a letter from [facility] in reference to coverage of days in hospital. Explained NCM will be able to follow up on 09/09/2013 with [employer] and explained if she had questions to also contact the toll free number to get additional info on coverage. Isidoro Donning RN CCM Case Mgmt phone [phone]

## 2013-09-08 NOTE — Progress Notes (Signed)
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  No Known Allergies  Patient Measurements: Height: 5\' 8"  (172.7 cm) Weight: 130 lb 3.2 oz (59.058 kg) IBW/kg (Calculated) : 68.4  Vital Signs: Temp: 97.7 F (36.5 C) (12/21 0457) Temp src: Oral (12/21 0457) BP: 131/66 mmHg (12/21 0457) Pulse Rate: 72 (12/21 0457)  Intake/Output from previous day: 12/20 0701 - 12/21 0700 In: 2488.5 [I.V.:474; TPN:2014.5] Out: 3000 [Urine:1850; Emesis/NG output:1150]  Labs: No results found for this basename: WBC, HGB, HCT, PLT, APTT, INR,  in the last 72 hours  Recent Labs  09/06/13 0515 09/07/13 0610 09/08/13 0440  NA 137 136 138  K 3.9 3.9 3.8  CL 104 102 106  CO2 27 27 29   GLUCOSE 119* 117* 115*  BUN 16 16 18   CREATININE 0.64 0.66 0.67  CALCIUM 8.6 8.7 8.7  PROT 4.7* 5.0* 4.7*  ALBUMIN 1.7* 1.7* 1.7*  AST 113* 113* 102*  ALT 165* 199* 185*  ALKPHOS 920* 1004* 914*  BILITOT 1.9* 2.0* 2.5*  12/20: Ca corrects to 10.5 Estimated Creatinine Clearance: 84.1 ml/min (by C-G formula based on Cr of 0.67).   Recent Labs  09/07/13 1750 09/08/13 0002 09/08/13 0610  GLUCAP 118* 115* 123*   Insulin Requirements in the past 24 hours:  1 units Novolog, on sensitive SSI q6hr   Nutritional Goals:  Re-estimated needs from 12/9 RD note: Kcal: 1770-2065  Protein: 88-106g Fluid: daily Clinimix E 5/15 at 61ml/hr + 20% fat emulsion at 46ml/hr will provide 90 grams protein and 1758 kcal/day.  Current Nutrition: NPO Continuous TNA Clinimix E 5/15 at 75 mL/hr + 20% fat emulsion at 10 mL/hr MIVF: NS + KCl 40 mEq/L at 20 ml/hr  Assessment: 57yo M with gastric outlet obstruction due to gastric mass.  Biopsy showed gastric carcinoma, diffuse type. Unable to place post-pyloric tube for enteral feedings; parenteral nutrition started 12/5.  12/11: TNA changed to cyclic in case LFT elevation was due to cont dextrose  infusion. 12/14: LFTs improved (except Alk phos), there are other possible causes for LFT elevation 12/14: switched back to continuous TNA due to some hypoglycemia in the 6 hours TNA was off each day 12/16: NGT replaced 12/15- continues with 1.1L NGT output past 24h, MD to recheck pre-albumin on 12/18 with possible surgery 12/18 or 12/19 12/19-21: Plan surgery Monday to Tuesday, with J tube placement. Bilirubin increased, AST/ALT 3x ULN, Alk Phos 8xULN. Likely multi-factorial with TNA (elevation occurred more rapidly than usual), possible obstruction, previous hx of ETOH use, remote possibility liver mets (none seen on CT 11/25)  Labs:    Electrolytes: WNL with KCl in maintenance IVF.  Renal: SCr wnl and stable.  LFT's: transaminases, TBili, and alk phos elevated, sl decrease overnight  Glucose: CBGs at goal (< 150)    TG: 74 (12/15), 61(12/8)  Pre-albumin; 11.0 (12/15), 9.7(12/11), 13.8 (12/18)  Plan:   Continue Clinimix E 5/15 at 46ml/hr   Continue 20% Fat Emulsion at 39ml/hr  TNA to contain standard MVI and trace elements daily.  Continue NS with KCl 40 mEq at 20 ml/hr to deliver ~20 mEq KCl over 24h  Timing of surgery: Monday or Tuesday with plan J tube placement for enteral feed when able to tolerate  TNA labs on Mondays and Thursdays  Continue sensitive-scale SSI q6hr  CMET in AM, following LFT's  Haynes Hoehn, PharmD, BCPS 09/08/2013, 7:52 AM  Pager: [phone]

## 2013-09-08 NOTE — Progress Notes (Signed)
PROGRESS NOTE  Randy Le HYQ:[id number] DOB: 1956/04/08 DOA: 08/22/2013 PCP: Kirk Ruths, MD  Interval history  57 yo M with HTN, COPD, tobacco abuse, presented to [facility] ED on 08/22/2013 with weight loss of 25 lbs over 5 weeks, poor po intake, abdominal pain and nausea. CT with gastric antral wall thickening. EGD done 08/23/2013 with findings of gastric outlet obstruction, linitis plastica or gastric lymphoma. Biopsy was non-confirmatory of carcinoma and underwent EUS 08/29/2013 with biopsy showing highly atypical malignant cells c/w diffuse type gastric carcinoma. Surgery and oncology were consulted and patient will undergo surgery 12/22 or 12/23 for resection vs bypass, meanwhile patient NPO, with NG tube, on TPN given poor nutritional status.   Assessment/Plan: Abdominal pain secondary to gastric outlet obstruction due to gastric carcinoma per biopsy. NPO for now, TPN, NG tube. Patient with improving nutritional status in preparation for surgery, 11/22 or 11/23 - Continue current pain management  Cough  - Likely related to possible aspiration pneumonia, now significantly improved  - Patient was started on Zosyn 08/23/2013 and completed a 7 day treatment  - Continue Albuterol nebulizer every 4-6 hours PRN  Abnormal liver function tests  - worsening after TPN, per surgery to place J arm during surgery for enteral feeding.  - closely monitored. Bilateral Lower Extremity Edema  - Likely related to malnutrition and hypoalbuminemia.  - much improved.  Hyponatremia  - Related to dehydration.  - Resolved.   Diet: NPO Fluids: TPN DVT Prophylaxis: Lovenox  Code Status: Full Family Communication: none today Disposition Plan: inpatient  Consultants:  Surgery  GI  Oncology  Procedures:  EGD 12/5  EUS 12/11   Antibiotics Zosyn 08/23/2013 --> 08/30/2013  HPI/Subjective: - feels about the same.     Objective: Filed Vitals:   09/07/13 0617 09/07/13 1350 09/07/13 2057  09/08/13 0457  BP: 142/75 103/73 136/66 131/66  Pulse: 75 71 75 72  Temp: 97.9 F (36.6 C) 98 F (36.7 C) 97.9 F (36.6 C) 97.7 F (36.5 C)  TempSrc: Oral Oral Oral Oral  Resp: 16 16 20 20   Height:      Weight:      SpO2: 100% 97% 97% 97%    Intake/Output Summary (Last 24 hours) at 09/08/13 1042 Last data filed at 09/08/13 9528  Gross per 24 hour  Intake 2488.5 ml  Output   2750 ml  Net -261.5 ml   Filed Weights   08/22/13 1600 08/22/13 2307  Weight: 55.792 kg (123 lb) 59.058 kg (130 lb 3.2 oz)   Exam:  General:  NAD  Cardiovascular: regular rate and rhythm, without MRG  Respiratory: good air movement, clear to auscultation throughout, no wheezing, ronchi or rales  Abdomen: soft, mildly tender to palpation, positive bowel sounds; NG tube in place  MSK: no peripheral edema  Neuro: non focal  Data Reviewed: Basic Metabolic Panel:  Recent Labs Lab 09/02/13 0414  09/04/13 0402 09/05/13 0530 09/06/13 0515 09/07/13 0610 09/08/13 0440  NA 137  < > 134* 135 137 136 138  K 3.4*  < > 3.7 3.7 3.9 3.9 3.8  CL 102  < > 102 101 104 102 106  CO2 29  < > 28 26 27 27 29   GLUCOSE 116*  < > 131* 125* 119* 117* 115*  BUN 15  < > 14 15 16 16 18   CREATININE 0.65  < > 0.59 0.60 0.64 0.66 0.67  CALCIUM 8.5  < > 8.7 8.5 8.6 8.7 8.7  MG 2.0  --   --  2.0  --   --   --   PHOS 3.5  --   --  3.7  --   --   --   < > = values in this interval not displayed. Liver Function Tests:  Recent Labs Lab 09/02/13 0414 09/05/13 0530 09/06/13 0515 09/07/13 0610 09/08/13 0440  AST 43* 111* 113* 113* 102*  ALT 70* 169* 165* 199* 185*  ALKPHOS 615* 896* 920* 1004* 914*  BILITOT 0.6 1.1 1.9* 2.0* 2.5*  PROT 4.7* 4.8* 4.7* 5.0* 4.7*  ALBUMIN 1.6* 1.7* 1.7* 1.7* 1.7*   CBC:  Recent Labs Lab 09/02/13 0414 09/05/13 0530  WBC 7.7 7.5  NEUTROABS 5.7  --   HGB 9.3* 9.7*  HCT 27.3* 29.0*  MCV 95.1 95.1  PLT 360 386   BNP (last 3 results)  Recent Labs  08/22/13 1727  PROBNP  113.8   CBG:  Recent Labs Lab 09/07/13 0613 09/07/13 1208 09/07/13 1750 09/08/13 0002 09/08/13 0610  GLUCAP 118* 104* 118* 115* 123*   Studies: No results found.  Scheduled Meds: . antiseptic oral rinse  15 mL Mouth Rinse q12n4p  . [START ON 09/09/2013]  ceFAZolin (ANCEF) IV  2 g Intravenous 60 min Pre-Op  . [START ON 09/09/2013] chlorhexidine  1 application Topical Once  . chlorhexidine  1 application Topical Once  . chlorhexidine  15 mL Mouth Rinse BID  . enoxaparin (LOVENOX) injection  40 mg Subcutaneous Q24H  . insulin aspart  0-9 Units Subcutaneous Q6H  . lip balm  1 application Topical BID  . [START ON 09/09/2013] metronidazole  500 mg Intravenous On Call to OR  . nicotine  14 mg Transdermal Daily  . pantoprazole (PROTONIX) IV  40 mg Intravenous Q12H  . sodium chloride  10-40 mL Intracatheter Q12H   Continuous Infusions: . Marland KitchenTPN (CLINIMIX-E) Adult 75 mL/hr at 09/07/13 1741   And  . fat emulsion 250 mL (09/07/13 1741)  . Marland KitchenTPN (CLINIMIX-E) Adult     And  . fat emulsion    . sodium chloride 0.9 % 1,000 mL with potassium chloride 40 mEq infusion 20 mL/hr at 09/07/13 2006    Principal Problem:   Gastric cancer Active Problems:   GERD (gastroesophageal reflux disease)   Weight loss, unintentional   Hyponatremia   Dehydration   Tobacco abuse   COPD (chronic obstructive pulmonary disease)   Pedal edema   Protein-calorie malnutrition, severe   Gastric outlet obstruction   Ascites  Time spent: 15  Pamella Pert, MD [facility] Pager [phone]. If 7 PM - 7 AM, please contact night-coverage at [email or web address], password [facility] 09/08/2013, 10:42 AM  LOS: 17 days

## 2013-09-09 ENCOUNTER — Encounter (HOSPITAL_COMMUNITY): Payer: Federal, State, Local not specified - PPO | Admitting: Anesthesiology

## 2013-09-09 ENCOUNTER — Encounter (HOSPITAL_COMMUNITY)
Admission: EM | Disposition: A | Discharge status: Home Health | Payer: Self-pay | Source: Home / Self Care | Attending: Internal Medicine

## 2013-09-09 ENCOUNTER — Inpatient Hospital Stay (HOSPITAL_COMMUNITY): Payer: Federal, State, Local not specified - PPO | Admitting: Anesthesiology

## 2013-09-09 HISTORY — PX: JEJUNOSTOMY: SHX313

## 2013-09-09 LAB — COMPREHENSIVE METABOLIC PANEL
ALT: 204 U/L — ABNORMAL HIGH (ref 0–53)
AST: 105 U/L — ABNORMAL HIGH (ref 0–37)
Albumin: 1.7 g/dL — ABNORMAL LOW (ref 3.5–5.2)
Alkaline Phosphatase: 964 U/L — ABNORMAL HIGH (ref 39–117)
BUN: 18 mg/dL (ref 6–23)
Chloride: 103 mEq/L (ref 96–112)
Creatinine, Ser: 0.67 mg/dL (ref 0.50–1.35)
GFR calc Af Amer: >90 mL/min (ref >90)
GFR calc non Af Amer: >90 mL/min (ref >90)
Glucose, Bld: 116 mg/dL — ABNORMAL HIGH (ref 70–99)
Total Bilirubin: 3.4 mg/dL — ABNORMAL HIGH (ref 0.3–1.2)
Total Protein: 4.8 g/dL — ABNORMAL LOW (ref 6.0–8.3)

## 2013-09-09 LAB — CBC
HCT: 29.4 % — ABNORMAL LOW (ref 39.0–52.0)
MCH: 31.4 pg (ref 26.0–34.0)
MCHC: 33 g/dL (ref 30.0–36.0)
Platelets: 331 10*3/uL (ref 150–400)
RDW: 14.5 % (ref 11.5–15.5)

## 2013-09-09 LAB — GLUCOSE, CAPILLARY
Glucose-Capillary: 117 mg/dL — ABNORMAL HIGH (ref 70–99)
Glucose-Capillary: 121 mg/dL — ABNORMAL HIGH (ref 70–99)
Glucose-Capillary: 122 mg/dL — ABNORMAL HIGH (ref 70–99)

## 2013-09-09 LAB — DIFFERENTIAL
Basophils Absolute: 0.1 10*3/uL (ref 0.0–0.1)
Basophils Relative: 1 % (ref 0–1)
Eosinophils Absolute: 0.2 10*3/uL (ref 0.0–0.7)
Lymphocytes Relative: 18 % (ref 12–46)
Neutro Abs: 5.2 10*3/uL (ref 1.7–7.7)
Neutrophils Relative %: 69 % (ref 43–77)

## 2013-09-09 LAB — TYPE AND SCREEN: ABO/RH(D): A POS

## 2013-09-09 LAB — ABO/RH: ABO/RH(D): A POS

## 2013-09-09 LAB — TRIGLYCERIDES: Triglycerides: 76 mg/dL (ref <150)

## 2013-09-09 LAB — PREALBUMIN: Prealbumin: 13.2 mg/dL — ABNORMAL LOW (ref 17.0–34.0)

## 2013-09-09 LAB — PHOSPHORUS: Phosphorus: 4.3 mg/dL (ref 2.3–4.6)

## 2013-09-09 LAB — MAGNESIUM: Magnesium: 2 mg/dL (ref 1.5–2.5)

## 2013-09-09 SURGERY — CREATION, JEJUNOSTOMY
Anesthesia: General

## 2013-09-09 MED ORDER — TRACE MINERALS CR-CU-F-FE-I-MN-MO-SE-ZN IV SOLN
INTRAVENOUS | Status: AC
  Administered 2013-09-09: 18:00:00 via INTRAVENOUS
  Filled 2013-09-09: qty 2000

## 2013-09-09 MED ORDER — MORPHINE SULFATE 2 MG/ML IJ SOLN: 1.0000 mg | INTRAMUSCULAR | Status: DC | PRN

## 2013-09-09 MED ORDER — GLYCOPYRROLATE 0.2 MG/ML IJ SOLN
INTRAMUSCULAR | Status: AC
  Filled 2013-09-09: qty 3

## 2013-09-09 MED ORDER — FENTANYL CITRATE 0.05 MG/ML IJ SOLN
INTRAMUSCULAR | Status: DC | PRN
  Administered 2013-09-09 (×5): 50 ug via INTRAVENOUS

## 2013-09-09 MED ORDER — CISATRACURIUM BESYLATE 20 MG/10ML IV SOLN
INTRAVENOUS | Status: AC
  Filled 2013-09-09: qty 10

## 2013-09-09 MED ORDER — ONDANSETRON HCL 4 MG/2ML IJ SOLN
INTRAMUSCULAR | Status: AC
  Filled 2013-09-09: qty 2

## 2013-09-09 MED ORDER — BUPIVACAINE HCL (PF) 0.25 % IJ SOLN
INTRAMUSCULAR | Status: DC | PRN
  Administered 2013-09-09: 30 mL

## 2013-09-09 MED ORDER — BUPIVACAINE HCL (PF) 0.25 % IJ SOLN
INTRAMUSCULAR | Status: AC
  Filled 2013-09-09: qty 30

## 2013-09-09 MED ORDER — SUFENTANIL CITRATE 50 MCG/ML IV SOLN
INTRAVENOUS | Status: AC
  Filled 2013-09-09: qty 1

## 2013-09-09 MED ORDER — FENTANYL CITRATE 0.05 MG/ML IJ SOLN
INTRAMUSCULAR | Status: AC
  Filled 2013-09-09: qty 5

## 2013-09-09 MED ORDER — PROMETHAZINE HCL 25 MG/ML IJ SOLN: 6.2500 mg | INTRAMUSCULAR | Status: DC | PRN

## 2013-09-09 MED ORDER — GLYCOPYRROLATE 0.2 MG/ML IJ SOLN
INTRAMUSCULAR | Status: DC | PRN
  Administered 2013-09-09: .4 mg via INTRAVENOUS

## 2013-09-09 MED ORDER — MORPHINE SULFATE (PF) 1 MG/ML IV SOLN
INTRAVENOUS | Status: DC
  Administered 2013-09-09: 20:00:00 via INTRAVENOUS
  Administered 2013-09-10: 17 mg via INTRAVENOUS
  Administered 2013-09-10: 02:00:00 via INTRAVENOUS
  Administered 2013-09-10: 8 mg via INTRAVENOUS
  Filled 2013-09-09 (×2): qty 25

## 2013-09-09 MED ORDER — NEOSTIGMINE METHYLSULFATE 1 MG/ML IJ SOLN
INTRAMUSCULAR | Status: DC | PRN
  Administered 2013-09-09: 3 mg via INTRAVENOUS

## 2013-09-09 MED ORDER — LACTATED RINGERS IV SOLN: INTRAVENOUS | Status: DC

## 2013-09-09 MED ORDER — ONDANSETRON HCL 4 MG/2ML IJ SOLN: 4.0000 mg | Freq: Four times a day (QID) | INTRAMUSCULAR | Status: DC | PRN

## 2013-09-09 MED ORDER — PROPOFOL 10 MG/ML IV BOLUS
INTRAVENOUS | Status: AC
  Filled 2013-09-09: qty 20

## 2013-09-09 MED ORDER — DIPHENHYDRAMINE HCL 12.5 MG/5ML PO ELIX: 12.5000 mg | ORAL_SOLUTION | Freq: Four times a day (QID) | ORAL | Status: DC | PRN

## 2013-09-09 MED ORDER — HYDROMORPHONE HCL PF 1 MG/ML IJ SOLN
INTRAMUSCULAR | Status: AC
  Filled 2013-09-09: qty 1

## 2013-09-09 MED ORDER — HYDROMORPHONE HCL PF 1 MG/ML IJ SOLN
0.2500 mg | INTRAMUSCULAR | Status: DC | PRN
  Administered 2013-09-09 (×4): 0.5 mg via INTRAVENOUS

## 2013-09-09 MED ORDER — SUFENTANIL CITRATE 50 MCG/ML IV SOLN
INTRAVENOUS | Status: DC | PRN
  Administered 2013-09-09: 5 ug via INTRAVENOUS
  Administered 2013-09-09: 10 ug via INTRAVENOUS
  Administered 2013-09-09: 5 ug via INTRAVENOUS

## 2013-09-09 MED ORDER — LIDOCAINE HCL (CARDIAC) 20 MG/ML IV SOLN
INTRAVENOUS | Status: AC
  Filled 2013-09-09: qty 5

## 2013-09-09 MED ORDER — DIPHENHYDRAMINE HCL 50 MG/ML IJ SOLN: 12.5000 mg | Freq: Four times a day (QID) | INTRAMUSCULAR | Status: DC | PRN

## 2013-09-09 MED ORDER — PROPOFOL 10 MG/ML IV BOLUS
INTRAVENOUS | Status: DC | PRN
  Administered 2013-09-09: 150 mg via INTRAVENOUS

## 2013-09-09 MED ORDER — ONDANSETRON HCL 4 MG/2ML IJ SOLN
INTRAMUSCULAR | Status: DC | PRN
  Administered 2013-09-09: 4 mg via INTRAVENOUS

## 2013-09-09 MED ORDER — SUCCINYLCHOLINE CHLORIDE 20 MG/ML IJ SOLN
INTRAMUSCULAR | Status: AC
  Filled 2013-09-09: qty 1

## 2013-09-09 MED ORDER — LIDOCAINE HCL (CARDIAC) 20 MG/ML IV SOLN
INTRAVENOUS | Status: DC | PRN
  Administered 2013-09-09: 50 mg via INTRAVENOUS

## 2013-09-09 MED ORDER — SODIUM CHLORIDE 0.9 % IJ SOLN: 9.0000 mL | INTRAMUSCULAR | Status: DC | PRN

## 2013-09-09 MED ORDER — CISATRACURIUM BESYLATE (PF) 10 MG/5ML IV SOLN
INTRAVENOUS | Status: DC | PRN
  Administered 2013-09-09: 8 mg via INTRAVENOUS

## 2013-09-09 MED ORDER — FAT EMULSION 20 % IV EMUL
250.0000 mL | INTRAVENOUS | Status: AC
  Administered 2013-09-09: 250 mL via INTRAVENOUS
  Filled 2013-09-09: qty 250

## 2013-09-09 MED ORDER — NEOSTIGMINE METHYLSULFATE 1 MG/ML IJ SOLN
INTRAMUSCULAR | Status: AC
  Filled 2013-09-09: qty 10

## 2013-09-09 MED ORDER — LACTATED RINGERS IV SOLN
INTRAVENOUS | Status: DC | PRN
  Administered 2013-09-09: 14:00:00 via INTRAVENOUS

## 2013-09-09 MED ORDER — NALOXONE HCL 0.4 MG/ML IJ SOLN: 0.4000 mg | INTRAMUSCULAR | Status: DC | PRN

## 2013-09-09 MED ORDER — LACTATED RINGERS IV SOLN
INTRAVENOUS | Status: DC | PRN
  Administered 2013-09-09 (×2): via INTRAVENOUS

## 2013-09-09 MED ORDER — HYDROMORPHONE HCL PF 1 MG/ML IJ SOLN
1.0000 mg | INTRAMUSCULAR | Status: DC | PRN
  Administered 2013-09-10 (×2): 1 mg via INTRAVENOUS
  Filled 2013-09-09 (×3): qty 1

## 2013-09-09 MED ORDER — SODIUM CHLORIDE 0.9 % IR SOLN
Status: DC | PRN
  Administered 2013-09-09: 2000 mL

## 2013-09-09 MED ORDER — SUCCINYLCHOLINE CHLORIDE 20 MG/ML IJ SOLN
INTRAMUSCULAR | Status: DC | PRN
  Administered 2013-09-09: 120 mg via INTRAVENOUS

## 2013-09-09 MED ORDER — SODIUM CHLORIDE 0.9 % IV SOLN
INTRAVENOUS | Status: DC | PRN
  Administered 2013-09-09: 16:00:00 via INTRAVENOUS

## 2013-09-09 SURGICAL SUPPLY — 48 items
ADAPTER CATH WHT DISP STRL (CATHETERS) ×1 IMPLANT
ADPR CATH MP STRL LF DISP BD (CATHETERS) ×1
ADPR CATH UNV TPR FL F LL (CATHETERS) ×1
APPLICATOR COTTON TIP 6IN STRL (MISCELLANEOUS) ×1 IMPLANT
BLADE EXTENDED COATED 6.5IN (ELECTRODE) IMPLANT
BLADE HEX COATED 2.75 (ELECTRODE) ×2 IMPLANT
CANISTER SUCTION 2500CC (MISCELLANEOUS) ×2 IMPLANT
CATH MALECOT 086024 (CATHETERS) ×1 IMPLANT
CATH ROBINSON RED A/P 20FR (CATHETERS) ×1 IMPLANT
CONNECTOR CATH FOLEY FEMALE LL (CATHETERS) ×1 IMPLANT
COVER MAYO STAND STRL (DRAPES) IMPLANT
DRAPE LAPAROSCOPIC ABDOMINAL (DRAPES) ×2 IMPLANT
DRAPE WARM FLUID 44X44 (DRAPE) ×1 IMPLANT
ELECT REM PT RETURN 9FT ADLT (ELECTROSURGICAL) ×2
ELECTRODE REM PT RTRN 9FT ADLT (ELECTROSURGICAL) ×1 IMPLANT
GLOVE BIOGEL PI IND STRL 7.0 (GLOVE) ×1 IMPLANT
GLOVE BIOGEL PI INDICATOR 7.0 (GLOVE) ×1
GLOVE SURG SIGNA 7.5 PF LTX (GLOVE) ×2 IMPLANT
GOWN PREVENTION PLUS LG XLONG (DISPOSABLE) ×2 IMPLANT
GOWN STRL REIN XL XLG (GOWN DISPOSABLE) ×4 IMPLANT
KIT BASIN OR (CUSTOM PROCEDURE TRAY) ×2 IMPLANT
NEEDLE HYPO 22GX1.5 SAFETY (NEEDLE) ×1 IMPLANT
NS IRRIG 1000ML POUR BTL (IV SOLUTION) ×3 IMPLANT
PACK GENERAL/GYN (CUSTOM PROCEDURE TRAY) ×2 IMPLANT
PLUG CATH AND CAP STER (CATHETERS) ×1 IMPLANT
SPONGE GAUZE 4X4 12PLY (GAUZE/BANDAGES/DRESSINGS) ×2 IMPLANT
SPONGE LAP 18X18 X RAY DECT (DISPOSABLE) ×1 IMPLANT
STAPLER VISISTAT 35W (STAPLE) ×2 IMPLANT
SUCTION POOLE TIP (SUCTIONS) IMPLANT
SUT ETHILON 2 0 PS N (SUTURE) ×2 IMPLANT
SUT NOVA NAB GS-21 1 T12 (SUTURE) ×2 IMPLANT
SUT PDS AB 1 CTX 36 (SUTURE) IMPLANT
SUT SILK 2 0 (SUTURE)
SUT SILK 2 0 SH CR/8 (SUTURE) ×1 IMPLANT
SUT SILK 2-0 18XBRD TIE 12 (SUTURE) IMPLANT
SUT SILK 3 0 (SUTURE)
SUT SILK 3 0 SH CR/8 (SUTURE) ×1 IMPLANT
SUT SILK 3-0 18XBRD TIE 12 (SUTURE) IMPLANT
SUT VIC AB 2-0 SH 18 (SUTURE) ×3 IMPLANT
SUT VICRYL 2 0 18  UND BR (SUTURE)
SUT VICRYL 2 0 18 UND BR (SUTURE) IMPLANT
SYR 20CC LL (SYRINGE) ×1 IMPLANT
SYR CONTROL 10ML LL (SYRINGE) ×1 IMPLANT
TAPE CLOTH SURG 4X10 WHT LF (GAUZE/BANDAGES/DRESSINGS) ×1 IMPLANT
TOWEL OR 17X26 10 PK STRL BLUE (TOWEL DISPOSABLE) ×3 IMPLANT
TRAY FOLEY CATH 14FRSI W/METER (CATHETERS) ×1 IMPLANT
WATER STERILE IRR 1500ML POUR (IV SOLUTION) ×1 IMPLANT
YANKAUER SUCT BULB TIP NO VENT (SUCTIONS) ×1 IMPLANT

## 2013-09-09 NOTE — Anesthesia Preprocedure Evaluation (Signed)
Anesthesia Evaluation  Patient identified by MRN, date of birth, ID band Patient awake    Reviewed: Allergy & Precautions, H&P , NPO status , Patient's Chart, lab work & pertinent test results  Airway Mallampati: II TM Distance: >3 FB Neck ROM: Full    Dental no notable dental hx. (+) Edentulous Upper and Edentulous Lower   Pulmonary COPDCurrent Smoker,  breath sounds clear to auscultation  Pulmonary exam normal       Cardiovascular hypertension, Pt. on medications Rhythm:Regular Rate:Normal     Neuro/Psych negative neurological ROS  negative psych ROS   GI/Hepatic Neg liver ROS, GERD-  Poorly Controlled,Elevated LFTs    Endo/Other  negative endocrine ROS  Renal/GU negative Renal ROS  negative genitourinary   Musculoskeletal negative musculoskeletal ROS (+)   Abdominal   Peds  Hematology negative hematology ROS (+)   Anesthesia Other Findings   Reproductive/Obstetrics                           Anesthesia Physical Anesthesia Plan  ASA: III  Anesthesia Plan: General   Post-op Pain Management:    Induction: Intravenous, Rapid sequence and Cricoid pressure planned  Airway Management Planned: Oral ETT  Additional Equipment:   Intra-op Plan:   Post-operative Plan: Extubation in OR  Informed Consent: I have reviewed the patients History and Physical, chart, labs and discussed the procedure including the risks, benefits and alternatives for the proposed anesthesia with the patient or authorized representative who has indicated his/her understanding and acceptance.   Dental advisory given  Plan Discussed with: CRNA  Anesthesia Plan Comments:         Anesthesia Quick Evaluation

## 2013-09-09 NOTE — Preoperative (Addendum)
Beta Blockers   Reason not to administer Beta Blockers:Not Applicable 

## 2013-09-09 NOTE — Transfer of Care (Signed)
Immediate Anesthesia Transfer of Care Note  Patient: Randy Le  Procedure(s) Performed: Procedure(s): JEJUNOSTOMY (N/A) GASTROSTOMY TUBE (N/A)  Patient Location: PACU  Anesthesia Type:General  Level of Consciousness: awake, oriented, patient cooperative and responds to stimulation  Airway & Oxygen Therapy: Patient Spontanous Breathing and Patient connected to face mask oxygen  Post-op Assessment: Report given to PACU RN, Post -op Vital signs reviewed and stable and Patient moving all extremities  Post vital signs: Reviewed and stable  Complications: No apparent anesthesia complications

## 2013-09-09 NOTE — Progress Notes (Signed)
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  No Known Allergies  Patient Measurements: Height: 5\' 8"  (172.7 cm) Weight: 130 lb 3.2 oz (59.058 kg) IBW/kg (Calculated) : 68.4  Vital Signs: Temp: 98 F (36.7 C) (12/21 2145) Temp src: Oral (12/21 2145) BP: 108/58 mmHg (12/21 2145) Pulse Rate: 77 (12/21 2145)  Intake/Output from previous day: 12/21 0701 - 12/22 0700 In: 2520 [I.V.:480; TPN:2040] Out: 2775 [Urine:1650; Emesis/NG output:1125]  Labs:  Recent Labs  09/09/13 0406  WBC 7.6  HGB 9.7*  HCT 29.4*  PLT 331    Recent Labs  09/07/13 0610 09/08/13 0440 09/09/13 0406  NA 136 138 138  K 3.9 3.8 3.9  CL 102 106 103  CO2 27 29 28   GLUCOSE 117* 115* 116*  BUN 16 18 18   CREATININE 0.66 0.67 0.67  CALCIUM 8.7 8.7 8.6  MG  --   --  2.0  PHOS  --   --  4.3  PROT 5.0* 4.7* 4.8*  ALBUMIN 1.7* 1.7* 1.7*  AST 113* 102* 105*  ALT 199* 185* 204*  ALKPHOS 1004* 914* 964*  BILITOT 2.0* 2.5* 3.4*  TRIG  --   --  76  12/20: Ca corrects to 10.5 Estimated Creatinine Clearance: 84.1 ml/min (by C-G formula based on Cr of 0.67).   Recent Labs  09/08/13 1755 09/08/13 2359 09/09/13 0558  GLUCAP 114* 127* 123*   Insulin Requirements in the past 24 hours:  2 units Novolog, on sensitive SSI q6hr   Nutritional Goals:  Re-estimated needs from 12/9 RD note: Kcal: 1770-2065  Protein: 88-106g Fluid: daily Clinimix E 5/15 at 75ml/hr + 20% fat emulsion at 65ml/hr will provide 90 grams protein and 1758 kcal/day.  Current Nutrition: NPO Continuous TNA Clinimix E 5/15 at 75 mL/hr + 20% fat emulsion at 10 mL/hr MIVF: NS + KCl 40 mEq/L at 20 ml/hr  Assessment: 57yo M with gastric outlet obstruction due to gastric mass.  Biopsy showed gastric carcinoma, diffuse type. Unable to place post-pyloric tube for enteral feedings; parenteral nutrition started 12/5.  12/11: TNA changed to cyclic in case LFT  elevation was due to cont dextrose infusion. 12/14: LFTs improved (except Alk phos), there are other possible causes for LFT elevation 12/14: switched back to continuous TNA due to some hypoglycemia in the 6 hours TNA was off each day 12/16: NGT replaced 12/15- continues with 1.1L NGT output past 24h, MD to recheck pre-albumin on 12/18 with possible surgery 12/18 or 12/19 12/19-21: Bilirubin increased, AST/ALT 3x ULN, Alk Phos 8xULN. Likely multi-factorial with TNA (elevation occurred more rapidly than usual), possible obstruction, previous hx of ETOH use, remote possibility liver mets (none seen on CT 11/25) 12/22: Surgery today, with J tube placement. Continues with > 1L NGT output daily. Pt will need TNA post-surgery until bowel function returns and pt is able to tolerate enteral feeding per discussion with Surgery PA-C this AM  Labs:    Electrolytes: WNL with KCl in maintenance IVF.  Renal: SCr wnl and stable.  LFT's: transaminases, TBili, and alk phos elevated  Glucose: CBGs at goal (< 150)    TG: 76 (12/22), 74 (12/15), 61(12/8)  Pre-albumin: 13.8 (12/18), 11.0 (12/15), 9.7(12/11)  Plan:   Continue Clinimix E 5/15 at 44ml/hr   Continue 20% Fat Emulsion at 46ml/hr  TNA to contain standard MVI and trace elements daily.  Continue NS with KCl 40 mEq at 20 ml/hr to deliver ~20 mEq KCl over 24h  Noted plans for surgery today with plan J tube placement for enteral feed when able to tolerate  TNA labs on Mondays and Thursdays  Continue sensitive-scale SSI q6hr  CMET in AM, following LFT's  Thank you for the consult.  Tomi Bamberger, PharmD, BCPS Clinical Pharmacist Pager: [phone] Pharmacy: [phone] 09/09/2013 8:39 AM

## 2013-09-09 NOTE — Anesthesia Procedure Notes (Signed)
Procedure Name: Intubation Date/Time: 09/09/2013 2:36 PM Performed by: Edison Pace Pre-anesthesia Checklist: Patient identified, Patient being monitored, Emergency Drugs available, Timeout performed and Suction available Patient Re-evaluated:Patient Re-evaluated prior to inductionOxygen Delivery Method: Circle system utilized Preoxygenation: Pre-oxygenation with 100% oxygen Intubation Type: IV induction, Cricoid Pressure applied and Rapid sequence Laryngoscope Size: Mac and 4 Grade View: Grade I Tube type: Oral Number of attempts: 1 Airway Equipment and Method: Stylet Placement Confirmation: ETT inserted through vocal cords under direct vision,  positive ETCO2 and breath sounds checked- equal and bilateral Secured at: 21 cm Tube secured with: Tape Dental Injury: Teeth and Oropharynx as per pre-operative assessment

## 2013-09-09 NOTE — Progress Notes (Signed)
PROGRESS NOTE  Randy Le ZOX:[id number] DOB: [birth year] DOA: 08/22/2013 PCP: Kirk Ruths, MD  Interval history  57 yo M with HTN, COPD, tobacco abuse, presented to [facility] on 08/22/2013 with weight loss of 25 lbs over 5 weeks, poor po intake, abdominal pain and nausea. CT with gastric antral wall thickening. EGD done 08/23/2013 with findings of gastric outlet obstruction, linitis plastica or gastric lymphoma. Biopsy was non-confirmatory of carcinoma and underwent EUS 08/29/2013 with biopsy showing highly atypical malignant cells c/w diffuse type gastric carcinoma. Surgery and oncology were consulted and patient will undergo surgery 12/22 or 12/23 for resection vs bypass, meanwhile patient NPO, with NG tube, on TPN given poor nutritional status.   Assessment/Plan: Abdominal pain secondary to gastric outlet obstruction due to gastric carcinoma per biopsy. NPO for now, TPN, NG tube. Patient with improving nutritional status. - to undergo surgery today.  - Continue current pain management  Cough  - Likely related to possible aspiration pneumonia, now significantly improved  - Patient was started on Zosyn 08/23/2013 and completed a 7 day treatment  - Continue Albuterol nebulizer every 4-6 hours PRN  Abnormal liver function tests  - worsening after TPN, per surgery to place J arm during surgery for enteral feeding.  - closely monitored. Bilateral Lower Extremity Edema  - Likely related to malnutrition and hypoalbuminemia.  - much improved.  Hyponatremia  - Related to dehydration.  - Resolved.   Diet: NPO Fluids: TPN DVT Prophylaxis: Lovenox  Code Status: Full Family Communication: wife bedside Disposition Plan: inpatient  Consultants:  Surgery  GI  Oncology  Procedures:  EGD 12/5  EUS 12/11   Antibiotics Zosyn 08/23/2013 --> 08/30/2013  HPI/Subjective: - awaiting surgery  Objective: Filed Vitals:   09/08/13 0457 09/08/13 1320 09/08/13 2145 09/09/13 1239    BP: 131/66 120/71 108/58 111/58  Pulse: 72 77 77 70  Temp: 97.7 F (36.5 C) 98 F (36.7 C) 98 F (36.7 C) 98.2 F (36.8 C)  TempSrc: Oral Oral Oral Oral  Resp: 20 18 18 16   Height:      Weight:      SpO2: 97% 100% 99%     Intake/Output Summary (Last 24 hours) at 09/09/13 1301 Last data filed at 09/09/13 0600  Gross per 24 hour  Intake   2520 ml  Output   2175 ml  Net    345 ml   Filed Weights   08/22/13 1600 08/22/13 2307  Weight: 55.792 kg (123 lb) 59.058 kg (130 lb 3.2 oz)   Exam:  General:  NAD  Cardiovascular: regular rate and rhythm, without MRG  Respiratory: good air movement, clear to auscultation throughout, no wheezing, ronchi or rales  Abdomen: soft, mildly tender to palpation, positive bowel sounds; NG tube in place  MSK: no peripheral edema  Neuro: non focal  Data Reviewed: Basic Metabolic Panel:  Recent Labs Lab 09/05/13 0530 09/06/13 0515 09/07/13 0610 09/08/13 0440 09/09/13 0406  NA 135 137 136 138 138  K 3.7 3.9 3.9 3.8 3.9  CL 101 104 102 106 103  CO2 26 27 27 29 28   GLUCOSE 125* 119* 117* 115* 116*  BUN 15 16 16 18 18   CREATININE 0.60 0.64 0.66 0.67 0.67  CALCIUM 8.5 8.6 8.7 8.7 8.6  MG 2.0  --   --   --  2.0  PHOS 3.7  --   --   --  4.3   Liver Function Tests:  Recent Labs Lab 09/05/13 0530 09/06/13 0515 09/07/13  1191 09/08/13 0440 09/09/13 0406  AST 111* 113* 113* 102* 105*  ALT 169* 165* 199* 185* 204*  ALKPHOS 896* 920* 1004* 914* 964*  BILITOT 1.1 1.9* 2.0* 2.5* 3.4*  PROT 4.8* 4.7* 5.0* 4.7* 4.8*  ALBUMIN 1.7* 1.7* 1.7* 1.7* 1.7*   CBC:  Recent Labs Lab 09/05/13 0530 09/09/13 0406  WBC 7.5 7.6  NEUTROABS  --  5.2  HGB 9.7* 9.7*  HCT 29.0* 29.4*  MCV 95.1 95.1  PLT 386 331   BNP (last 3 results)  Recent Labs  08/22/13 1727  PROBNP 113.8   CBG:  Recent Labs Lab 09/08/13 1144 09/08/13 1755 09/08/13 2359 09/09/13 0558 09/09/13 1208  GLUCAP 125* 114* 127* 123* 122*   Studies: No results  found.  Scheduled Meds: . [facility] HOLD] antiseptic oral rinse  15 mL Mouth Rinse q12n4p  . [MAR HOLD]  ceFAZolin (ANCEF) IV  2 g Intravenous 60 min Pre-Op  . [facility] HOLD] chlorhexidine  15 mL Mouth Rinse BID  . [MAR HOLD] enoxaparin (LOVENOX) injection  40 mg Subcutaneous Q24H  . [MAR HOLD] insulin aspart  0-9 Units Subcutaneous Q6H  . [MAR HOLD] lip balm  1 application Topical BID  . [MAR HOLD] metronidazole  500 mg Intravenous On Call to OR  . [facility] HOLD] nicotine  14 mg Transdermal Daily  . [MAR HOLD] pantoprazole (PROTONIX) IV  40 mg Intravenous Q12H  . [facility] HOLD] sodium chloride  10-40 mL Intracatheter Q12H   Continuous Infusions: . [MAR HOLD] .TPN (CLINIMIX-E) Adult 75 mL/hr at 09/08/13 1729   And  . [facility] HOLD] fat emulsion 250 mL (09/08/13 1729)  . [MAR HOLD] .TPN (CLINIMIX-E) Adult     And  . [MAR HOLD] fat emulsion    . sodium chloride 0.9 % 1,000 mL with potassium chloride 40 mEq infusion 20 mL/hr at 09/07/13 2006    Principal Problem:   Gastric cancer Active Problems:   GERD (gastroesophageal reflux disease)   Weight loss, unintentional   Hyponatremia   Dehydration   Tobacco abuse   COPD (chronic obstructive pulmonary disease)   Pedal edema   Protein-calorie malnutrition, severe   Gastric outlet obstruction   Ascites  Time spent: 15  Pamella Pert, MD [facility] Pager [phone]. If 7 PM - 7 AM, please contact night-coverage at [email or web address], password [facility] 09/09/2013, 1:01 PM  LOS: 18 days

## 2013-09-09 NOTE — Brief Op Note (Signed)
08/22/2013 - 09/09/2013  4:21 PM  PATIENT:  Randy Le, 57 y.o., male, MRN: [id number]  PRE-OPERATIVE DIAGNOSIS:  Gastric outlet obstruction secondary to gastric cancer  POST-OPERATIVE DIAGNOSIS:  Gastric outlet obstruction secondary to gastric cancer, extensive tumor involving distal 1/3 of stomach, transverse colon, mesentery of transverse colon/small bowel, retroperitoneum, drop mets in pelvic cul de sac  PROCEDURE:  Procedure(s): Open abdominal exploration,  #24 Malecot gastrostomy tube, #20 red rubber jejunostomy tube.  SURGEON:   Ovidio Kin, M.D.  ASSISTANT:   Barnetta Chapel, PA  ANESTHESIA:   general  CRNA: Florene Route, CRNA  General  EBL:  minimal  Ml, but he had 2600 cc of ascites.  BLOOD ADMINISTERED: none   DRAINS: none   LOCAL MEDICATIONS USED:   30 cc 1/4% marcaine  SPECIMEN:   Biopsy of greater curvature of the stomach  COUNTS CORRECT:  YES  INDICATIONS FOR PROCEDURE:  Randy Le is a 57 y.o. (DOB: 18-Jan-1956) white  male whose primary care physician is Kirk Ruths, MD.  He was admitted with a gastric outlet obstruction.  The was found to be secondary to a diffuse type gastric carcinoma on biopsies by Dr. Kennedy Bucker.   The indications and risks of the surgery were explained to the patient.  The risks include, but are not limited to, infection, bleeding, and nerve injury.  Note dictated to:   #[id number]    Ovidio Kin, MD, [facility] Surgery Pager: [phone] Office phone:  [phone]

## 2013-09-09 NOTE — Transfer of Care (Signed)
Immediate Anesthesia Transfer of Care Note  Patient: Randy Le  Procedure(s) Performed: Procedure(s): JEJUNOSTOMY (N/A) GASTROSTOMY TUBE (N/A)  Patient Location: PACU  Anesthesia Type:General  Level of Consciousness: awake, oriented, patient cooperative, lethargic and responds to stimulation  Airway & Oxygen Therapy: Patient Spontanous Breathing and Patient connected to face mask oxygen  Post-op Assessment: Report given to PACU RN, Post -op Vital signs reviewed and stable and Patient moving all extremities  Post vital signs: Reviewed and stable  Complications: No apparent anesthesia complications

## 2013-09-09 NOTE — Progress Notes (Signed)
Patient ID: Randy Le, male   DOB: 04/09/1956, 57 y.o.   MRN: [id number] 11 Days Post-Op  Subjective: Pt feels ok.  Ready to proceed with surgery  Objective: Vital signs in last 24 hours: Temp:  [98 F (36.7 C)] 98 F (36.7 C) (12/21 2145) Pulse Rate:  [77] 77 (12/21 2145) Resp:  [18] 18 (12/21 2145) BP: (108-120)/(58-71) 108/58 mmHg (12/21 2145) SpO2:  [99 %-100 %] 99 % (12/21 2145) Last BM Date: 09/06/13  Intake/Output from previous day: 12/21 0701 - 12/22 0700 In: 2520 [I.V.:480; TPN:2040] Out: 2775 [Urine:1650; Emesis/NG output:1125] Intake/Output this shift:    PE: Abd: stable, no change  Lab Results:   Recent Labs  09/09/13 0406  WBC 7.6  HGB 9.7*  HCT 29.4*  PLT 331   BMET  Recent Labs  09/08/13 0440 09/09/13 0406  NA 138 138  K 3.8 3.9  CL 106 103  CO2 29 28  GLUCOSE 115* 116*  BUN 18 18  CREATININE 0.67 0.67  CALCIUM 8.7 8.6   PT/INR No results found for this basename: LABPROT, INR,  in the last 72 hours CMP     Component Value Date/Time   NA 138 09/09/2013 0406   K 3.9 09/09/2013 0406   CL 103 09/09/2013 0406   CO2 28 09/09/2013 0406   GLUCOSE 116* 09/09/2013 0406   BUN 18 09/09/2013 0406   CREATININE 0.67 09/09/2013 0406   CALCIUM 8.6 09/09/2013 0406   PROT 4.8* 09/09/2013 0406   ALBUMIN 1.7* 09/09/2013 0406   AST 105* 09/09/2013 0406   ALT 204* 09/09/2013 0406   ALKPHOS 964* 09/09/2013 0406   BILITOT 3.4* 09/09/2013 0406   GFRNONAA >90 09/09/2013 0406   GFRAA >90 09/09/2013 0406   Lipase     Component Value Date/Time   LIPASE 61* 08/22/2013 1727       Studies/Results: No results found.  Anti-infectives: Anti-infectives   Start     Dose/Rate Route Frequency Ordered Stop   09/09/13 0600  metroNIDAZOLE (FLAGYL) IVPB 500 mg    Comments:  Pharmacy may adjust dosing strength, interval, or rate of medication as needed for optimal therapy for the patient Send with patient on call to the OR.  Anesthesia to complete  antibiotic administration <11min prior to incision per [facility].   500 mg 100 mL/hr over 60 Minutes Intravenous On call to O.R. 09/07/13 0756 09/10/13 0559   09/09/13 0000  ceFAZolin (ANCEF) IVPB 2 g/50 mL premix     2 g 100 mL/hr over 30 Minutes Intravenous 60 min pre-op 09/07/13 0756     08/23/13 1400  piperacillin-tazobactam (ZOSYN) IVPB 3.375 g  Status:  Discontinued     3.375 g 12.5 mL/hr over 240 Minutes Intravenous 3 times per day 08/23/13 1247 08/30/13 1038       Assessment/Plan  1. Gastric cancer, diffuse type with GOO 2. PCM/TNA 3. Elevated LFTs  Plan: 1. Plan is to proceed with surgery this afternoon. 2. Cont TNA for now until we can transition to enteral feeds after surgery 3. Patient and wife in the room and aware of plan for today.  LOS: 18 days    OSBORNE,KELLY E 09/09/2013, 8:34 AM Pager: [phone]  Agree with above. I have had lengthy discussions with the patient and his wife.  Ovidio Kin, MD, [facility] Surgery Pager: [phone] Office phone:  [phone]

## 2013-09-10 ENCOUNTER — Encounter (HOSPITAL_COMMUNITY): Payer: Self-pay | Admitting: Surgery

## 2013-09-10 LAB — GLUCOSE, CAPILLARY
Glucose-Capillary: 118 mg/dL — ABNORMAL HIGH (ref 70–99)
Glucose-Capillary: 176 mg/dL — ABNORMAL HIGH (ref 70–99)
Glucose-Capillary: 116 mg/dL — ABNORMAL HIGH (ref 70–99)

## 2013-09-10 LAB — HEPATIC FUNCTION PANEL
ALT: 183 U/L — ABNORMAL HIGH (ref 0–53)
AST: 87 U/L — ABNORMAL HIGH (ref 0–37)
Bilirubin, Direct: 3.9 mg/dL — ABNORMAL HIGH (ref 0.0–0.3)
Total Protein: 4.4 g/dL — ABNORMAL LOW (ref 6.0–8.3)

## 2013-09-10 LAB — BASIC METABOLIC PANEL
BUN: 17 mg/dL (ref 6–23)
Calcium: 8.3 mg/dL — ABNORMAL LOW (ref 8.4–10.5)
Chloride: 104 mEq/L (ref 96–112)
GFR calc Af Amer: >90 mL/min (ref >90)
Potassium: 3.9 mEq/L (ref 3.5–5.1)
Sodium: 136 mEq/L (ref 135–145)

## 2013-09-10 LAB — CBC
HCT: 29.3 % — ABNORMAL LOW (ref 39.0–52.0)
MCHC: 33.1 g/dL (ref 30.0–36.0)
RBC: 3.07 MIL/uL — ABNORMAL LOW (ref 4.22–5.81)
RDW: 14.7 % (ref 11.5–15.5)
WBC: 8.5 10*3/uL (ref 4.0–10.5)

## 2013-09-10 MED ORDER — SODIUM CHLORIDE 0.9 % IJ SOLN: 9.0000 mL | INTRAMUSCULAR | Status: DC | PRN

## 2013-09-10 MED ORDER — DIPHENHYDRAMINE HCL 50 MG/ML IJ SOLN: 12.5000 mg | Freq: Four times a day (QID) | INTRAMUSCULAR | Status: DC | PRN

## 2013-09-10 MED ORDER — ONDANSETRON HCL 4 MG/2ML IJ SOLN: 4.0000 mg | Freq: Four times a day (QID) | INTRAMUSCULAR | Status: DC | PRN

## 2013-09-10 MED ORDER — FAT EMULSION 20 % IV EMUL
250.0000 mL | INTRAVENOUS | Status: AC
  Administered 2013-09-10: 250 mL via INTRAVENOUS
  Filled 2013-09-10: qty 250

## 2013-09-10 MED ORDER — HYDROMORPHONE 0.3 MG/ML IV SOLN
INTRAVENOUS | Status: DC
  Administered 2013-09-10 (×2): via INTRAVENOUS
  Administered 2013-09-10: 6.9 mg via INTRAVENOUS
  Filled 2013-09-10 (×2): qty 25

## 2013-09-10 MED ORDER — DIPHENHYDRAMINE HCL 12.5 MG/5ML PO ELIX: 12.5000 mg | ORAL_SOLUTION | Freq: Four times a day (QID) | ORAL | Status: DC | PRN

## 2013-09-10 MED ORDER — HYDROMORPHONE 0.3 MG/ML IV SOLN
INTRAVENOUS | Status: DC
  Administered 2013-09-10: 23:00:00 via INTRAVENOUS
  Administered 2013-09-10: 6.84 mg via INTRAVENOUS
  Administered 2013-09-11: 10.49 mg via INTRAVENOUS
  Administered 2013-09-11: 05:00:00 via INTRAVENOUS
  Administered 2013-09-11: 3.99 mg via INTRAVENOUS
  Administered 2013-09-11: 3.49 mg via INTRAVENOUS
  Administered 2013-09-11: 22:00:00 via INTRAVENOUS
  Administered 2013-09-11 (×2): 4.5 mg via INTRAVENOUS
  Administered 2013-09-11: 3 mg via INTRAVENOUS
  Administered 2013-09-11: 17:00:00 via INTRAVENOUS
  Administered 2013-09-12: 4.49 mg via INTRAVENOUS
  Administered 2013-09-12: 4.29 mg via INTRAVENOUS
  Administered 2013-09-12 (×3): via INTRAVENOUS
  Administered 2013-09-12: 5.2 mg via INTRAVENOUS
  Administered 2013-09-12: 4.49 mg via INTRAVENOUS
  Administered 2013-09-12: 10.49 mg via INTRAVENOUS
  Administered 2013-09-12: 10:00:00 via INTRAVENOUS
  Administered 2013-09-12: 5.49 mg via INTRAVENOUS
  Administered 2013-09-12: 3.99 mg via INTRAVENOUS
  Administered 2013-09-13: 3.39 mg via INTRAVENOUS
  Administered 2013-09-13: 14:00:00 via INTRAVENOUS
  Administered 2013-09-13: 4.84 mg via INTRAVENOUS
  Administered 2013-09-13: 06:00:00 via INTRAVENOUS
  Administered 2013-09-13: 7.19 mg via INTRAVENOUS
  Administered 2013-09-13 – 2013-09-14 (×2): via INTRAVENOUS
  Administered 2013-09-14: 3.63 mg via INTRAVENOUS
  Administered 2013-09-14: 4.49 mg via INTRAVENOUS
  Administered 2013-09-14 (×3): via INTRAVENOUS
  Administered 2013-09-14: 3.99 mg via INTRAVENOUS
  Administered 2013-09-14: 9.49 mg via INTRAVENOUS
  Administered 2013-09-15: 1.49 mg via INTRAVENOUS
  Administered 2013-09-15: 10:00:00 via INTRAVENOUS
  Administered 2013-09-15: 1.49 mg via INTRAVENOUS
  Administered 2013-09-15: 7.25 mg via INTRAVENOUS
  Filled 2013-09-10 (×18): qty 25

## 2013-09-10 MED ORDER — HYDROMORPHONE HCL PF 1 MG/ML IJ SOLN
1.0000 mg | Freq: Once | INTRAMUSCULAR | Status: AC
  Administered 2013-09-10: 1 mg via INTRAVENOUS
  Filled 2013-09-10: qty 1

## 2013-09-10 MED ORDER — TRACE MINERALS CR-CU-F-FE-I-MN-MO-SE-ZN IV SOLN
INTRAVENOUS | Status: AC
  Administered 2013-09-10: 17:00:00 via INTRAVENOUS
  Filled 2013-09-10: qty 2000

## 2013-09-10 MED ORDER — NALOXONE HCL 0.4 MG/ML IJ SOLN: 0.4000 mg | INTRAMUSCULAR | Status: DC | PRN

## 2013-09-10 NOTE — Progress Notes (Signed)
Patient ID: Randy Le, male   DOB: 11-07-1955, 57 y.o.   MRN: [id number] 1 Day Post-Op  Subjective: Had a long d/w the patient about operative findings.  He c/o leakage from around his tubes today.  Objective: Vital signs in last 24 hours: Temp:  [97.5 F (36.4 C)-98.5 F (36.9 C)] 98.5 F (36.9 C) (12/23 0622) Pulse Rate:  [70-98] 89 (12/23 0622) Resp:  [14-29] 17 (12/23 0755) BP: (100-154)/(54-83) 123/62 mmHg (12/23 0622) SpO2:  [99 %-100 %] 99 % (12/23 0755) Last BM Date: 09/06/13  Intake/Output from previous day: 12/22 0701 - 12/23 0700 In: 3600 [I.V.:3600] Out: 3835 [Urine:800; Emesis/NG output:300; Drains:10; Blood:25] Intake/Output this shift:    PE: Abd: soft, +BS, G and J tube in place with ascitic leakage present.  Midline would is c/d/i with staples.  Lab Results:   Recent Labs  09/09/13 0406 09/10/13 0500  WBC 7.6 8.5  HGB 9.7* 9.7*  HCT 29.4* 29.3*  PLT 331 316   BMET  Recent Labs  09/09/13 0406 09/10/13 0500  NA 138 136  K 3.9 3.9  CL 103 104  CO2 28 27  GLUCOSE 116* 126*  BUN 18 17  CREATININE 0.67 0.63  CALCIUM 8.6 8.3*   PT/INR No results found for this basename: LABPROT, INR,  in the last 72 hours CMP     Component Value Date/Time   NA 136 09/10/2013 0500   K 3.9 09/10/2013 0500   CL 104 09/10/2013 0500   CO2 27 09/10/2013 0500   GLUCOSE 126* 09/10/2013 0500   BUN 17 09/10/2013 0500   CREATININE 0.63 09/10/2013 0500   CALCIUM 8.3* 09/10/2013 0500   PROT 4.4* 09/10/2013 0500   ALBUMIN 1.5* 09/10/2013 0500   AST 87* 09/10/2013 0500   ALT 183* 09/10/2013 0500   ALKPHOS 902* 09/10/2013 0500   BILITOT 4.5* 09/10/2013 0500   GFRNONAA >90 09/10/2013 0500   GFRAA >90 09/10/2013 0500   Lipase     Component Value Date/Time   LIPASE 61* 08/22/2013 1727       Studies/Results: No results found.  Anti-infectives: Anti-infectives   Start     Dose/Rate Route Frequency Ordered Stop   09/09/13 0600  [MAR Hold]  metroNIDAZOLE  (FLAGYL) IVPB 500 mg     (On MAR Hold since 09/09/13 1255)  Comments:  Pharmacy may adjust dosing strength, interval, or rate of medication as needed for optimal therapy for the patient Send with patient on call to the OR.  Anesthesia to complete antibiotic administration <52min prior to incision per [facility].   500 mg 100 mL/hr over 60 Minutes Intravenous On call to O.R. 09/07/13 0756 09/09/13 1430   09/09/13 0000  [MAR Hold]  ceFAZolin (ANCEF) IVPB 2 g/50 mL premix     (On MAR Hold since 09/09/13 1255)   2 g 100 mL/hr over 30 Minutes Intravenous 60 min pre-op 09/07/13 0756 09/09/13 1415   08/23/13 1400  piperacillin-tazobactam (ZOSYN) IVPB 3.375 g  Status:  Discontinued     3.375 g 12.5 mL/hr over 240 Minutes Intravenous 3 times per day 08/23/13 1247 08/30/13 1038       Assessment/Plan  1. POD1, s/p ex lap with placement of g and j tube 2. Gastric carcinoma, unresectable and unable to bypass 3. PCM/TNA  Plan: 1. Will dc NGT today and leave G-tube to gravity. 2. Will d/w MD is he is ok starting trickle J tube feeds today or wait until tomorrow. 3. Patient will need pain control  once off of PCA that does not require oral intake, ie fentanyl patch, SL morphine, or elixirs for J tube 4. Will try to get patient to goal rate of j tube feeds and then try to cycle him 5. Will need HH once stable for dc home   LOS: 19 days    Helen Winterhalter E 09/10/2013, 10:34 AM Pager: [phone]

## 2013-09-10 NOTE — Progress Notes (Addendum)
CHANGED ABDOMINAL DRESSING X 2 DURING THE NIGHT, SOAKED WITH SEROSANGUINOUS FLUID. FLUID COMING FROM AROUND G-TUBE AND J-TUBE. Mikeila Burgen,RN

## 2013-09-10 NOTE — Progress Notes (Signed)
NUTRITION FOLLOW UP  Intervention:   - TPN wean per pharmacy - Recommend Daily Weights - TF recommendations: Initiate Vital AF 1.2 @ 10 ml/hr via J tube and increase by 10 ml every 8 hours to goal rate of 60 ml/hr. At goal rate, tube feeding regimen will provide 1728 kcal, 108 grams of protein, and 1168 ml of free H2O. This will meet 95% of estimated energy needs and 103% of estimated protein needs. If pt is able to tolerate Vital AF 1.2 at goal rate without complications may consider transitioning pt to a standard TF such as Jevity 1.2 or Osmolite.  - Once continuous infusions discontinue; recommend additional 100 ml free water flushes 6 times daily to provide a total of 1768 ml of free water daily.  - RD to continue to monitor  Nutrition Dx:   Inadequate oral intake related to inability to eat, altered GI funtion as evidenced by NPO status, NG tube placed for gastric decompression; ongoing  Goal:   Pt to meet >/= 90% of their estimated nutrition needs; being met  Monitor:   Nutrition Support, Labs, Weights   Assessment:   57yo M with gastric outlet obstruction due to gastric mass. Biopsy showed gastric carcinoma, diffuse type. Unable to place post-pyloric tube for enteral feedings; parenteral nutrition started 12/5.  Clinimix 5/15 @ Clinimix E 5/15 at 75 mL/hr + 20% fat emulsion at 10 mL/hr. Regimen delivers 90 grams protein and 1758 kcal/day. This meets 97% of estimated energy needs and 100% of estimated protein needs. Pt's weight using the bed scale 12/16 was 119 lbs; pt states that when he came [facility] he weighed 123 lbs.   Pt now has G and J tube in place. RD consulted for TF management. Pt states he has not had anything to eat for 6 weeks now. He reports a 2 year history of diarrhea after having 2-3 feet of his small bowel removed 2 years ago. Recommend starting TF's using Vital AF 1.2 due to history of bowel surgery and diarrhea, and due to no PO intake for 6 weeks.   Blood glucose  ranging 117 to 176 mg/dL Low hemoglobin Potassium, magnesium, and phosphorus are WNL. Elevated AST, ALT, and Alk Phos Elevated bilirubin  Height: Ht Readings from Last 1 Encounters:  08/22/13 5\' 8"  (1.727 m)    Weight Status:   Wt Readings from Last 1 Encounters:  08/22/13 130 lb 3.2 oz (59.058 kg)    Re-estimated needs:  Kcal: 1820-2080  Protein: 90-105 grams Fluid: 1.8 L daily   Skin: Intact  Diet Order: NPO   Intake/Output Summary (Last 24 hours) at 09/10/13 1610 Last data filed at 09/10/13 1454  Gross per 24 hour  Intake   1600 ml  Output   1210 ml  Net    390 ml    Last BM: 09/04/13   Labs:   Recent Labs Lab 09/05/13 0530  09/08/13 0440 09/09/13 0406 09/10/13 0500  NA 135  < > 138 138 136  K 3.7  < > 3.8 3.9 3.9  CL 101  < > 106 103 104  CO2 26  < > 29 28 27   BUN 15  < > 18 18 17   CREATININE 0.60  < > 0.67 0.67 0.63  CALCIUM 8.5  < > 8.7 8.6 8.3*  MG 2.0  --   --  2.0  --   PHOS 3.7  --   --  4.3  --   GLUCOSE 125*  < >  115* 116* 126*  < > = values in this interval not displayed.  CBG (last 3)   Recent Labs  09/09/13 2347 09/10/13 0619 09/10/13 1152  GLUCAP 121* 118* 176*    Scheduled Meds: . antiseptic oral rinse  15 mL Mouth Rinse q12n4p  . chlorhexidine  15 mL Mouth Rinse BID  . enoxaparin (LOVENOX) injection  40 mg Subcutaneous Q24H  . HYDROmorphone PCA 0.3 mg/mL   Intravenous Q4H  . insulin aspart  0-9 Units Subcutaneous Q6H  . lip balm  1 application Topical BID  . nicotine  14 mg Transdermal Daily  . pantoprazole (PROTONIX) IV  40 mg Intravenous Q12H  . sodium chloride  10-40 mL Intracatheter Q12H    Continuous Infusions: . Marland KitchenTPN (CLINIMIX-E) Adult 75 mL/hr at 09/09/13 1822   And  . fat emulsion 250 mL (09/09/13 1822)  . Marland KitchenTPN (CLINIMIX-E) Adult     And  . fat emulsion    . sodium chloride 0.9 % 1,000 mL with potassium chloride 40 mEq infusion 20 mL/hr at 09/09/13 1849    Ian Malkin RD, LDN Inpatient Clinical  Dietitian Pager: [phone] After Hours Pager: [phone]

## 2013-09-10 NOTE — Progress Notes (Signed)
Agree with A&P of KO,PA. He is comfortable this afternoon and has spoken with Dr Truett Perna, medical oncologist, re dx and plans

## 2013-09-10 NOTE — Op Note (Signed)
NAMELANDERS, PRAJAPATI NO.:  [id number]  MEDICAL RECORD NO.:  [id number]  LOCATION:  1301                         FACILITY:  [facility]  PHYSICIAN:  Sandria Bales. Ezzard Standing, M.D.  DATE OF BIRTH:  19-Nov-1955  DATE OF PROCEDURE:  09/09/2013                               OPERATIVE REPORT   PREOPERATIVE DIAGNOSIS:  Gastric outlet obstruction secondary to diffuse type gastric carcinoma.  POSTOPERATIVE DIAGNOSIS:  Gastric outlet obstruction secondary to diffuse type gastric carcinoma, extensive tumor invading the distal one-third of the stomach, the transverse colon, the mesentery of the transverse colon, and the proximal small bowel mesentery into the retroperitoneum, and drop metastasis in the pelvis.  He had 2600 mL of ascites.  PROCEDURES:  Abdominal exploration with placement of a 24 Malecot gastrostomy tube and #20 red rubber jejunostomy tube.  Biopsy of greater curvature of stomach.  SURGEON:  Sandria Bales. Ezzard Standing, MD  FIRST ASSISTANT:  Letha Cape, PA  ANESTHESIA:  General endotracheal.  ESTIMATED BLOOD LOSS:  Minimal.  INDICATION FOR PROCEDURE:  Mr. Corcoran is an unfortunate 57 year old white male who presented with a gastric outlet obstruction.  He underwent endoscopy by Dr. Wendall Papa who biopsied, showed a diffuse type gastric carcinoma.  He has been in the hospital for about the last two weeks trying to build his nutrition up on TPN. He has been seen by Dr. Leonard Schwartz. Sherrill in consultation for oncology. We think we reached a maximum benefit of the TPN and now going to try to proceed with abdominal exploration.  The indication, potential complications of surgery were explained to the patient.  Potential complications include, but are not limited to, bleeding, infection, nerve injury, the possibility of cannot resect the tumor.  We talked about both the gastrostomy tube and the jejunostomy tube and postop recovery.  DESCRIPTION OF PROCEDURE:  The patient was taken to the room #6 at  [facility], underwent a general anesthesia, supervised by Dr. Lucille Passy.  He was given antibiotics of Ancef and Cipro preoperatively.  A time-out was held and the surgical checklist run.  A Foley catheter was placed.  With the patient asleep, he had a palpable mass in his right upper quadrant of the abdomen.  It was probably about 10 to 12 cm in diameter, about 8 cm craniocaudad.  His abdomen was prepped with ChloraPrep and sterilely draped.  I went through a midline incision.  Sharp dissection was carried down to the abdominal cavity.  The right and left lobes of liver had no obvious gross tumor.  His gallbladder was somewhat thickened, but without tumor, but what was impressive, which dominated the abdominal cavity, was a large tumor mass that infiltrated the distal one-third of the stomach along the greater curvature.  It was involving the mesentery of the transverse colon and involving most of the mid transverse colon, into the mesentery of the small bowel below the transverse colon and into the retroperitoneum.  The tumor was fixed in all these areas and there was no practical way of trying to resect this tumor.  I aspirated about 2600 mL of straw-colored fluid.  On palpation, he did have evidence of drop mets in  his pelvic cul de sac.  I then tried to see if there is any way I could get the small bowel up to the anterior wall of the stomach for gastrojejunostomy, but because of how thickened and dense this tumor was in his transverse colon and mesentery and because how thickened small bowel was, particularly the distal one-third appeared to be involving the tumor, I could not get a safe loop just even to a gastrojejunostomy.  Therefore, I thought he will be best served by gastrostomy, we would hopefully get the NG tube out of his nose.  I put a feeding jejunostomy as an alternate source of nutrition rather than TPN that he is on and then we will go from there.  The feeding  jejunostomy with #20 red rubber catheter that was placed into the small bowel and witzeled in and sewn in place with a 2-0 Vicryl suture.  I created a Witzel tunnel 4 to 5 cm.  I then brought this through a stab wound in the left lower quadrant and brought the witzeled small bowel up to the anterior abdominal wall.  There was no kinking of the small bowel.  I then turned my attention to the stomach.  I went about one-half up the way of the stomach where I thought I was away from the tumor.  But the small bowel would barely reached the anterior abdominal wall.  I placed a hole into the stomach and placed a #24 Malecot.  I sewed it in place with a 2-0 Vicryl suture and then tacked it to the anterior abdominal wall with at least four interrupted 2-0 Vicryl sutures.  He tolerated both these procedures well.  He was transferred to the recovery room in good condition.  His abdomen was closed with two running #1 Novafil sutures.  I used Novafil because of poor preop nutritional status.  I inverted the knots.  I did infiltrate the fascia with 30 mL of 0.25% Marcaine, then closed the skin with a skin gun.  The sponge count was correct at the end of the case.  He was transferred to the recovery room in good condition.  The patient tolerated the procedure well.  Because I could not re-establish the continuity of his bowel, his prognosis, I think, is very guarded.  He has a very large tumor which was not amendable to resection right at this time.  We will discuss this with him and his family.   Sandria Bales. Ezzard Standing, M.D., FACS   DHN/MEDQ  D:  09/09/2013  T:  09/10/2013  Job:  [id number]  cc:   Kirk Ruths, M.D. Fax: [phone]  Ladene Artist, M.D. Fax: [phone]  Rachael Fee, MD

## 2013-09-10 NOTE — Progress Notes (Signed)
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9 3.9  CL 106 103 104  CO2 29 28 27   GLUCOSE 115* 116* 126*  BUN 18 18 17   CREATININE 0.67 0.67 0.63  CALCIUM 8.7 8.6 8.3*  MG  --  2.0  --   PHOS  --  4.3  --   PROT 4.7* 4.8* 4.4*  ALBUMIN 1.7* 1.7* 1.5*  AST 102* 105* 87*  ALT 185* 204* 183*  ALKPHOS 914* 964* 902*  BILITOT 2.5* 3.4* 4.5*  BILIDIR  --   --  3.9*  IBILI  --   --  0.6  PREALBUMIN  --  13.2*  --   TRIG  --  76  --   12/20: Ca corrects to 10.5 Estimated Creatinine Clearance: 84.1 ml/min (by C-G formula based on Cr of 0.63).   Recent Labs  09/09/13 1824 09/09/13 2347 09/10/13 0619  GLUCAP 117* 121* 118*   Insulin Requirements in the past 24 hours:  1 units Novolog, on sensitive SSI q6hr   Nutritional Goals:  Re-estimated needs from 12/9 RD note: Kcal: 1770-2065  Protein: 88-106g Fluid: daily Clinimix E 5/15 at 57ml/hr + 20% fat emulsion at 13ml/hr will provide 90 grams protein and 1758 kcal/day.  Current Nutrition: NPO Continuous TNA Clinimix E 5/15 at 75 mL/hr + 20% fat emulsion at 10 mL/hr MIVF: NS + KCl 40 mEq/L at 20 ml/hr  Assessment: 57yo M with gastric outlet obstruction due to gastric mass.  Biopsy showed gastric carcinoma, diffuse type. Unable to place  post-pyloric tube for enteral feedings; parenteral nutrition started 12/5.  12/11: TNA changed to cyclic in case LFT elevation was due to cont dextrose infusion. 12/14: LFTs improved (except Alk phos), there are other possible causes for LFT elevation 12/14: switched back to continuous TNA due to some hypoglycemia in the 6 hours TNA was off each day 12/16: NGT replaced 12/15- continues with 1.1L NGT output past 24h, MD to recheck pre-albumin on 12/18 with possible surgery 12/18 or 12/19 12/19-21: Bilirubin increased, AST/ALT 3x ULN, Alk Phos 8xULN. Likely multi-factorial with TNA (elevation occurred more rapidly than usual), possible obstruction, previous hx of ETOH use, remote possibility liver mets (none seen on CT 11/25) 12/22: Surgery today, with J tube placement. Continues with > 1L NGT output daily. Pt will need TNA post-surgery until bowel function returns and pt is able to tolerate enteral feeding per discussion with Surgery PA-C this AM 12/23: Unable to resect large tumor burden in abdomen. J-tube and G-tube placed. Decreased NGT output  Labs:    Electrolytes: WNL with KCl in maintenance IVF.  Renal: SCr wnl and stable.  LFT's: transaminases, alk phos elevated with slight decrease, Tbili increased to 4.5  Glucose: CBGs at goal (< 150)    TG: 76 (12/22),  74 (12/15), 61(12/8)  Pre-albumin: 13.2 (12/22), 13.8 (12/18), 11.0 (12/15), 9.7(12/11)  Plan:   Continue Clinimix E 5/15 at 56ml/hr   Continue 20% Fat Emulsion at 58ml/hr  TNA to contain standard MVI and trace elements daily.  Continue NS with KCl 40 mEq at 20 ml/hr to deliver ~20 mEq KCl over 24h  Follow-up bowel recovery for timing of enteral feeds through J tube  TNA labs on Mondays and Thursdays  Continue sensitive-scale SSI q6hr  Thank you for the consult.  Tomi Bamberger, PharmD, BCPS Clinical Pharmacist Pager: [phone] Pharmacy: [phone] 09/10/2013 7:33 AM

## 2013-09-10 NOTE — Progress Notes (Signed)
PROGRESS NOTE  Randy Le:[id number] DOB: 12-Mar-1956 DOA: 08/22/2013 PCP: Kirk Ruths, MD  Interval history  57 yo M with HTN, COPD, tobacco abuse, presented to [facility] ED on 08/22/2013 with weight loss of 25 lbs over 5 weeks, poor po intake, abdominal pain and nausea. CT with gastric antral wall thickening. EGD done 08/23/2013 with findings of gastric outlet obstruction, linitis plastica or gastric lymphoma. Biopsy was non-confirmatory of carcinoma and underwent EUS 08/29/2013 with biopsy showing highly atypical malignant cells c/w diffuse type gastric carcinoma. Surgery and oncology were consulted and patient underwent exploratory surgery 12/22.  Assessment/Plan: Abdominal pain secondary to gastric outlet obstruction due to gastric carcinoma per biopsy. NPO for now, TPN, NG tube. Patient with improving nutritional status. Patient underwent exploratory surgery 12/22 and unfortunately he has impressive tumor burden without possibility for resection or bypass. He had placed a J tube for enteral feeding as well as a G tube during surgery. Post op patient was complaining of severe pain, initially on morphine PCA transitioned to dilaudid PCA 12/23. Will start tube feeds via J when surgery OKs it. Dr. Truett Perna to discuss with patient today about chemotherapy.  Cough  - Likely related to possible aspiration pneumonia, now significantly improved  - Patient was started on Zosyn 08/23/2013 and completed a 7 day treatment  - Continue Albuterol nebulizer every 4-6 hours PRN  Abnormal liver function tests  - worsening after TPN - closely monitored. Bilateral Lower Extremity Edema  - Likely related to malnutrition and hypoalbuminemia.  - much improved.  Hyponatremia  - Related to dehydration.  - Resolved.   Diet: NPO Fluids: TPN DVT Prophylaxis: Lovenox  Code Status: Full Family Communication: wife bedside Disposition Plan:  inpatient  Consultants:  Surgery  GI  Oncology  Procedures:  EGD 12/5  EUS 12/11   Antibiotics Zosyn 08/23/2013 --> 08/30/2013  HPI/Subjective: - complains of severe pain not controlled with morphine.   Objective: Filed Vitals:   09/10/13 0015 09/10/13 0152 09/10/13 0354 09/10/13 0622  BP:  100/54  123/62  Pulse:  75  89  Temp:  98 F (36.7 C)  98.5 F (36.9 C)  TempSrc:  Oral  Oral  Resp: 17 20 16 14   Height:      Weight:      SpO2: 100% 100% 100% 99%    Intake/Output Summary (Last 24 hours) at 09/10/13 0719 Last data filed at 09/10/13 4401  Gross per 24 hour  Intake   3600 ml  Output   3835 ml  Net   -235 ml   Filed Weights   08/22/13 1600 08/22/13 2307  Weight: 55.792 kg (123 lb) 59.058 kg (130 lb 3.2 oz)   Exam:  General:  NAD  Cardiovascular: regular rate and rhythm, without MRG  Respiratory: good air movement, clear to auscultation throughout, no wheezing, ronchi or rales  Abdomen: soft, dressing intact. Tender to palpation, positive bowel sounds; NG tube in place  MSK: no peripheral edema  Neuro: non focal  Data Reviewed: Basic Metabolic Panel:  Recent Labs Lab 09/05/13 0530 09/06/13 0515 09/07/13 0610 09/08/13 0440 09/09/13 0406 09/10/13 0500  NA 135 137 136 138 138 136  K 3.7 3.9 3.9 3.8 3.9 3.9  CL 101 104 102 106 103 104  CO2 26 27 27 29 28 27   GLUCOSE 125* 119* 117* 115* 116* 126*  BUN 15 16 16 18 18 17   CREATININE 0.60 0.64 0.66 0.67 0.67 0.63  CALCIUM 8.5 8.6 8.7 8.7 8.6 8.3*  MG 2.0  --   --   --  2.0  --   PHOS 3.7  --   --   --  4.3  --    Liver Function Tests:  Recent Labs Lab 09/06/13 0515 09/07/13 0610 09/08/13 0440 09/09/13 0406 09/10/13 0500  AST 113* 113* 102* 105* 87*  ALT 165* 199* 185* 204* 183*  ALKPHOS 920* 1004* 914* 964* 902*  BILITOT 1.9* 2.0* 2.5* 3.4* 4.5*  PROT 4.7* 5.0* 4.7* 4.8* 4.4*  ALBUMIN 1.7* 1.7* 1.7* 1.7* 1.5*   CBC:  Recent Labs Lab 09/05/13 0530 09/09/13 0406  09/10/13 0500  WBC 7.5 7.6 8.5  NEUTROABS  --  5.2  --   HGB 9.7* 9.7* 9.7*  HCT 29.0* 29.4* 29.3*  MCV 95.1 95.1 95.4  PLT 386 331 316   BNP (last 3 results)  Recent Labs  08/22/13 1727  PROBNP 113.8   CBG:  Recent Labs Lab 09/09/13 1208 09/09/13 1624 09/09/13 1824 09/09/13 2347 09/10/13 0619  GLUCAP 122* 119* 117* 121* 118*   Studies: No results found.  Scheduled Meds: . antiseptic oral rinse  15 mL Mouth Rinse q12n4p  . chlorhexidine  15 mL Mouth Rinse BID  . enoxaparin (LOVENOX) injection  40 mg Subcutaneous Q24H  . insulin aspart  0-9 Units Subcutaneous Q6H  . lip balm  1 application Topical BID  . morphine   Intravenous Q4H  . nicotine  14 mg Transdermal Daily  . pantoprazole (PROTONIX) IV  40 mg Intravenous Q12H  . sodium chloride  10-40 mL Intracatheter Q12H   Continuous Infusions: . Marland KitchenTPN (CLINIMIX-E) Adult 75 mL/hr at 09/09/13 1822   And  . fat emulsion 250 mL (09/09/13 1822)  . sodium chloride 0.9 % 1,000 mL with potassium chloride 40 mEq infusion 20 mL/hr at 09/09/13 1849   Principal Problem:   Gastric cancer Active Problems:   GERD (gastroesophageal reflux disease)   Weight loss, unintentional   Hyponatremia   Dehydration   Tobacco abuse   COPD (chronic obstructive pulmonary disease)   Pedal edema   Protein-calorie malnutrition, severe   Gastric outlet obstruction   Ascites  Time spent: 25  Pamella Pert, MD [facility] Pager [phone]. If 7 PM - 7 AM, please contact night-coverage at [email or web address], password [email or web address] 09/10/2013, 7:19 AM  LOS: 19 days

## 2013-09-10 NOTE — Progress Notes (Signed)
IP PROGRESS NOTE  Subjective:   He is alert and appears comfortable. The operative findings were reviewed with him by Dr. Ezzard Standing.  Objective: Vital signs in last 24 hours: Blood pressure 106/62, pulse 86, temperature 98 F (36.7 C), temperature source Oral, resp. rate 16, height 5\' 8"  (1.727 m), weight 130 lb 3.2 oz (59.058 kg), SpO2 95.00%.  Intake/Output from previous day: 12/22 0701 - 12/23 0700 In: 3600 [I.V.:3600] Out: 3835 [Urine:800; Emesis/NG output:300; Drains:10; Blood:25]  Physical Exam:  Not performed today  Lab Results:  Recent Labs  09/09/13 0406 09/10/13 0500  WBC 7.6 8.5  HGB 9.7* 9.7*  HCT 29.4* 29.3*  PLT 331 316    BMET  Recent Labs  09/09/13 0406 09/10/13 0500  NA 138 136  K 3.9 3.9  CL 103 104  CO2 28 27  GLUCOSE 116* 126*  BUN 18 17  CREATININE 0.67 0.63  CALCIUM 8.6 8.3*    Studies/Results: No results found.  Medications: I have reviewed the patient's current medications.  Assessment/Plan: 1. Gastric cancer -stage IV-tumor involving the distal one third of the stomach, transverse colon, mesentery of the transverse colon/small cell, retroperitoneum, and pelvic "drop metastases "identified at the time of surgery on 09/09/2013. 2. Gastric outlet obstruction secondary to #1 , status post a palliative gastrostomy tube and jejunostomy feeding tube placement 09/09/2013 3. Malnutrition  4. Ascites-likely malignant 5. COPD  I discussed the operative findings with Dr. Ezzard Standing. I discussed the prognosis and treatment options with Mr. Chaput today. He understands no therapy will be curative. I estimated his survival in months. We discussed supportive care versus a trial of systemic chemotherapy. He would like to consider chemotherapy with the hope of obtaining a partial remission and potentially extending survival.  I will continue discussing options with him over the next several days. He indicated that he would like to [facility] care at the  [facility]. We will schedule outpatient followup for within the next few weeks.  Medications: 1. Postoperative care for surgery 2. Convert to jejunostomy tube feedings as tolerated 3. Leave PICC in place at discharge in anticipation of chemotherapy 4. Outpatient followup at the [facility] 5. Check for HER-2/neu amplification on the surgical biopsy   LOS: 19 days   Analya Louissaint  09/10/2013, 7:03 PM

## 2013-09-11 DIAGNOSIS — C169 Malignant neoplasm of stomach, unspecified: Secondary | ICD-10-CM

## 2013-09-11 LAB — COMPREHENSIVE METABOLIC PANEL
ALT: 125 U/L — ABNORMAL HIGH (ref 0–53)
AST: 41 U/L — ABNORMAL HIGH (ref 0–37)
Albumin: 1.5 g/dL — ABNORMAL LOW (ref 3.5–5.2)
Alkaline Phosphatase: 799 U/L — ABNORMAL HIGH (ref 39–117)
Calcium: 8.6 mg/dL (ref 8.4–10.5)
Glucose, Bld: 122 mg/dL — ABNORMAL HIGH (ref 70–99)
Potassium: 4 mEq/L (ref 3.5–5.1)
Sodium: 134 mEq/L — ABNORMAL LOW (ref 135–145)
Total Protein: 4.6 g/dL — ABNORMAL LOW (ref 6.0–8.3)

## 2013-09-11 LAB — GLUCOSE, CAPILLARY
Glucose-Capillary: 115 mg/dL — ABNORMAL HIGH (ref 70–99)
Glucose-Capillary: 119 mg/dL — ABNORMAL HIGH (ref 70–99)

## 2013-09-11 LAB — PHOSPHORUS: Phosphorus: 3.9 mg/dL (ref 2.3–4.6)

## 2013-09-11 LAB — CBC
HCT: 27.3 % — ABNORMAL LOW (ref 39.0–52.0)
Hemoglobin: 9.1 g/dL — ABNORMAL LOW (ref 13.0–17.0)
MCH: 31.9 pg (ref 26.0–34.0)
MCV: 95.8 fL (ref 78.0–100.0)
Platelets: 327 10*3/uL (ref 150–400)
RBC: 2.85 MIL/uL — ABNORMAL LOW (ref 4.22–5.81)

## 2013-09-11 LAB — MAGNESIUM: Magnesium: 1.9 mg/dL (ref 1.5–2.5)

## 2013-09-11 MED ORDER — FENTANYL 12 MCG/HR TD PT72
12.5000 ug | MEDICATED_PATCH | TRANSDERMAL | Status: DC
  Administered 2013-09-11: 12.5 ug via TRANSDERMAL
  Filled 2013-09-11: qty 1

## 2013-09-11 MED ORDER — TRACE MINERALS CR-CU-F-FE-I-MN-MO-SE-ZN IV SOLN
INTRAVENOUS | Status: AC
  Administered 2013-09-11: 18:00:00 via INTRAVENOUS
  Filled 2013-09-11: qty 2000

## 2013-09-11 MED ORDER — VITAL AF 1.2 CAL PO LIQD
1000.0000 mL | ORAL | Status: DC
  Administered 2013-09-11 – 2013-09-16 (×6): 1000 mL
  Filled 2013-09-11 (×8): qty 1000

## 2013-09-11 MED ORDER — FAT EMULSION 20 % IV EMUL
250.0000 mL | INTRAVENOUS | Status: AC
  Administered 2013-09-11: 250 mL via INTRAVENOUS
  Filled 2013-09-11: qty 250

## 2013-09-11 NOTE — Progress Notes (Signed)
Patient 48 hours post-op. Per protocol, order placed to remove foley catheter. Patient was informed and agreeable to foley removal. Foley removed intact, patient tolerated well. 275 cc bronze-colored urine emptied from foley cath

## 2013-09-11 NOTE — Progress Notes (Addendum)
[facility] PROGRESS NOTE  CASSADY TURANO ZOX:[id number] DOB: [birth year] DOA: 08/22/2013 PCP: Kirk Ruths, MD  Brief narrative: 57 yo M with past medical history of HTN, COPD, tobacco abuse, presented initially to [facility] ED on 08/22/2013 with weight loss of 25 lbs over 5 weeks, poor po intake, abdominal pain and nausea. CT showed gastric antral wall thickening. EGD done 08/23/2013 was with findings of gastric outlet obstruction, linitis plastica or gastric lymphoma. Biopsy was non-confirmatory of carcinoma and pt subsequently underwent EUS 08/29/2013 with biopsy showing highly atypical malignant cells c/w diffuse type gastric carcinoma. Surgery and oncology were consulted and patient underwent exploratory surgery 09/09/13 with no possibility of resection or bypass due to significant tumor burden.   Assessment/Plan:   Principal Problem: Abdominal pain secondary to gastric outlet obstruction due to gastric carcinoma  - as noted above EUS biopsy proven gastric carcinoma; status post exploratory laparotomy 09/09/2013, due to significant tumor burden there was no possibility of resection or bypass; now has feeding J tube and G tube for drainage. We are tapering TNA. Pt is still NPO. NG tube is now out. - pain management with dilaudid PCA and fentanyl patch 12.5 mg every 3 days  Active problems: Gastric carcinoma - plan for chemotherapy outpatient - will need to keep PICC line on discharge  Abnormal liver function tests  - worsening after TPN  Bilateral Lower Extremity Edema  - Likely related to malnutrition and hypoalbuminemia.  - improving  Hyponatremia  - Related to dehydration.  - Resolved.  Anemia of chronic disease - related to gastric cancer - hemoglobin stable at 9.1 Cough  - initially had cough which was likely due to aspiration pneumonia. Pt has received zosyn for 7 days. - resolved    Code Status: Full  Family Communication: wife bedside  Disposition Plan: home when  stable   Consultants:  Surgery  GI  Oncology Procedures:  EGD 08/23/13 EUS 08/29/13 Exploratory laparotomy 09/09/2013 Antibiotics  Zosyn 08/23/2013 --> 08/30/2013   Code Status: full code  Family Communication: no family at the bedside Disposition Plan: home when stable   Manson Passey, MD  [facility] Pager [phone]  If 7PM-7AM, please contact night-coverage [email or web address] Password [email or web address] 09/11/2013, 10:00 AM   LOS: 20 days    HPI/Subjective: No overnight events.  Objective: Filed Vitals:   09/11/13 0202 09/11/13 0353 09/11/13 0634 09/11/13 0813  BP: 109/60  105/64   Pulse: 89  87   Temp: 97.8 F (36.6 C)  97.8 F (36.6 C)   TempSrc: Oral  Oral   Resp: 18 22 20 23   Height:      Weight:      SpO2: 98% 96% 96% 97%    Intake/Output Summary (Last 24 hours) at 09/11/13 1000 Last data filed at 09/11/13 0930  Gross per 24 hour  Intake      0 ml  Output   1385 ml  Net  -1385 ml    Exam:   General:  Pt is alert, follows commands appropriately, not in acute distress  Cardiovascular: Regular rate and rhythm, S1/S2, no murmurs, no rubs, no gallops  Respiratory: Clear to auscultation bilaterally, no wheezing, no crackles, no rhonchi  Abdomen: Soft, tender in mid abdomen, non distended, bowel sounds present, has feeding tube and one for drainage   Extremities: No edema, pulses DP and PT palpable bilaterally  Neuro: Grossly nonfocal  Data Reviewed: Basic Metabolic Panel:  Recent Labs Lab 09/05/13 0530  09/07/13 0610 09/08/13 0440 09/09/13 0406 09/10/13 0500  09/11/13 0505  NA 135  < > 136 138 138 136 134*  K 3.7  < > 3.9 3.8 3.9 3.9 4.0  CL 101  < > 102 106 103 104 102  CO2 26  < > 27 29 28 27 28   GLUCOSE 125*  < > 117* 115* 116* 126* 122*  BUN 15  < > 16 18 18 17 16   CREATININE 0.60  < > 0.66 0.67 0.67 0.63 0.59  CALCIUM 8.5  < > 8.7 8.7 8.6 8.3* 8.6  MG 2.0  --   --   --  2.0  --  1.9  PHOS 3.7  --   --   --  4.3  --  3.9  < > = values  in this interval not displayed. Liver Function Tests:  Recent Labs Lab 09/07/13 0610 09/08/13 0440 09/09/13 0406 09/10/13 0500 09/11/13 0505  AST 113* 102* 105* 87* 41*  ALT 199* 185* 204* 183* 125*  ALKPHOS 1004* 914* 964* 902* 799*  BILITOT 2.0* 2.5* 3.4* 4.5* 3.5*  PROT 5.0* 4.7* 4.8* 4.4* 4.6*  ALBUMIN 1.7* 1.7* 1.7* 1.5* 1.5*   No results found for this basename: LIPASE, AMYLASE,  in the last 168 hours No results found for this basename: AMMONIA,  in the last 168 hours CBC:  Recent Labs Lab 09/05/13 0530 09/09/13 0406 09/10/13 0500 09/11/13 0505  WBC 7.5 7.6 8.5 8.9  NEUTROABS  --  5.2  --   --   HGB 9.7* 9.7* 9.7* 9.1*  HCT 29.0* 29.4* 29.3* 27.3*  MCV 95.1 95.1 95.4 95.8  PLT 386 331 316 327   Cardiac Enzymes: No results found for this basename: CKTOTAL, CKMB, CKMBINDEX, TROPONINI,  in the last 168 hours BNP: No components found with this basename: POCBNP,  CBG:  Recent Labs Lab 09/10/13 0619 09/10/13 1152 09/10/13 1752 09/10/13 2353 09/11/13 0542  GLUCAP 118* 176* 116* 121* 120*    No results found for this or any previous visit (from the past 240 hour(s)).   Studies: No results found.  Scheduled Meds: . enoxaparin (LOVENOX)   40 mg Subcutaneous Q24H  . fentaNYL  12.5 mcg Transdermal Q72H  . HYDROmorphone PCA    Intravenous Q4H  . insulin aspart  0-9 Units Subcutaneous Q6H  . nicotine  14 mg Transdermal Daily  . pantoprazole  40 mg Intravenous Q12H   Continuous Infusions: . Marland KitchenTPN (CLINIMIX-E) Adult 75 mL/hr at 09/10/13 1726   And  . fat emulsion 250 mL (09/10/13 1726)  . Marland KitchenTPN (CLINIMIX-E) Adult     And  . fat emulsion    . sodium chloride 0.9 % 1,000 mL with potassium chloride 40 mEq infusion 20 mL/hr at 09/09/13 1849

## 2013-09-11 NOTE — Progress Notes (Addendum)
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  No Known Allergies  Patient Measurements: Height: 5\' 8"  (172.7 cm) Weight: 130 lb 3.2 oz (59.058 kg) IBW/kg (Calculated) : 68.4  Vital Signs: Temp: 97.8 F (36.6 C) (12/24 0634) Temp src: Oral (12/24 0634) BP: 105/64 mmHg (12/24 0634) Pulse Rate: 87 (12/24 0634)  Intake/Output from previous day: 12/23 0701 - 12/24 0700 In: -  Out: 1385 [Urine:700; Drains:685]  Labs:  Recent Labs  09/09/13 0406 09/10/13 0500 09/11/13 0505  WBC 7.6 8.5 8.9  HGB 9.7* 9.7* 9.1*  HCT 29.4* 29.3* 27.3*  PLT 331 316 327    Recent Labs  09/09/13 0406 09/10/13 0500 09/11/13 0505  NA 138 136 134*  K 3.9 3.9 4.0  CL 103 104 102  CO2 28 27 28   GLUCOSE 116* 126* 122*  BUN 18 17 16   CREATININE 0.67 0.63 0.59  CALCIUM 8.6 8.3* 8.6  MG 2.0  --  1.9  PHOS 4.3  --  3.9  PROT 4.8* 4.4* 4.6*  ALBUMIN 1.7* 1.5* 1.5*  AST 105* 87* 41*  ALT 204* 183* 125*  ALKPHOS 964* 902* 799*  BILITOT 3.4* 4.5* 3.5*  BILIDIR  --  3.9*  --   IBILI  --  0.6  --   PREALBUMIN 13.2*  --   --   TRIG 76  --   --   12/24: Ca corrects to 10.6 Estimated Creatinine Clearance: 84.1 ml/min (by C-G formula based on Cr of 0.59).   Recent Labs  09/10/13 1752 09/10/13 2353 09/11/13 0542  GLUCAP 116* 121* 120*   Insulin Requirements in the past 24 hours:  2 units Novolog, on sensitive SSI q6hr   Nutritional Goals:  Re-estimated needs from 12/9 RD note: Kcal: 1770-2065  Protein: 88-106g Fluid: daily Clinimix E 5/15 at 5ml/hr + 20% fat emulsion at 80ml/hr will provide 90 grams protein and 1758 kcal/day.  Current Nutrition: NPO Continuous TNA Clinimix E 5/15 at 75 mL/hr + 20% fat emulsion at 10 mL/hr MIVF: NS + KCl 40 mEq/L at 20 ml/hr  Assessment: 57yo M with gastric outlet obstruction due to gastric mass.  Biopsy showed gastric carcinoma, diffuse type. Unable to place post-pyloric tube  for enteral feedings; parenteral nutrition started 12/5.  12/11: TNA changed to cyclic in case LFT elevation was due to cont dextrose infusion. 12/14: LFTs improved (except Alk phos), there are other possible causes for LFT elevation 12/14: switched back to continuous TNA due to some hypoglycemia in the 6 hours TNA was off each day 12/16: NGT replaced 12/15- continues with 1.1L NGT output past 24h, MD to recheck pre-albumin on 12/18 with possible surgery 12/18 or 12/19 12/19-21: Bilirubin increased, AST/ALT 3x ULN, Alk Phos 8xULN. Likely multi-factorial with TNA (elevation occurred more rapidly than usual), possible obstruction, previous hx of ETOH use, remote possibility liver mets (none seen on CT 11/25) 12/22: Surgery today, with J tube placement. Continues with > 1L NGT output daily. Pt will need TNA post-surgery until bowel function returns and pt is able to tolerate enteral feeding per discussion with Surgery PA-C this AM 12/23: Unable to resect large tumor burden in abdomen. J-tube and G-tube placed. Decreased NGT output 12/24: no changes, await plans per surgery and oncology teams  Labs:    Electrolytes: Stable (Na just below normal at 134) with KCl in maintenance IVF.  Renal: SCr wnl and stable.  LFT's: transaminases, alk phos, tbili elevated but improving now  Glucose: CBGs at goal (< 150)    TG: 76 (12/22), 74 (12/15), 61(12/8)  Pre-albumin: 13.2 (12/22), 13.8 (12/18), 11.0 (12/15), 9.7(12/11)  Plan:   Continue Clinimix E 5/15 at goal rate 25ml/hr   Continue 20% Fat Emulsion at 16ml/hr  TNA to contain standard MVI and trace elements daily.  Continue NS with KCl 40 mEq at 20 ml/hr to deliver ~20 mEq KCl over 24h  Follow-up bowel recovery for timing of enteral feeds through J tube - per [facility] notes to start trickle feeds today so will not start to wean TNA yet - will follow up tomorrow  TNA labs on Mondays and Thursdays  Continue sensitive-scale SSI q6hr   Hessie Knows, PharmD, BCPS Pager [phone] 09/11/2013 8:11 AM

## 2013-09-11 NOTE — Progress Notes (Signed)
2 Days Post-Op  Subjective: Still having some pain from sites, less drainage around the tubes.  He ask if he was every going to be able to eat again and i told him just what we can drain from the gastrostomy tube.  Objective: Vital signs in last 24 hours: Temp:  [97.6 F (36.4 C)-98.2 F (36.8 C)] 97.8 F (36.6 C) (12/24 0634) Pulse Rate:  [85-94] 87 (12/24 0634) Resp:  [16-23] 23 (12/24 0813) BP: (101-127)/(45-75) 105/64 mmHg (12/24 0634) SpO2:  [91 %-100 %] 97 % (12/24 0813) Last BM Date: 09/06/13 685 FROM Drain, afebrile Labs OK Intake/Output from previous day: 12/23 0701 - 12/24 0700 In: -  Out: 1385 [Urine:700; Drains:685] Intake/Output this shift:    General appearance: alert, cooperative and no distress Resp: clear to auscultation bilaterally GI: soft, sore, no drainage from the central incision, not much drainage from tube sites.  Lab Results:   Recent Labs  09/10/13 0500 09/11/13 0505  WBC 8.5 8.9  HGB 9.7* 9.1*  HCT 29.3* 27.3*  PLT 316 327    BMET  Recent Labs  09/10/13 0500 09/11/13 0505  NA 136 134*  K 3.9 4.0  CL 104 102  CO2 27 28  GLUCOSE 126* 122*  BUN 17 16  CREATININE 0.63 0.59  CALCIUM 8.3* 8.6   PT/INR No results found for this basename: LABPROT, INR,  in the last 72 hours   Recent Labs Lab 09/07/13 0610 09/08/13 0440 09/09/13 0406 09/10/13 0500 09/11/13 0505  AST 113* 102* 105* 87* 41*  ALT 199* 185* 204* 183* 125*  ALKPHOS 1004* 914* 964* 902* 799*  BILITOT 2.0* 2.5* 3.4* 4.5* 3.5*  PROT 5.0* 4.7* 4.8* 4.4* 4.6*  ALBUMIN 1.7* 1.7* 1.7* 1.5* 1.5*     Lipase     Component Value Date/Time   LIPASE 61* 08/22/2013 1727     Studies/Results: No results found.  Medications: . antiseptic oral rinse  15 mL Mouth Rinse q12n4p  . chlorhexidine  15 mL Mouth Rinse BID  . enoxaparin (LOVENOX) injection  40 mg Subcutaneous Q24H  . HYDROmorphone PCA 0.3 mg/mL   Intravenous Q4H  . insulin aspart  0-9 Units Subcutaneous Q6H   . lip balm  1 application Topical BID  . nicotine  14 mg Transdermal Daily  . pantoprazole (PROTONIX) IV  40 mg Intravenous Q12H  . sodium chloride  10-40 mL Intracatheter Q12H    Assessment/Plan 1.  Gastric outlet obstruction secondary to gastric cancer, extensive tumor involving distal 1/3 of stomach, transverse colon, mesentery of transverse colon/small bowel, retroperitoneum, drop mets in pelvic cul de sac  Open abdominal exploration, #24 [place] gastrostomy tube, #20 red rubber jejunostomy tube.  SURGEON: Ovidio Kin, M.D. 09/09/2013  2. Hypertension  3. COPD/TOBACCO USE  4. Malnutrition (prealbumin 6.0 08/25/13) Currently on TNA  5. Hx of ETOH use,  6. New LFT elevations on TNA/hx of ETOH use in the past. Possible cirrhosis?  7. Lower leg swelling, now improving with TNA.  8. Possible aspiration     Plan:  We will start trickle tube feeds today thru jejunostomy tube, continue TNA for now.  I will add a fentanyl patch to help with pain control.  Start to mobilize.   LOS: 20 days    Randy Le 09/11/2013

## 2013-09-11 NOTE — Progress Notes (Signed)
NUTRITION FOLLOW UP  Intervention:   - TPN wean per pharmacy - Recommend Daily Weights - Initiate Vital AF 1.2 @ 10 ml/hr via J tube and increase by 10 ml every 12 hours to goal rate of 60 ml/hr. At goal rate, tube feeding regimen will provide 1728 kcal, 108 grams of protein, and 1168 ml of free H2O. This will meet 95% of estimated energy needs and 103% of estimated protein needs. If pt is able to tolerate Vital AF 1.2 at goal rate without complications may consider transitioning pt to a standard TF such as Jevity 1.2 or Osmolite.  - Once continuous infusions discontinue; recommend additional 100 ml free water flushes 6 times daily to provide a total of 1768 ml of free water daily.  - RD to continue to monitor  Nutrition Dx:   Inadequate oral intake related to inability to eat, altered GI funtion as evidenced by NPO status, NG tube placed for gastric decompression; ongoing  Goal:   Pt to meet >/= 90% of their estimated nutrition needs; being met  Monitor:   Nutrition Support, Labs, Weights   Assessment:   57yo M with gastric outlet obstruction due to gastric mass. Biopsy showed gastric carcinoma, diffuse type. Unable to place post-pyloric tube for enteral feedings; parenteral nutrition started 12/5.  Clinimix 5/15 @ Clinimix E 5/15 at 75 mL/hr + 20% fat emulsion at 10 mL/hr. Regimen delivers 90 grams protein and 1758 kcal/day. This meets 97% of estimated energy needs and 100% of estimated protein needs. Pt's weight using the bed scale 12/16 was 119 lbs; pt states that when he came to [facility] he weighed 123 lbs.   12/23: Pt now has G and J tube in place. RD consulted for TF management. Pt states he has not had anything to eat for 6 weeks now. He reports a 2 year history of diarrhea after having 2-3 feet of his small bowel removed 2 years ago. Recommend starting TF's using Vital AF 1.2 due to history of bowel surgery and diarrhea, and due to no PO intake for 6 weeks.   12/24: RD consulted for  enteral/TF management: Start jejunostomy feeds today, 10-15 ml to start, we will advance slowly over the next few days and wean TNA. No new weights. Discussed with pt that TF will be advanced slowly and to let RN know if he feels nausea, pain, or distension.   Blood glucose ranging 115 to 176 mg/dL Low hemoglobin Potassium, magnesium, and phosphorus are WNL. Low sodium Elevated AST, ALT, and Alk Phos Elevated bilirubin  Height: Ht Readings from Last 1 Encounters:  08/22/13 5\' 8"  (1.727 m)    Weight Status:   Wt Readings from Last 1 Encounters:  08/22/13 130 lb 3.2 oz (59.058 kg)    Re-estimated needs:  Kcal: 1820-2080  Protein: 90-105 grams Fluid: 1.8 L daily   Skin: abdominal incision  Diet Order: NPO   Intake/Output Summary (Last 24 hours) at 09/11/13 1010 Last data filed at 09/11/13 0930  Gross per 24 hour  Intake      0 ml  Output   1385 ml  Net  -1385 ml    Last BM: 09/06/13   Labs:   Recent Labs Lab 09/05/13 0530  09/09/13 0406 09/10/13 0500 09/11/13 0505  NA 135  < > 138 136 134*  K 3.7  < > 3.9 3.9 4.0  CL 101  < > 103 104 102  CO2 26  < > 28 27 28   BUN 15  < >  18 17 16   CREATININE 0.60  < > 0.67 0.63 0.59  CALCIUM 8.5  < > 8.6 8.3* 8.6  MG 2.0  --  2.0  --  1.9  PHOS 3.7  --  4.3  --  3.9  GLUCOSE 125*  < > 116* 126* 122*  < > = values in this interval not displayed.  CBG (last 3)   Recent Labs  09/10/13 1752 09/10/13 2353 09/11/13 0542  GLUCAP 116* 121* 120*    Scheduled Meds: . antiseptic oral rinse  15 mL Mouth Rinse q12n4p  . chlorhexidine  15 mL Mouth Rinse BID  . enoxaparin (LOVENOX) injection  40 mg Subcutaneous Q24H  . fentaNYL  12.5 mcg Transdermal Q72H  . HYDROmorphone PCA 0.3 mg/mL   Intravenous Q4H  . insulin aspart  0-9 Units Subcutaneous Q6H  . lip balm  1 application Topical BID  . nicotine  14 mg Transdermal Daily  . pantoprazole (PROTONIX) IV  40 mg Intravenous Q12H  . sodium chloride  10-40 mL Intracatheter  Q12H    Continuous Infusions: . Marland KitchenTPN (CLINIMIX-E) Adult 75 mL/hr at 09/10/13 1726   And  . fat emulsion 250 mL (09/10/13 1726)  . Marland KitchenTPN (CLINIMIX-E) Adult     And  . fat emulsion    . sodium chloride 0.9 % 1,000 mL with potassium chloride 40 mEq infusion 20 mL/hr at 09/09/13 1849    Ian Malkin RD, LDN Inpatient Clinical Dietitian Pager: [phone] After Hours Pager: [phone]

## 2013-09-11 NOTE — Progress Notes (Signed)
Agree with A&P of WJ,PA. He was asleep when I came by so did not waken him

## 2013-09-12 LAB — COMPREHENSIVE METABOLIC PANEL
AST: 25 U/L (ref 0–37)
Albumin: 1.4 g/dL — ABNORMAL LOW (ref 3.5–5.2)
Alkaline Phosphatase: 702 U/L — ABNORMAL HIGH (ref 39–117)
BUN: 15 mg/dL (ref 6–23)
CO2: 27 mEq/L (ref 19–32)
Calcium: 8.6 mg/dL (ref 8.4–10.5)
Chloride: 102 mEq/L (ref 96–112)
Creatinine, Ser: 0.56 mg/dL (ref 0.50–1.35)
GFR calc non Af Amer: >90 mL/min (ref >90)
Glucose, Bld: 118 mg/dL — ABNORMAL HIGH (ref 70–99)
Potassium: 3.8 mEq/L (ref 3.5–5.1)
Total Bilirubin: 2.8 mg/dL — ABNORMAL HIGH (ref 0.3–1.2)

## 2013-09-12 LAB — GLUCOSE, CAPILLARY
Glucose-Capillary: 125 mg/dL — ABNORMAL HIGH (ref 70–99)
Glucose-Capillary: 125 mg/dL — ABNORMAL HIGH (ref 70–99)

## 2013-09-12 LAB — MAGNESIUM: Magnesium: 1.9 mg/dL (ref 1.5–2.5)

## 2013-09-12 MED ORDER — FENTANYL 25 MCG/HR TD PT72
25.0000 ug | MEDICATED_PATCH | TRANSDERMAL | Status: DC
  Administered 2013-09-12 – 2013-09-15 (×2): 25 ug via TRANSDERMAL
  Filled 2013-09-12 (×2): qty 1

## 2013-09-12 MED ORDER — FAT EMULSION 20 % IV EMUL
250.0000 mL | INTRAVENOUS | Status: AC
  Administered 2013-09-12: 250 mL via INTRAVENOUS
  Filled 2013-09-12: qty 250

## 2013-09-12 MED ORDER — TRACE MINERALS CR-CU-F-FE-I-MN-MO-SE-ZN IV SOLN
INTRAVENOUS | Status: AC
  Administered 2013-09-12: 17:00:00 via INTRAVENOUS
  Filled 2013-09-12: qty 2000

## 2013-09-12 NOTE — Progress Notes (Signed)
[facility] PROGRESS NOTE  Randy Le JYN:[id number] DOB: September 11, 1956 DOA: 08/22/2013 PCP: Kirk Ruths, MD  Brief narrative: 57 yo M with past medical history of HTN, COPD, tobacco abuse, presented initially to [facility] ED on 08/22/2013 with weight loss of 25 lbs over 5 weeks, poor po intake, abdominal pain and nausea. CT showed gastric antral wall thickening. EGD done 08/23/2013 was with findings of gastric outlet obstruction, linitis plastica or gastric lymphoma. Biopsy was non-confirmatory of carcinoma and pt subsequently underwent EUS 08/29/2013 with biopsy showing highly atypical malignant cells c/w diffuse type gastric carcinoma. Surgery and oncology were consulted and patient underwent exploratory surgery 09/09/13 with no possibility of resection or bypass due to significant tumor burden.   Assessment/Plan:   Principal Problem:  Abdominal pain secondary to gastric outlet obstruction due to gastric carcinoma  - as noted above EUS biopsy proven gastric carcinoma; status post exploratory laparotomy 09/09/2013, due to significant tumor burden there was no possibility of resection or bypass; now has feeding J tube and G tube for drainage. Tapering TNA and increasing tube feedings  - pain management with dilaudid PCA and fentanyl patch  Active problems:  Gastric carcinoma  - plan for chemotherapy outpatient  - will need to keep PICC line on discharge  Abnormal liver function tests  - worsening after TPN but now slow downward trend with AST now WNL and ALT 84 and ALP 702 Bilateral Lower Extremity Edema  - Likely related to malnutrition and hypoalbuminemia.  - improving  Hyponatremia  - Related to dehydration.  - Resolved.  Anemia of chronic disease  - related to gastric cancer  - hemoglobin stable at 9.1  Cough  - initially had cough which was likely due to aspiration pneumonia. Pt has received zosyn for 7 days.  - resolved    Code Status: Full  Family Communication: wife  bedside  Disposition Plan: home when stable    Consultants:  Surgery  GI  Oncology Procedures:  EGD 08/23/13  EUS 08/29/13  Exploratory laparotomy 09/09/2013 Antibiotics  Zosyn 08/23/2013 --> 08/30/2013     Manson Passey, MD  [facility] Pager [phone]  If 7PM-7AM, please contact night-coverage [email or web address] Password [facility] 09/12/2013, 7:31 AM   LOS: 21 days    HPI/Subjective: Feels better.  Objective: Filed Vitals:   09/12/13 0151 09/12/13 0407 09/12/13 0600 09/12/13 0720  BP: 110/62  106/59   Pulse: 88  88   Temp: 97.2 F (36.2 C)  97.9 F (36.6 C)   TempSrc: Axillary  Oral   Resp: 13 16 23    Height:      Weight:    58.6 kg (129 lb 3 oz)  SpO2: 100% 96% 98%     Intake/Output Summary (Last 24 hours) at 09/12/13 0731 Last data filed at 09/12/13 9629  Gross per 24 hour  Intake      0 ml  Output   2400 ml  Net  -2400 ml   Exam:  General: Pt is alert, follows commands appropriately, not in acute distress  Cardiovascular: Regular rate and rhythm, S1/S2, no murmurs, no rubs, no gallops  Respiratory: Clear to auscultation bilaterally, no wheezing, no crackles, no rhonchi  Abdomen: Soft, tender in mid abdomen, non distended, bowel sounds present, has feeding tube and one for drainage  Extremities: No edema, pulses DP and PT palpable bilaterally  Neuro: Grossly nonfocal  Data Reviewed: Basic Metabolic Panel:  Recent Labs Lab 09/08/13 0440 09/09/13 0406 09/10/13 0500 09/11/13 0505 09/12/13 0530  NA 138 138  136 134* 135  K 3.8 3.9 3.9 4.0 3.8  CL 106 103 104 102 102  CO2 29 28 27 28 27   GLUCOSE 115* 116* 126* 122* 118*  BUN 18 18 17 16 15   CREATININE 0.67 0.67 0.63 0.59 0.56  CALCIUM 8.7 8.6 8.3* 8.6 8.6  MG  --  2.0  --  1.9 1.9  PHOS  --  4.3  --  3.9 4.1   Liver Function Tests:  Recent Labs Lab 09/08/13 0440 09/09/13 0406 09/10/13 0500 09/11/13 0505 09/12/13 0530  AST 102* 105* 87* 41* 25  ALT 185* 204* 183* 125* 84*  ALKPHOS  914* 964* 902* 799* 702*  BILITOT 2.5* 3.4* 4.5* 3.5* 2.8*  PROT 4.7* 4.8* 4.4* 4.6* 4.6*  ALBUMIN 1.7* 1.7* 1.5* 1.5* 1.4*   No results found for this basename: LIPASE, AMYLASE,  in the last 168 hours No results found for this basename: AMMONIA,  in the last 168 hours CBC:  Recent Labs Lab 09/09/13 0406 09/10/13 0500 09/11/13 0505  WBC 7.6 8.5 8.9  NEUTROABS 5.2  --   --   HGB 9.7* 9.7* 9.1*  HCT 29.4* 29.3* 27.3*  MCV 95.1 95.4 95.8  PLT 331 316 327   Cardiac Enzymes: No results found for this basename: CKTOTAL, CKMB, CKMBINDEX, TROPONINI,  in the last 168 hours BNP: No components found with this basename: POCBNP,  CBG:  Recent Labs Lab 09/11/13 0542 09/11/13 1210 09/11/13 1743 09/11/13 2354 09/12/13 0557  GLUCAP 120* 115* 129* 119* 125*    No results found for this or any previous visit (from the past 240 hour(s)).   Studies: No results found.  Scheduled Meds: . antiseptic oral rinse  15 mL Mouth Rinse q12n4p  . chlorhexidine  15 mL Mouth Rinse BID  . enoxaparin (LOVENOX) injection  40 mg Subcutaneous Q24H  . feeding supplement (VITAL AF 1.2 CAL)  1,000 mL Per Tube Q24H  . fentaNYL  12.5 mcg Transdermal Q72H  . HYDROmorphone PCA 0.3 mg/mL   Intravenous Q4H  . insulin aspart  0-9 Units Subcutaneous Q6H  . lip balm  1 application Topical BID  . nicotine  14 mg Transdermal Daily  . pantoprazole (PROTONIX) IV  40 mg Intravenous Q12H  . sodium chloride  10-40 mL Intracatheter Q12H   Continuous Infusions: . Marland KitchenTPN (CLINIMIX-E) Adult 75 mL/hr at 09/11/13 1756   And  . fat emulsion 250 mL (09/11/13 1756)  . Marland KitchenTPN (CLINIMIX-E) Adult     And  . fat emulsion    . sodium chloride 0.9 % 1,000 mL with potassium chloride 40 mEq infusion 20 mL/hr at 09/11/13 2116

## 2013-09-12 NOTE — Anesthesia Postprocedure Evaluation (Signed)
Anesthesia Post Note  Patient: Randy Le  Procedure(s) Performed: Procedure(s) (LRB): JEJUNOSTOMY (N/A) GASTROSTOMY TUBE (N/A)  Anesthesia type: General  Patient location: PACU  Post pain: Pain level controlled  Post assessment: Post-op Vital signs reviewed  Last Vitals:  Filed Vitals:   09/12/13 0819  BP:   Pulse:   Temp:   Resp: 14    Post vital signs: Reviewed  Level of consciousness: sedated  Complications: No apparent anesthesia complications

## 2013-09-12 NOTE — Progress Notes (Signed)
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  No Known Allergies  Patient Measurements: Height: 5\' 8"  (172.7 cm) Weight: 134 lb 4.2 oz (60.9 kg) IBW/kg (Calculated) : 68.4  Vital Signs: Temp: 97.9 F (36.6 C) (12/25 0600) Temp src: Oral (12/25 0600) BP: 106/59 mmHg (12/25 0600) Pulse Rate: 88 (12/25 0600)  Intake/Output from previous day: 12/24 0701 - 12/25 0700 In: 0  Out: 2400 [Urine:1025; Drains:1375]  Labs:  Recent Labs  09/10/13 0500 09/11/13 0505  WBC 8.5 8.9  HGB 9.7* 9.1*  HCT 29.3* 27.3*  PLT 316 327    Recent Labs  09/10/13 0500 09/11/13 0505 09/12/13 0530  NA 136 134* 135  K 3.9 4.0 3.8  CL 104 102 102  CO2 27 28 27   GLUCOSE 126* 122* 118*  BUN 17 16 15   CREATININE 0.63 0.59 0.56  CALCIUM 8.3* 8.6 8.6  MG  --  1.9 1.9  PHOS  --  3.9 4.1  PROT 4.4* 4.6* 4.6*  ALBUMIN 1.5* 1.5* 1.4*  AST 87* 41* 25  ALT 183* 125* 84*  ALKPHOS 902* 799* 702*  BILITOT 4.5* 3.5* 2.8*  BILIDIR 3.9*  --   --   IBILI 0.6  --   --   12/25: Ca corrects to 10.68 Estimated Creatinine Clearance: 86.7 ml/min (by C-G formula based on Cr of 0.56).   Recent Labs  09/11/13 1743 09/11/13 2354 09/12/13 0557  GLUCAP 129* 119* 125*   Insulin Requirements in the past 24 hours:  2 units Novolog, on sensitive SSI q6hr   Nutritional Goals:  Re-estimated needs from 12/24 RD note: Kcal: 1820-2080  Protein: 90-105g Fluid: daily Clinimix E 5/15 at 79ml/hr + 20% fat emulsion at 19ml/hr will provide 90 grams protein and 1758 kcal/day.  Current Nutrition: NPO Continuous TNA Clinimix E 5/15 at 75 mL/hr + 20% fat emulsion at 10 mL/hr MIVF: NS + KCl 40 mEq/L at 20 ml/hr Vital AF 1.2 via Jtube at 63ml/hr started 12/24  Assessment: 57yo M with gastric outlet obstruction due to gastric mass.  Biopsy showed gastric carcinoma, diffuse type. Unable to place post-pyloric tube for enteral feedings; parenteral  nutrition started 12/5.  12/11: TNA changed to cyclic in case LFT elevation was due to cont dextrose infusion. 12/14: LFTs improved (except Alk phos), there are other possible causes for LFT elevation 12/14: switched back to continuous TNA due to some hypoglycemia in the 6 hours TNA was off each day 12/16: NGT replaced 12/15- continues with 1.1L NGT output past 24h, MD to recheck pre-albumin on 12/18 with possible surgery 12/18 or 12/19 12/19-21: Bilirubin increased, AST/ALT 3x ULN, Alk Phos 8xULN. Likely multi-factorial with TNA (elevation occurred more rapidly than usual), possible obstruction, previous hx of ETOH use, remote possibility liver mets (none seen on CT 11/25) 12/22: Surgery today, with J tube placement. Continues with > 1L NGT output daily. Pt will need TNA post-surgery until bowel function returns and pt is able to tolerate enteral feeding per discussion with Surgery PA-C this AM 12/23: Unable to resect large tumor burden in abdomen. J-tube and G-tube placed. Decreased NGT output 12/24: Trickle feeds started via Jtube, plan slow advancement over next few days  Labs:    Electrolytes: Stable (Na just below normal at 134) with KCl in maintenance IVF.  Renal: SCr wnl and stable.  LFT's: transaminases, alk phos, tbili elevated but improving now  Glucose: CBGs at goal (< 150)    TG: 76 (12/22),  74 (12/15), 61(12/8)  Pre-albumin: 13.2 (12/22), 13.8 (12/18), 11.0 (12/15), 9.7(12/11)  Plan:   Continue Clinimix E 5/15 at goal rate 61ml/hr   Continue 20% Fat Emulsion at 28ml/hr  TNA to contain standard MVI and trace elements daily.  Continue NS with KCl 40 mEq at 20 ml/hr to deliver ~20 mEq KCl over 24h  Await assessment of trickle feed tolerance/advancement before beginning TNA wean  TNA labs on Mondays and Thursdays  Continue sensitive-scale SSI q6hr   Loralee Pacas, PharmD, BCPS Pager: [phone] 09/12/2013 7:16 AM

## 2013-09-12 NOTE — Progress Notes (Signed)
Tube feeding increased to 30 ml /hr at 1230 , tolerating well so far, no residual.

## 2013-09-12 NOTE — Progress Notes (Signed)
Patient ID: Randy Le, male   DOB: 1956-09-04, 57 y.o.   MRN: [id number] 3 Days Post-Op  Subjective: Some pain, not severe. No nausea.  Tol TF at 20cc/hr  Objective: Vital signs in last 24 hours: Temp:  [97.2 F (36.2 C)-98.6 F (37 C)] 97.9 F (36.6 C) (12/25 0600) Pulse Rate:  [84-88] 88 (12/25 0600) Resp:  [11-24] 14 (12/25 0819) BP: (96-110)/(55-66) 106/59 mmHg (12/25 0600) SpO2:  [94 %-100 %] 94 % (12/25 0819) FiO2 (%):  [94 %] 94 % (12/25 0819) Weight:  [129 lb 3 oz (58.6 kg)] 129 lb 3 oz (58.6 kg) (12/25 0720) Last BM Date: 09/06/13  Intake/Output from previous day: 12/24 0701 - 12/25 0700 In: 0  Out: 2400 [Urine:1025; Drains:1375] Intake/Output this shift: Total I/O In: 240 [P.O.:240] Out: -   General appearance: alert, cooperative and no distress GI: normal findings: soft, non-tender Incision/Wound: Clean and dry  Lab Results:   Recent Labs  09/10/13 0500 09/11/13 0505  WBC 8.5 8.9  HGB 9.7* 9.1*  HCT 29.3* 27.3*  PLT 316 327   BMET  Recent Labs  09/11/13 0505 09/12/13 0530  NA 134* 135  K 4.0 3.8  CL 102 102  CO2 28 27  GLUCOSE 122* 118*  BUN 16 15  CREATININE 0.59 0.56  CALCIUM 8.6 8.6     Studies/Results: No results found.  Anti-infectives: Anti-infectives   Start     Dose/Rate Route Frequency Ordered Stop   09/09/13 0600  [MAR Hold]  metroNIDAZOLE (FLAGYL) IVPB 500 mg     (On MAR Hold since 09/09/13 1255)  Comments:  Pharmacy may adjust dosing strength, interval, or rate of medication as needed for optimal therapy for the patient Send with patient on call to the OR.  Anesthesia to complete antibiotic administration <39min prior to incision per [facility].   500 mg 100 mL/hr over 60 Minutes Intravenous On call to O.R. 09/07/13 0756 09/09/13 1430   09/09/13 0000  [MAR Hold]  ceFAZolin (ANCEF) IVPB 2 g/50 mL premix     (On MAR Hold since 09/09/13 1255)   2 g 100 mL/hr over 30 Minutes Intravenous 60 min pre-op 09/07/13 0756  09/09/13 1415   08/23/13 1400  piperacillin-tazobactam (ZOSYN) IVPB 3.375 g  Status:  Discontinued     3.375 g 12.5 mL/hr over 240 Minutes Intravenous 3 times per day 08/23/13 1247 08/30/13 1038      Assessment/Plan: s/p Procedure(s): JEJUNOSTOMY GASTROSTOMY TUBE Stable  Increassing TF Will increase Fentanyl patch   LOS: 21 days    Maansi Wike T 09/12/2013

## 2013-09-13 LAB — GLUCOSE, CAPILLARY
Glucose-Capillary: 121 mg/dL — ABNORMAL HIGH (ref 70–99)
Glucose-Capillary: 118 mg/dL — ABNORMAL HIGH (ref 70–99)
Glucose-Capillary: 110 mg/dL — ABNORMAL HIGH (ref 70–99)

## 2013-09-13 MED ORDER — TRACE MINERALS CR-CU-F-FE-I-MN-MO-SE-ZN IV SOLN
INTRAVENOUS | Status: AC
  Administered 2013-09-13: 17:00:00 via INTRAVENOUS
  Filled 2013-09-13: qty 1000

## 2013-09-13 MED ORDER — FAT EMULSION 20 % IV EMUL
250.0000 mL | INTRAVENOUS | Status: AC
  Administered 2013-09-13: 250 mL via INTRAVENOUS
  Filled 2013-09-13: qty 250

## 2013-09-13 NOTE — Care Management Note (Signed)
Cm spoke with patient and spouse at bedside concerning pt's continued LOS. Cm informed pt and spouse pt's chart reviewed q 3 days for continued LOS in which pt has met due to level of intensity of services. Pt request HH services upon discharge. Pt provided with choice list for [facility] Per pt choice [facility] to provide services at dc. [facility] rep [employer] notified.    Roxy Manns Japhet Morgenthaler,MSN,RN [phone]

## 2013-09-13 NOTE — Progress Notes (Signed)
NUTRITION FOLLOW UP  Intervention:   - TPN wean per pharmacy - Continue Vital AF 1.2 @ 50 ml/hr via J tube and increase by 10 ml every 12 hours to goal rate of 60 ml/hr. At goal rate, tube feeding regimen will provide 1728 kcal, 108 grams of protein, and 1168 ml of free H2O. This will meet 95% of estimated energy needs and 103% of estimated protein needs.  - Once continuous infusions discontinue; recommend additional 100 ml free water flushes 6 times daily to provide a total of 1768 ml of free water daily.  - RD to continue to monitor  Nutrition Dx:   Inadequate oral intake related to inability to eat, altered GI funtion as evidenced by NPO status, NG tube placed for gastric decompression; ongoing  Goal:   Pt to meet >/= 90% of their estimated nutrition needs; being met  Monitor:   Nutrition Support, Labs, Weights   Assessment:   57 yo M with gastric outlet obstruction due to gastric mass. Biopsy showed gastric carcinoma, diffuse type. Unable to place post-pyloric tube for enteral feedings; parenteral nutrition started 12/5.  12/23: Pt now has G and J tube in place. RD consulted for TF management. Pt states he has not had anything to eat for 6 weeks now. He reports a 2 year history of diarrhea after having 2-3 feet of his small bowel removed 2 years ago. Recommend starting TF's using Vital AF 1.2 due to history of bowel surgery and diarrhea, and due to no PO intake for 6 weeks.   12/24: RD consulted for enteral/TF management: Start jejunostomy feeds today, 10-15 ml to start, we will advance slowly over the next few days and wean TNA. No new weights. Discussed with pt that TF will be advanced slowly and to let RN know if he feels nausea, pain, or distension.   12/26: Clinimix 5/15 @ Clinimix E 5/15 at 75 mL/hr + 20% fat emulsion at 10 mL/hr. Regimen delivers 90 grams protein and 1758 kcal/day. Pt is also receiving Vital AF 1.2 via J tube @ 50 ml per hour which provides 1440 kcal, 90 grams  protein, and 973 ml of free water. TPN plus TF combined are currently providing 3198 kcal and 180 grams protein meeting 176% of estimated energy needs and 200% of estimated protein needs. Per Pharmacy note, plan to wean TPN to 40 ml/hr and discontinuing to off in the AM if pt is able to tolerate Vital AF 1.2 at goal rate of 60 ml/hr.  Pt states his pain is mild as it is normally. He states he hasn't had a bowel movement yet. Pt's weight is trending up.   Blood glucose ranging 118 to 138 mg/dL Low hemoglobin Potassium, magnesium, and phosphorus are WNL Elevated  ALT, and Alk Phos; AST now WNL Elevated bilirubin  Height: Ht Readings from Last 1 Encounters:  09/11/13 5\' 8"  (1.727 m)    Weight Status:   Wt Readings from Last 1 Encounters:  09/13/13 135 lb 12.9 oz (61.6 kg)    Re-estimated needs:  Kcal: 1820-2080  Protein: 90-105 grams Fluid: 1.8 L daily   Skin: abdominal incision  Diet Order: NPO   Intake/Output Summary (Last 24 hours) at 09/13/13 1657 Last data filed at 09/13/13 1400  Gross per 24 hour  Intake 4744.09 ml  Output   2275 ml  Net 2469.09 ml    Last BM: 09/06/13   Labs:   Recent Labs Lab 09/09/13 0406 09/10/13 0500 09/11/13 0505 09/12/13 0530  NA 138 136 134* 135  K 3.9 3.9 4.0 3.8  CL 103 104 102 102  CO2 28 27 28 27   BUN 18 17 16 15   CREATININE 0.67 0.63 0.59 0.56  CALCIUM 8.6 8.3* 8.6 8.6  MG 2.0  --  1.9 1.9  PHOS 4.3  --  3.9 4.1  GLUCOSE 116* 126* 122* 118*    CBG (last 3)   Recent Labs  09/12/13 2349 09/13/13 0627 09/13/13 1217  GLUCAP 124* 121* 118*    Scheduled Meds: . antiseptic oral rinse  15 mL Mouth Rinse q12n4p  . chlorhexidine  15 mL Mouth Rinse BID  . enoxaparin (LOVENOX) injection  40 mg Subcutaneous Q24H  . feeding supplement (VITAL AF 1.2 CAL)  1,000 mL Per Tube Q24H  . fentaNYL  25 mcg Transdermal Q72H  . HYDROmorphone PCA 0.3 mg/mL   Intravenous Q4H  . insulin aspart  0-9 Units Subcutaneous Q6H  . lip balm   1 application Topical BID  . nicotine  14 mg Transdermal Daily  . pantoprazole (PROTONIX) IV  40 mg Intravenous Q12H  . sodium chloride  10-40 mL Intracatheter Q12H    Continuous Infusions: . Marland KitchenTPN (CLINIMIX-E) Adult 75 mL/hr at 09/12/13 1710   And  . fat emulsion 250 mL (09/12/13 1710)  . Marland KitchenTPN (CLINIMIX-E) Adult     And  . fat emulsion    . sodium chloride 0.9 % 1,000 mL with potassium chloride 40 mEq infusion 20 mL/hr at 09/11/13 2116    Ian Malkin RD, LDN Inpatient Clinical Dietitian Pager: [phone] After Hours Pager: [phone]

## 2013-09-13 NOTE — Progress Notes (Addendum)
Increased tube feeding to 40 ml/h. Patient with mild distention but nausea nor increased abd pain except soreness. No residual noted.

## 2013-09-13 NOTE — Progress Notes (Signed)
[facility] PROGRESS NOTE  Randy Le ZOX:[id number] DOB: [birth year] DOA: 08/22/2013 PCP: Kirk Ruths, MD  Brief narrative: 57 yo M with past medical history of HTN, COPD, tobacco abuse, presented initially to [facility] on 08/22/2013 with weight loss of 25 lbs over 5 weeks, poor po intake, abdominal pain and nausea. CT showed gastric antral wall thickening. EGD done 08/23/2013 was with findings of gastric outlet obstruction, linitis plastica or gastric lymphoma. Biopsy was non-confirmatory of carcinoma and pt subsequently underwent EUS 08/29/2013 with biopsy showing highly atypical malignant cells c/w diffuse type gastric carcinoma. Surgery and oncology were consulted and patient underwent exploratory surgery 09/09/13 with no possibility of resection or bypass due to significant tumor burden.   Assessment/Plan:   Principal Problem:  Abdominal pain secondary to gastric outlet obstruction due to gastric carcinoma  - as noted above EUS biopsy proven gastric carcinoma; status post exploratory laparotomy 09/09/2013, due to significant tumor burden there was no possibility of resection or bypass; now has feeding J tube and G tube for drainage. Tapering TNA and increasing tube feedings (now rate 40 cc/hr). May even try po liquids in am per surgery  - pain management with dilaudid PCA and fentanyl patch  - appreciate surgery following  Active problems:  Gastric carcinoma  - plan for chemotherapy outpatient  - will need to keep PICC line on discharge  Abnormal liver function tests  - worsening after TPN but now slow downward trend with AST now WNL and ALT 84 and ALP 702  Bilateral Lower Extremity Edema  - Likely related to malnutrition and hypoalbuminemia.  - improving  Hyponatremia  - Related to dehydration.  - Resolved.  Anemia of chronic disease  - related to gastric cancer  - hemoglobin stable at 9.1  Cough  - initially had cough which was likely due to aspiration pneumonia. Pt  has received zosyn for 7 days.  - resolved   Code Status: Full  Family Communication: wife bedside  Disposition Plan: home when stable   Consultants:  Surgery  GI  Oncology Procedures:  EGD 08/23/13  EUS 08/29/13  Exploratory laparotomy 09/09/2013 Antibiotics  Zosyn 08/23/2013 --> 08/30/2013    Manson Passey, MD  [facility] Pager [phone]  If 7PM-7AM, please contact night-coverage [email or web address] Password [email or web address] 09/13/2013, 7:30 AM   LOS: 22 days   HPI/Subjective: No acute overnight events.   Objective: Filed Vitals:   09/13/13 0221 09/13/13 0415 09/13/13 0533 09/13/13 0636  BP: 102/62   111/61  Pulse: 80 72 84 83  Temp: 98.7 F (37.1 C)   97.6 F (36.4 C)  TempSrc: Oral   Oral  Resp: 13 13 15 12   Height:      Weight:    61.6 kg (135 lb 12.9 oz)  SpO2: 100% 99% 99% 97%    Intake/Output Summary (Last 24 hours) at 09/13/13 0730 Last data filed at 09/13/13 8295  Gross per 24 hour  Intake 5054.09 ml  Output   1775 ml  Net 3279.09 ml    Exam:  General: Pt is alert, follows commands appropriately, not in acute distress  Cardiovascular: Regular rate and rhythm, S1/S2, no murmurs, no rubs, no gallops  Respiratory: Clear to auscultation bilaterally, no wheezing, no crackles, no rhonchi  Abdomen: Soft, tender in mid abdomen, non distended, bowel sounds present, has feeding tube and one for drainage  Extremities: No edema, pulses DP and PT palpable bilaterally  Neuro: Grossly nonfocal   Data Reviewed: Basic Metabolic Panel:  Recent Labs Lab  09/08/13 0440 09/09/13 0406 09/10/13 0500 09/11/13 0505 09/12/13 0530  NA 138 138 136 134* 135  K 3.8 3.9 3.9 4.0 3.8  CL 106 103 104 102 102  CO2 29 28 27 28 27   GLUCOSE 115* 116* 126* 122* 118*  BUN 18 18 17 16 15   CREATININE 0.67 0.67 0.63 0.59 0.56  CALCIUM 8.7 8.6 8.3* 8.6 8.6  MG  --  2.0  --  1.9 1.9  PHOS  --  4.3  --  3.9 4.1   Liver Function Tests:  Recent Labs Lab 09/08/13 0440  09/09/13 0406 09/10/13 0500 09/11/13 0505 09/12/13 0530  AST 102* 105* 87* 41* 25  ALT 185* 204* 183* 125* 84*  ALKPHOS 914* 964* 902* 799* 702*  BILITOT 2.5* 3.4* 4.5* 3.5* 2.8*  PROT 4.7* 4.8* 4.4* 4.6* 4.6*  ALBUMIN 1.7* 1.7* 1.5* 1.5* 1.4*   No results found for this basename: LIPASE, AMYLASE,  in the last 168 hours No results found for this basename: AMMONIA,  in the last 168 hours CBC:  Recent Labs Lab 09/09/13 0406 09/10/13 0500 09/11/13 0505  WBC 7.6 8.5 8.9  NEUTROABS 5.2  --   --   HGB 9.7* 9.7* 9.1*  HCT 29.4* 29.3* 27.3*  MCV 95.1 95.4 95.8  PLT 331 316 327   Cardiac Enzymes: No results found for this basename: CKTOTAL, CKMB, CKMBINDEX, TROPONINI,  in the last 168 hours BNP: No components found with this basename: POCBNP,  CBG:  Recent Labs Lab 09/12/13 0557 09/12/13 1205 09/12/13 1809 09/12/13 2349 09/13/13 0627  GLUCAP 125* 125* 138* 124* 121*    No results found for this or any previous visit (from the past 240 hour(s)).   Studies: No results found.  Scheduled Meds: . antiseptic oral rinse  15 mL Mouth Rinse q12n4p  . chlorhexidine  15 mL Mouth Rinse BID  . enoxaparin (LOVENOX) injection  40 mg Subcutaneous Q24H  . feeding supplement (VITAL AF 1.2 CAL)  1,000 mL Per Tube Q24H  . fentaNYL  25 mcg Transdermal Q72H  . HYDROmorphone PCA 0.3 mg/mL   Intravenous Q4H  . insulin aspart  0-9 Units Subcutaneous Q6H  . lip balm  1 application Topical BID  . nicotine  14 mg Transdermal Daily  . pantoprazole (PROTONIX) IV  40 mg Intravenous Q12H  . sodium chloride  10-40 mL Intracatheter Q12H   Continuous Infusions: . Marland KitchenTPN (CLINIMIX-E) Adult 75 mL/hr at 09/12/13 1710   And  . fat emulsion 250 mL (09/12/13 1710)  . sodium chloride 0.9 % 1,000 mL with potassium chloride 40 mEq infusion 20 mL/hr at 09/11/13 2116

## 2013-09-13 NOTE — Progress Notes (Signed)
4 Days Post-Op   Assessment: s/p Procedure(s): JEJUNOSTOMY GASTROSTOMY TUBE Patient Active Problem List   Diagnosis Date Noted  . Gastric cancer 09/07/2013  . Gastric outlet obstruction 08/28/2013  . Ascites 08/28/2013  . Protein-calorie malnutrition, severe 08/23/2013  . GERD (gastroesophageal reflux disease) 08/22/2013  . Weight loss, unintentional 08/22/2013  . Hyponatremia 08/22/2013  . Dehydration 08/22/2013  . Tobacco abuse 08/22/2013  . COPD (chronic obstructive pulmonary disease) 08/22/2013  . Pedal edema 08/22/2013    Stablwe post op with gradually increasing Tube feeds  Plan: Try to advance to goal TF (60cc/hour per RD note a few days ago). Should be able to wean TNA as TF inmcreases. May be able to allow some liquids PO tomorrow - they will drain out G-Tube but may give him some comfort.  Subjective: Feels OK, no BM or gas, no nausea as TF up to 40 cc hour. Overall believes he is improving and hopes to go home soon.  Objective: Vital signs in last 24 hours: Temp:  [97.4 F (36.3 C)-98.7 F (37.1 C)] 97.6 F (36.4 C) (12/26 0636) Pulse Rate:  [72-84] 83 (12/26 0636) Resp:  [12-20] 12 (12/26 0636) BP: (101-132)/(53-70) 111/61 mmHg (12/26 0636) SpO2:  [94 %-100 %] 97 % (12/26 0636) FiO2 (%):  [21 %-98 %] 21 % (12/26 0533) Weight:  [135 lb 12.9 oz (61.6 kg)] 135 lb 12.9 oz (61.6 kg) (12/26 0636)   Intake/Output from previous day: 12/25 0701 - 12/26 0700 In: 5294.1 [P.O.:1200; I.V.:606.5; NG/GT:716.5; TPN:2081.1] Out: 1775 [Urine:375; Drains:1400]  General appearance: alert, cooperative and no distress GI: Soft, mild incisional tenderness, BS+.  Incision: Dressing dry, did not change this am  Lab Results:   Recent Labs  09/11/13 0505  WBC 8.9  HGB 9.1*  HCT 27.3*  PLT 327   BMET  Recent Labs  09/11/13 0505 09/12/13 0530  NA 134* 135  K 4.0 3.8  CL 102 102  CO2 28 27  GLUCOSE 122* 118*  BUN 16 15  CREATININE 0.59 0.56  CALCIUM 8.6 8.6     MEDS, Scheduled . antiseptic oral rinse  15 mL Mouth Rinse q12n4p  . chlorhexidine  15 mL Mouth Rinse BID  . enoxaparin (LOVENOX) injection  40 mg Subcutaneous Q24H  . feeding supplement (VITAL AF 1.2 CAL)  1,000 mL Per Tube Q24H  . fentaNYL  25 mcg Transdermal Q72H  . HYDROmorphone PCA 0.3 mg/mL   Intravenous Q4H  . insulin aspart  0-9 Units Subcutaneous Q6H  . lip balm  1 application Topical BID  . nicotine  14 mg Transdermal Daily  . pantoprazole (PROTONIX) IV  40 mg Intravenous Q12H  . sodium chloride  10-40 mL Intracatheter Q12H    Studies/Results: No results found.    LOS: 22 days     Currie Paris, MD, [facility], [place] [phone]   09/13/2013 7:40 AM

## 2013-09-13 NOTE — Progress Notes (Addendum)
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  No Known Allergies  Patient Measurements: Height: 5\' 8"  (172.7 cm) Weight: 135 lb 12.9 oz (61.6 kg) IBW/kg (Calculated) : 68.4  Vital Signs: Temp: 97.6 F (36.4 C) (12/26 0636) Temp src: Oral (12/26 0636) BP: 111/61 mmHg (12/26 0636) Pulse Rate: 83 (12/26 0636)  Intake/Output from previous day: 12/25 0701 - 12/26 0700 In: 5294.1 [P.O.:1200; I.V.:606.5; NG/GT:716.5; TPN:2081.1] Out: 1775 [Urine:375; Drains:1400]  Labs:  Recent Labs  09/11/13 0505  WBC 8.9  HGB 9.1*  HCT 27.3*  PLT 327    Recent Labs  09/11/13 0505 09/12/13 0530  NA 134* 135  K 4.0 3.8  CL 102 102  CO2 28 27  GLUCOSE 122* 118*  BUN 16 15  CREATININE 0.59 0.56  CALCIUM 8.6 8.6  MG 1.9 1.9  PHOS 3.9 4.1  PROT 4.6* 4.6*  ALBUMIN 1.5* 1.4*  AST 41* 25  ALT 125* 84*  ALKPHOS 799* 702*  BILITOT 3.5* 2.8*  12/25: Ca corrects to 10.68 Estimated Creatinine Clearance: 87.7 ml/min (by C-G formula based on Cr of 0.56).   Recent Labs  09/12/13 1809 09/12/13 2349 09/13/13 0627  GLUCAP 138* 124* 121*   Insulin Requirements in the past 24 hours:  2 units Novolog, on sensitive SSI q6hr   Nutritional Goals:  Re-estimated needs from 12/24 RD note: Kcal: 1820-2080  Protein: 90-105g Fluid: daily Clinimix E 5/15 at 8ml/hr + 20% fat emulsion at 44ml/hr will provide 90 grams protein and 1758 kcal/day.  Current Nutrition: Continuous TNA Clinimix E 5/15 at 75 mL/hr + 20% fat emulsion at 10 mL/hr MIVF: NS + KCl 40 mEq/L at 20 ml/hr Vital AF 1.2 via Jtube at 82ml/hr started 12/24 - Goal is 60 ml/hr, currently running at 40 ml/hr with plans to advance to 50 ml/hr later today and be at goal by tonight.   Assessment: 57yo M with gastric outlet obstruction due to gastric mass.  Biopsy showed gastric carcinoma, diffuse type. Unable to place post-pyloric tube for enteral feedings; parenteral  nutrition started 12/5.  12/11: TNA changed to cyclic in case LFT elevation was due to cont dextrose infusion. 12/14: LFTs improved (except Alk phos), there are other possible causes for LFT elevation 12/14: switched back to continuous TNA due to some hypoglycemia in the 6 hours TNA was off each day 12/16: NGT replaced 12/15- continues with 1.1L NGT output past 24h, MD to recheck pre-albumin on 12/18 with possible surgery 12/18 or 12/19 12/19-21: Bilirubin increased, AST/ALT 3x ULN, Alk Phos 8xULN. Likely multi-factorial with TNA (elevation occurred more rapidly than usual), possible obstruction, previous hx of ETOH use, remote possibility liver mets (none seen on CT 11/25) 12/22: Surgery today, with J tube placement. Continues with > 1L NGT output daily. Pt will need TNA post-surgery until bowel function returns and pt is able to tolerate enteral feeding per discussion with Surgery PA-C this AM 12/23: Unable to resect large tumor burden in abdomen. J-tube and G-tube placed. Decreased NGT output 12/24: Trickle feeds started via Jtube, plan slow advancement over next few days 12/26: Vital AF 1.2 Cal is currently running at 40 ml/hr, Goal TF rate is 60 ml/hr.  Surgery notes state can wean TNA as patient tolerates TF and rate is increased.  Attempted to page surgery today for wean TNA to off today vs. Wean to half goal rate until tomorrow.    Labs from 12/25  Electrolytes: WNL   Renal: SCr  wnl and stable.  LFT's: transaminases, alk phos, tbili elevated but improving now  Glucose: CBGs at goal (< 150)    TG: 76 (12/22), 74 (12/15), 61(12/8)  Pre-albumin: 13.2 (12/22), 13.8 (12/18), 11.0 (12/15), 9.7(12/11)  Plan:   Will wean TNA to 40 ml/hr.  Recommend discontinuing to off in AM if patient is tolerating TF at goal rate.  Surgery please clarify in orders when okay to stop TNA.    Continue 20% Fat Emulsion at 26ml/hr  TNA to contain standard MVI and trace elements daily.  Continue NS with  KCl 40 mEq at 20 ml/hr to deliver ~20 mEq KCl over 24h -- will defer to MD for adjustment of MIVF  Await assessment of trickle feed tolerance/advancement before beginning TNA wean  TNA labs on Mondays and Thursdays.  BMET in am  Continue sensitive-scale SSI q6hr  Clydene Fake PharmD Pager #: [phone] 10:05 AM 09/13/2013

## 2013-09-14 LAB — BASIC METABOLIC PANEL
BUN: 16 mg/dL (ref 6–23)
CO2: 28 mEq/L (ref 19–32)
Calcium: 8.4 mg/dL (ref 8.4–10.5)
Chloride: 103 mEq/L (ref 96–112)
GFR calc Af Amer: >90 mL/min (ref >90)
Glucose, Bld: 97 mg/dL (ref 70–99)
Sodium: 137 mEq/L (ref 135–145)

## 2013-09-14 LAB — GLUCOSE, CAPILLARY
Glucose-Capillary: 105 mg/dL — ABNORMAL HIGH (ref 70–99)
Glucose-Capillary: 90 mg/dL (ref 70–99)

## 2013-09-14 NOTE — Progress Notes (Addendum)
[facility] PROGRESS NOTE  Randy Le ZOX:[id number] DOB: 05/05/1956 DOA: 08/22/2013 PCP: Kirk Ruths, MD  Brief narrative: 57 yo M with past medical history of HTN, COPD, tobacco abuse, presented initially to [facility] on 08/22/2013 with weight loss of 25 lbs over 5 weeks, poor po intake, abdominal pain and nausea. CT showed gastric antral wall thickening. EGD done 08/23/2013 was with findings of gastric outlet obstruction, linitis plastica or gastric lymphoma. Biopsy was non-confirmatory of carcinoma and pt subsequently underwent EUS 08/29/2013 with biopsy showing highly atypical malignant cells c/w diffuse type gastric carcinoma. Surgery and oncology were consulted and patient underwent exploratory surgery 09/09/13 with no possibility of resection or bypass due to significant tumor burden. At this time we are tapering TNA and increasing tube feeds which are now at the goal rate of 60 cc/hr.   Assessment/Plan:   Principal Problem:  Abdominal pain secondary to gastric outlet obstruction due to gastric carcinoma  - EUS biopsy proven gastric carcinoma; status post exploratory laparotomy 09/09/2013, due to significant tumor burden there was no possibility of resection or bypass; now has feeding J tube and G tube for drainage. Tapering TNA and increasing tube feedings (now rate 60 cc/hr).  - pain management with dilaudid PCA and fentanyl patch  - appreciate surgery following   Active problems:  Gastric carcinoma  - plan for chemotherapy outpatient  - will need to keep PICC line on discharge  Abnormal liver function tests  - worsening after TPN but now slow downward trend with AST now WNL and ALT 84 and ALP 702  Bilateral Lower Extremity Edema  - Likely related to malnutrition and hypoalbuminemia.  - improved Hyponatremia  - Related to dehydration.  - Resolved.  Anemia of chronic disease  - related to gastric cancer  - hemoglobin stable at 9.1  Cough  - initially had cough  which was likely due to aspiration pneumonia. Pt has received zosyn for 7 days.  - resolved   Code Status: Full  Family Communication: no family at the bedside  Disposition Plan: home when stable    Consultants:  Surgery  GI  Oncology Procedures:  EGD 08/23/13  EUS 08/29/13  Exploratory laparotomy 09/09/2013 Antibiotics  Zosyn 08/23/2013 --> 08/30/2013   Manson Passey, MD  [facility] Pager [phone]  If 7PM-7AM, please contact night-coverage [email or web address] Password [email or web address] 09/14/2013, 12:01 PM   LOS: 23 days     HPI/Subjective: No overnight events. Tolerates tube feeds well.  Objective: Filed Vitals:   09/14/13 0420 09/14/13 0543 09/14/13 0620 09/14/13 0826  BP:   108/61   Pulse:  82 78   Temp:   97.9 F (36.6 C)   TempSrc:   Oral   Resp: 23 12 16 16   Height:      Weight:   61.2 kg (134 lb 14.7 oz)   SpO2: 99% 98% 97% 97%    Intake/Output Summary (Last 24 hours) at 09/14/13 1201 Last data filed at 09/14/13 8295  Gross per 24 hour  Intake 4088.46 ml  Output   2125 ml  Net 1963.46 ml    Exam:  General: Pt is alert, follows commands appropriately, not in acute distress  Cardiovascular: Regular rate and rhythm, S1/S2, no murmurs, no rubs, no gallops  Respiratory: Clear to auscultation bilaterally, no wheezing, no crackles, no rhonchi  Abdomen: Soft, tender in mid abdomen, non distended, bowel sounds present, has feeding tube and one for drainage  Extremities: No edema, pulses DP and PT palpable bilaterally  Neuro:  Grossly nonfocal   Data Reviewed: Basic Metabolic Panel:  Recent Labs Lab 09/09/13 0406 09/10/13 0500 09/11/13 0505 09/12/13 0530 09/14/13 0540  NA 138 136 134* 135 137  K 3.9 3.9 4.0 3.8 3.9  CL 103 104 102 102 103  CO2 28 27 28 27 28   GLUCOSE 116* 126* 122* 118* 97  BUN 18 17 16 15 16   CREATININE 0.67 0.63 0.59 0.56 0.55  CALCIUM 8.6 8.3* 8.6 8.6 8.4  MG 2.0  --  1.9 1.9  --   PHOS 4.3  --  3.9 4.1  --    Liver Function  Tests:  Recent Labs Lab 09/08/13 0440 09/09/13 0406 09/10/13 0500 09/11/13 0505 09/12/13 0530  AST 102* 105* 87* 41* 25  ALT 185* 204* 183* 125* 84*  ALKPHOS 914* 964* 902* 799* 702*  BILITOT 2.5* 3.4* 4.5* 3.5* 2.8*  PROT 4.7* 4.8* 4.4* 4.6* 4.6*  ALBUMIN 1.7* 1.7* 1.5* 1.5* 1.4*   No results found for this basename: LIPASE, AMYLASE,  in the last 168 hours No results found for this basename: AMMONIA,  in the last 168 hours CBC:  Recent Labs Lab 09/09/13 0406 09/10/13 0500 09/11/13 0505  WBC 7.6 8.5 8.9  NEUTROABS 5.2  --   --   HGB 9.7* 9.7* 9.1*  HCT 29.4* 29.3* 27.3*  MCV 95.1 95.4 95.8  PLT 331 316 327   Cardiac Enzymes: No results found for this basename: CKTOTAL, CKMB, CKMBINDEX, TROPONINI,  in the last 168 hours BNP: No components found with this basename: POCBNP,  CBG:  Recent Labs Lab 09/13/13 0627 09/13/13 1217 09/13/13 1813 09/13/13 2357 09/14/13 0614  GLUCAP 121* 118* 110* 113* 106*    No results found for this or any previous visit (from the past 240 hour(s)).   Studies: No results found.  Scheduled Meds: . antiseptic oral rinse  15 mL Mouth Rinse q12n4p  . chlorhexidine  15 mL Mouth Rinse BID  . enoxaparin (LOVENOX) injection  40 mg Subcutaneous Q24H  . feeding supplement (VITAL AF 1.2 CAL)  1,000 mL Per Tube Q24H  . fentaNYL  25 mcg Transdermal Q72H  . HYDROmorphone PCA 0.3 mg/mL   Intravenous Q4H  . insulin aspart  0-9 Units Subcutaneous Q6H  . lip balm  1 application Topical BID  . nicotine  14 mg Transdermal Daily  . pantoprazole (PROTONIX) IV  40 mg Intravenous Q12H  . sodium chloride  10-40 mL Intracatheter Q12H   Continuous Infusions: . Marland KitchenTPN (CLINIMIX-E) Adult 40 mL/hr at 09/13/13 1709   And  . fat emulsion 250 mL (09/13/13 1709)  . sodium chloride 0.9 % 1,000 mL with potassium chloride 40 mEq infusion 20 mL/hr at 09/14/13 0420

## 2013-09-14 NOTE — Progress Notes (Signed)
General Surgery Note  LOS: 23 days  POD -  5 Days Post-Op  Assessment/Plan: 1.  Open JEJUNOSTOMY/GASTROSTOMY TUBE for advanced unresectable diffuse type gastric cancer - D. Bijan Ridgley - 09/09/2013  Tube feedings at 60 cc/hr (target), TPN being weaned.  Appears to be aiming to go home Monday.  Would leave staple about 2 weeks.  I can see him in the office in 7-10 days on discharge.  He has my office phone number.  2.  DVT prophylaxis - Lovenox 3.  Severe cal/protein malnutrition   Principal Problem:   Gastric cancer Active Problems:   GERD (gastroesophageal reflux disease)   Weight loss, unintentional   Hyponatremia   Dehydration   Tobacco abuse   COPD (chronic obstructive pulmonary disease)   Pedal edema   Protein-calorie malnutrition, severe   Gastric outlet obstruction   Ascites  Subjective:  Doing okay.  No nausea.  Seems to be tolerating TF, but no BM yet. Objective:   Filed Vitals:   09/14/13 0620  BP: 108/61  Pulse: 78  Temp: 97.9 F (36.6 C)  Resp: 16     Intake/Output from previous day:  12/26 0701 - 12/27 0700 In: 4328.5 [P.O.:950; I.V.:563.6; NG/GT:1173.2; TPN:1536.7] Out: 2525 [Urine:875; Drains:1650]  Intake/Output this shift:      Physical Exam:   General: Thin WM who is alert and oriented.    HEENT: Normal. Pupils equal. .   Lungs: Clear   Abdomen: Still tender, but getting better.   Wound: Clean.  G tube LUQ and J tube LLQ.   Lab Results:   No results found for this basename: WBC, HGB, HCT, PLT,  in the last 72 hours  BMET   Recent Labs  09/12/13 0530 09/14/13 0540  NA 135 137  K 3.8 3.9  CL 102 103  CO2 27 28  GLUCOSE 118* 97  BUN 15 16  CREATININE 0.56 0.55  CALCIUM 8.6 8.4    PT/INR  No results found for this basename: LABPROT, INR,  in the last 72 hours  ABG  No results found for this basename: PHART, PCO2, PO2, HCO3,  in the last 72 hours   Studies/Results:  No results found.   Anti-infectives:   Anti-infectives   Start      Dose/Rate Route Frequency Ordered Stop   09/09/13 0600  [MAR Hold]  metroNIDAZOLE (FLAGYL) IVPB 500 mg     (On MAR Hold since 09/09/13 1255)  Comments:  Pharmacy may adjust dosing strength, interval, or rate of medication as needed for optimal therapy for the patient Send with patient on call to the OR.  Anesthesia to complete antibiotic administration <60min prior to incision per [facility].   500 mg 100 mL/hr over 60 Minutes Intravenous On call to O.R. 09/07/13 0756 09/09/13 1430   09/09/13 0000  [MAR Hold]  ceFAZolin (ANCEF) IVPB 2 g/50 mL premix     (On MAR Hold since 09/09/13 1255)   2 g 100 mL/hr over 30 Minutes Intravenous 60 min pre-op 09/07/13 0756 09/09/13 1415   08/23/13 1400  piperacillin-tazobactam (ZOSYN) IVPB 3.375 g  Status:  Discontinued     3.375 g 12.5 mL/hr over 240 Minutes Intravenous 3 times per day 08/23/13 1247 08/30/13 1038      Ovidio Kin, MD, FACS Pager: [phone] [facility] Office: [phone] 09/14/2013

## 2013-09-14 NOTE — Progress Notes (Signed)
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  No Known Allergies  Patient Measurements: Height: 5\' 8"  (172.7 cm) Weight: 134 lb 14.7 oz (61.2 kg) IBW/kg (Calculated) : 68.4  Vital Signs: Temp: 97.9 F (36.6 C) (12/27 0620) Temp src: Oral (12/27 0620) BP: 108/61 mmHg (12/27 0620) Pulse Rate: 78 (12/27 0620)  Intake/Output from previous day: 12/26 0701 - 12/27 0700 In: 4328.5 [P.O.:950; I.V.:563.6; NG/GT:1173.2; TPN:1536.7] Out: 2525 [Urine:875; Drains:1650]  Labs: No results found for this basename: WBC, HGB, HCT, PLT, APTT, INR,  in the last 72 hours  Recent Labs  09/12/13 0530 09/14/13 0540  NA 135 137  K 3.8 3.9  CL 102 103  CO2 27 28  GLUCOSE 118* 97  BUN 15 16  CREATININE 0.56 0.55  CALCIUM 8.6 8.4  MG 1.9  --   PHOS 4.1  --   PROT 4.6*  --   ALBUMIN 1.4*  --   AST 25  --   ALT 84*  --   ALKPHOS 702*  --   BILITOT 2.8*  --   12/25: Ca corrects to 10.68 Estimated Creatinine Clearance: 87.1 ml/min (by C-G formula based on Cr of 0.55).   Recent Labs  09/13/13 1813 09/13/13 2357 09/14/13 0614  GLUCAP 110* 113* 106*   Insulin Requirements in the past 24 hours:  2 units Novolog, on sensitive SSI q6hr   Nutritional Goals:  Re-estimated needs from 12/24 RD note: Kcal: 1820-2080  Protein: 90-105g Fluid: daily Clinimix E 5/15 at 103ml/hr + 20% fat emulsion at 82ml/hr will provide 90 grams protein and 1758 kcal/day.  Current Nutrition: Continuous TNA Clinimix E 5/15 at 75 mL/hr + 20% fat emulsion at 10 mL/hr MIVF: NS + KCl 40 mEq/L at 20 ml/hr Vital AF 1.2 via Jtube now at goal 60 ml/hr since last pm  Assessment: 57yo M with gastric outlet obstruction due to gastric mass.  Biopsy showed gastric carcinoma, diffuse type. Unable to place post-pyloric tube for enteral feedings; parenteral nutrition started 12/5.  12/11: TNA changed to cyclic in case LFT elevation was due to cont  dextrose infusion. 12/14: LFTs improved (except Alk phos), there are other possible causes for LFT elevation 12/14: switched back to continuous TNA due to some hypoglycemia in the 6 hours TNA was off each day 12/16: NGT replaced 12/15- continues with 1.1L NGT output past 24h, MD to recheck pre-albumin on 12/18 with possible surgery 12/18 or 12/19 12/19-21: Bilirubin increased, AST/ALT 3x ULN, Alk Phos 8xULN. Likely multi-factorial with TNA (elevation occurred more rapidly than usual), possible obstruction, previous hx of ETOH use, remote possibility liver mets (none seen on CT 11/25) 12/22: Surgery today, with J tube placement. Continues with > 1L NGT output daily. Pt will need TNA post-surgery until bowel function returns and pt is able to tolerate enteral feeding per discussion with Surgery PA-C this AM 12/23: Unable to resect large tumor burden in abdomen. J-tube and G-tube placed. Decreased NGT output 12/24: Trickle feeds started via Jtube, plan slow advancement over next few days 12/26: Vital AF 1.2 Cal is currently running at 40 ml/hr, Goal TF rate is 60 ml/hr.  Surgery notes state can wean TNA as patient tolerates TF and rate is increased.  Attempted to page surgery today for wean TNA to off today vs. Wean to half goal rate until tomorrow.   12/27: Is now tolerating TF at goal, will dc TNA at 6p tonight   Electrolytes: WNL  Renal: SCr wnl and stable.  LFT's: transaminases, alk phos, tbili elevated but improving now  Glucose: CBGs at goal (< 150)    TG: 76 (12/22), 74 (12/15), 61(12/8)  Pre-albumin: 13.2 (12/22), 13.8 (12/18), 11.0 (12/15), 9.7(12/11)  Plan:   DC TNA at 6pm tonight  DC TNA labs   Loralee Pacas, PharmD, BCPS Pager: [phone] 9:10 AM 09/14/2013

## 2013-09-15 LAB — GLUCOSE, CAPILLARY
Glucose-Capillary: 105 mg/dL — ABNORMAL HIGH (ref 70–99)
Glucose-Capillary: 84 mg/dL (ref 70–99)

## 2013-09-15 MED ORDER — HYDROMORPHONE HCL PF 2 MG/ML IJ SOLN
2.0000 mg | INTRAMUSCULAR | Status: DC | PRN
  Administered 2013-09-15 – 2013-09-16 (×2): 2 mg via INTRAVENOUS
  Filled 2013-09-15 (×2): qty 1

## 2013-09-15 MED ORDER — OXYCODONE HCL 5 MG/5ML PO SOLN
5.0000 mg | ORAL | Status: DC | PRN
  Administered 2013-09-15: 5 mg
  Filled 2013-09-15: qty 5

## 2013-09-15 MED ORDER — HYDROMORPHONE HCL PF 1 MG/ML IJ SOLN
1.0000 mg | INTRAMUSCULAR | Status: DC | PRN
  Administered 2013-09-15: 2 mg via INTRAVENOUS
  Filled 2013-09-15: qty 2

## 2013-09-15 MED ORDER — HYDROMORPHONE HCL PF 1 MG/ML IJ SOLN: 1.0000 mg | Freq: Four times a day (QID) | INTRAMUSCULAR | Status: DC | PRN

## 2013-09-15 MED ORDER — FENTANYL 50 MCG/HR TD PT72: 50.0000 ug | MEDICATED_PATCH | TRANSDERMAL | Status: DC

## 2013-09-15 MED ORDER — OXYCODONE HCL 5 MG/5ML PO SOLN
10.0000 mg | ORAL | Status: DC | PRN
  Administered 2013-09-15: 10 mg
  Filled 2013-09-15 (×2): qty 10

## 2013-09-15 NOTE — Progress Notes (Signed)
[facility] PROGRESS NOTE  ENIO HORNBACK WUJ:[id number] DOB: 1955-11-18 DOA: 08/22/2013 PCP: Kirk Ruths, MD  Brief narrative: 57 yo M with past medical history of HTN, COPD, tobacco abuse, presented initially to [facility] on 08/22/2013 with weight loss of 25 lbs over 5 weeks, poor po intake, abdominal pain and nausea. CT showed gastric antral wall thickening. EGD done 08/23/2013 was with findings of gastric outlet obstruction, linitis plastica or gastric lymphoma. Biopsy was non-confirmatory of carcinoma and pt subsequently underwent EUS 08/29/2013 with biopsy showing highly atypical malignant cells c/w diffuse type gastric carcinoma. Surgery and oncology were consulted and patient underwent exploratory surgery 09/09/13 with no possibility of resection or bypass due to significant tumor burden. At this time we are tapering TNA. Tube feeds are at the goal rate of 60 cc/hr. Plan is for discharge home tomorrow.   Assessment/Plan:   Principal Problem:  Abdominal pain secondary to gastric outlet obstruction due to gastric carcinoma  - EUS biopsy proven gastric carcinoma; status post exploratory laparotomy 09/09/2013, due to significant tumor burden there was no possibility of resection or bypass; now has feeding J tube and G tube for drainage. Tapering TNA and increasing tube feedings (now rate 60 cc/hr).  - pain management with dilaudid PCA, breakthrough IV dilaudid PRN and fentanyl patch; also on oxycodone for moderate pain via feeding tube. Will see which regimen will work out at the time of discharge  - appreciate surgery following  Active problems:  Gastric carcinoma  - plan for chemotherapy outpatient  - will need to keep PICC line on discharge  Abnormal liver function tests  - worsening after TPN but now slow downward trend with AST now WNL and ALT 84 and ALP 702  Bilateral Lower Extremity Edema  - Likely related to malnutrition and hypoalbuminemia.  - improved  Hyponatremia  -  Related to dehydration.  - Resolved.  Anemia of chronic disease  - related to gastric cancer  - hemoglobin stable at 9.1  Cough  - initially had cough which was likely due to aspiration pneumonia. Pt has received zosyn for 7 days.  - resolved   Code Status: Full  Family Communication: no family at the bedside  Disposition Plan: home when stable; likely in am  Consultants:  Surgery  GI  Oncology Procedures:  EGD 08/23/13  EUS 08/29/13  Exploratory laparotomy 09/09/2013 Antibiotics  Zosyn 08/23/2013 --> 08/30/2013   Manson Passey, MD  [facility] Pager [phone]  If 7PM-7AM, please contact night-coverage [email or web address] Password [email or web address] 09/15/2013, 10:06 AM   LOS: 24 days    HPI/Subjective: No overnight events.   Objective: Filed Vitals:   09/15/13 0204 09/15/13 0400 09/15/13 0540 09/15/13 0743  BP: 105/51  109/63   Pulse: 75  77   Temp: 98.1 F (36.7 C)  98.5 F (36.9 C)   TempSrc: Axillary  Axillary   Resp: 13 18 18 13   Height:      Weight:   61.4 kg (135 lb 5.8 oz)   SpO2: 94% 98% 95% 94%    Intake/Output Summary (Last 24 hours) at 09/15/13 1006 Last data filed at 09/15/13 0545  Gross per 24 hour  Intake    240 ml  Output   2250 ml  Net  -2010 ml    Exam:   General:  Pt is sleeping, no acute distress  Cardiovascular: Regular rate and rhythm, S1/S2 appreciated  Respiratory: Clear to auscultation bilaterally, no wheezing, no crackles, no rhonchi  Abdomen: Soft, some tenderness in mid  abdomen; feeding tube and drainage tube  Extremities: No edema, pulses DP and PT palpable bilaterally  Neuro: Grossly nonfocal  Data Reviewed: Basic Metabolic Panel:  Recent Labs Lab 09/09/13 0406 09/10/13 0500 09/11/13 0505 09/12/13 0530 09/14/13 0540  NA 138 136 134* 135 137  K 3.9 3.9 4.0 3.8 3.9  CL 103 104 102 102 103  CO2 28 27 28 27 28   GLUCOSE 116* 126* 122* 118* 97  BUN 18 17 16 15 16   CREATININE 0.67 0.63 0.59 0.56 0.55  CALCIUM 8.6  8.3* 8.6 8.6 8.4  MG 2.0  --  1.9 1.9  --   PHOS 4.3  --  3.9 4.1  --    Liver Function Tests:  Recent Labs Lab 09/09/13 0406 09/10/13 0500 09/11/13 0505 09/12/13 0530  AST 105* 87* 41* 25  ALT 204* 183* 125* 84*  ALKPHOS 964* 902* 799* 702*  BILITOT 3.4* 4.5* 3.5* 2.8*  PROT 4.8* 4.4* 4.6* 4.6*  ALBUMIN 1.7* 1.5* 1.5* 1.4*   No results found for this basename: LIPASE, AMYLASE,  in the last 168 hours No results found for this basename: AMMONIA,  in the last 168 hours CBC:  Recent Labs Lab 09/09/13 0406 09/10/13 0500 09/11/13 0505  WBC 7.6 8.5 8.9  NEUTROABS 5.2  --   --   HGB 9.7* 9.7* 9.1*  HCT 29.4* 29.3* 27.3*  MCV 95.1 95.4 95.8  PLT 331 316 327   Cardiac Enzymes: No results found for this basename: CKTOTAL, CKMB, CKMBINDEX, TROPONINI,  in the last 168 hours BNP: No components found with this basename: POCBNP,  CBG:  Recent Labs Lab 09/14/13 0614 09/14/13 1307 09/14/13 1803 09/14/13 2341 09/15/13 0636  GLUCAP 106* 105* 113* 90 81    No results found for this or any previous visit (from the past 240 hour(s)).   Studies: No results found.  Scheduled Meds: . enoxaparin (LOVENOX)   40 mg Subcutaneous Q24H  . feeding supplement (VITAL AF 1.2 CAL)  1,000 mL Per Tube Q24H  . fentaNYL  25 mcg Transdermal Q72H  . HYDROmorphone PCA 0.3 mg/mL   Intravenous Q4H  . insulin aspart  0-9 Units Subcutaneous Q6H  . nicotine  14 mg Transdermal Daily  . pantoprazole   40 mg Intravenous Q12H   Continuous Infusions: . sodium chloride 0.9 % 1,000 mL with potassium chloride 40 mEq infusion 20 mL/hr at 09/14/13 0420

## 2013-09-15 NOTE — Progress Notes (Signed)
General Surgery Note  LOS: 24 days  POD -  6 Days Post-Op  Assessment/Plan: 1.  Open JEJUNOSTOMY/GASTROSTOMY TUBE for advanced unresectable diffuse type gastric cancer - D. Shaunna Rosetti - 09/09/2013  Tube feedings at 60 cc/hr (target).  Appears to be aiming to go home Monday.  Would leave staple about 2 weeks.  I can see him in the office in 7-10 days on discharge.  He has my office phone number.  Will try liquid oxycodone through J tube for pain control.  Looks good to me today.  2.  DVT prophylaxis - Lovenox 3.  Severe cal/protein malnutrition - now on J tube feedings.   Principal Problem:   Gastric cancer Active Problems:   GERD (gastroesophageal reflux disease)   Weight loss, unintentional   Hyponatremia   Dehydration   Tobacco abuse   COPD (chronic obstructive pulmonary disease)   Pedal edema   Protein-calorie malnutrition, severe   Gastric outlet obstruction   Ascites  Subjective:  Doing okay.  Had 2 BMs.  Feels okay with less pain.  Has questions about discharge.  Will work with patient today. Objective:   Filed Vitals:   09/15/13 0743  BP:   Pulse:   Temp:   Resp: 13     Intake/Output from previous day:  12/27 0701 - 12/28 0700 In: 480 [P.O.:480] Out: 2300 [Urine:1050; Drains:1250]  Intake/Output this shift:      Physical Exam:   General: Thin WM who is alert and oriented.    HEENT: Normal. Pupils equal. .   Lungs: Clear   Abdomen: Mild distention.  BS present.   Wound: Clean.  G tube LUQ and J tube LLQ.   Lab Results:   No results found for this basename: WBC, HGB, HCT, PLT,  in the last 72 hours  BMET    Recent Labs  09/14/13 0540  NA 137  K 3.9  CL 103  CO2 28  GLUCOSE 97  BUN 16  CREATININE 0.55  CALCIUM 8.4    PT/INR  No results found for this basename: LABPROT, INR,  in the last 72 hours  ABG  No results found for this basename: PHART, PCO2, PO2, HCO3,  in the last 72 hours   Studies/Results:  No results  found.   Anti-infectives:   Anti-infectives   Start     Dose/Rate Route Frequency Ordered Stop   09/09/13 0600  [MAR Hold]  metroNIDAZOLE (FLAGYL) IVPB 500 mg     (On MAR Hold since 09/09/13 1255)  Comments:  Pharmacy may adjust dosing strength, interval, or rate of medication as needed for optimal therapy for the patient Send with patient on call to the OR.  Anesthesia to complete antibiotic administration <50min prior to incision per [facility].   500 mg 100 mL/hr over 60 Minutes Intravenous On call to O.R. 09/07/13 0756 09/09/13 1430   09/09/13 0000  [MAR Hold]  ceFAZolin (ANCEF) IVPB 2 g/50 mL premix     (On MAR Hold since 09/09/13 1255)   2 g 100 mL/hr over 30 Minutes Intravenous 60 min pre-op 09/07/13 0756 09/09/13 1415   08/23/13 1400  piperacillin-tazobactam (ZOSYN) IVPB 3.375 g  Status:  Discontinued     3.375 g 12.5 mL/hr over 240 Minutes Intravenous 3 times per day 08/23/13 1247 08/30/13 1038      Ovidio Kin, MD, FACS Pager: [phone] [facility] Office: [phone] 09/15/2013

## 2013-09-16 ENCOUNTER — Other Ambulatory Visit: Payer: Self-pay | Admitting: *Deleted

## 2013-09-16 LAB — GLUCOSE, CAPILLARY: Glucose-Capillary: 100 mg/dL — ABNORMAL HIGH (ref 70–99)

## 2013-09-16 MED ORDER — CHLORHEXIDINE GLUCONATE 0.12 % MT SOLN: 15.0000 mL | Freq: Two times a day (BID) | OROMUCOSAL | Status: DC

## 2013-09-16 MED ORDER — ONDANSETRON HCL 4 MG PO TABS: 4.0000 mg | ORAL_TABLET | Freq: Four times a day (QID) | ORAL | Status: DC | PRN

## 2013-09-16 MED ORDER — FENTANYL 50 MCG/HR TD PT72: 50.0000 ug | MEDICATED_PATCH | TRANSDERMAL | Status: DC

## 2013-09-16 MED ORDER — VITAL AF 1.2 CAL PO LIQD: 1000.0000 mL | ORAL | Status: DC

## 2013-09-16 MED ORDER — PHENOL 1.4 % MT LIQD: 1.0000 | OROMUCOSAL | Status: DC | PRN

## 2013-09-16 MED ORDER — MENTHOL 3 MG MT LOZG: 1.0000 | LOZENGE | OROMUCOSAL | Status: DC | PRN

## 2013-09-16 MED ORDER — BIOTENE DRY MOUTH MT LIQD: 15.0000 mL | Freq: Two times a day (BID) | OROMUCOSAL | Status: DC

## 2013-09-16 MED ORDER — PEPCID 20 MG PO TABS: 20.0000 mg | ORAL_TABLET | Freq: Two times a day (BID) | ORAL | Status: DC

## 2013-09-16 MED ORDER — OXYCODONE HCL 5 MG/5ML PO SOLN: 15.0000 mg | ORAL | Status: DC | PRN

## 2013-09-16 MED ORDER — HYDROMORPHONE HCL 2 MG PO TABS
2.0000 mg | ORAL_TABLET | ORAL | Status: DC | PRN
Start: 2013-09-16 — End: 2013-10-10

## 2013-09-16 MED ORDER — MAGIC MOUTHWASH: 15.0000 mL | Freq: Four times a day (QID) | ORAL | Status: DC | PRN

## 2013-09-16 MED ORDER — BISACODYL 10 MG RE SUPP: 10.0000 mg | Freq: Two times a day (BID) | RECTAL | Status: DC | PRN

## 2013-09-16 MED ORDER — MULTIVITAMINS PO CAPS: 1.0000 | ORAL_CAPSULE | Freq: Every day | ORAL | Status: DC

## 2013-09-16 MED ORDER — OXYCODONE HCL 5 MG/5ML PO SOLN
15.0000 mg | ORAL | Status: DC | PRN
  Administered 2013-09-16 (×2): 15 mg
  Filled 2013-09-16 (×2): qty 15

## 2013-09-16 MED ORDER — NICOTINE 14 MG/24HR TD PT24: 14.0000 mg | MEDICATED_PATCH | Freq: Every day | TRANSDERMAL | Status: DC

## 2013-09-16 NOTE — Discharge Summary (Addendum)
Physician Discharge Summary  Randy Le XBJ:[id number] DOB: [birth year] DOA: 08/22/2013  PCP: Kirk Ruths, MD  Admit date: 08/22/2013 Discharge date: 09/16/2013  Recommendations for Outpatient Follow-up:  1. Home health orders in place 2. Continue tube feeds as instructed; goal rate of 75 cc/gr, tolerated well in hospital and will be continued on discharge. Rate 75 cc/hr for 20 hours daily.  3. Pain medications: oxycodone 15 mg every 4 hours as needed for pain and dilaudid for breakthrough pain 4. Please follow up with surgery per scheduled appointment. 5. BLood work was within normal limits prior to discharge   Discharge Diagnoses:  Principal Problem:   Gastric cancer Active Problems:   GERD (gastroesophageal reflux disease)   Weight loss, unintentional   Hyponatremia   Dehydration   Tobacco abuse   COPD (chronic obstructive pulmonary disease)   Pedal edema   Protein-calorie malnutrition, severe   Ascites   Gastric outlet obstruction    Discharge Condition: pt glad to be discharged home today  Diet recommendation: per tube feeds  History of present illness:  57 yo M with past medical history of HTN, COPD, tobacco abuse, presented initially to [facility] on 08/22/2013 with weight loss of 25 lbs over 5 weeks, poor po intake, abdominal pain and nausea. CT showed gastric antral wall thickening. EGD done 08/23/2013 was with findings of gastric outlet obstruction, linitis plastica or gastric lymphoma. Biopsy was non-confirmatory of carcinoma and pt subsequently underwent EUS 08/29/2013 with biopsy showing highly atypical malignant cells c/w diffuse type gastric carcinoma. Surgery and oncology were consulted and patient underwent exploratory surgery 09/09/13 with no possibility of resection or bypass due to significant tumor burden. At this time we are tapering TNA. Tube feeds are at the goal rate of 60 cc/hr.   Assessment/Plan:   Principal Problem:  Abdominal pain secondary to  gastric outlet obstruction due to gastric carcinoma  - EUS biopsy proven gastric carcinoma; status post exploratory laparotomy 09/09/2013, due to significant tumor burden there was no possibility of resection or bypass; now has feeding J tube and G tube for drainage.  - tube feeds at a goal rate of 60  Cc/hr - pain management: fentanyl patch, oxycodone and dilaudid PRN  - appreciate surgery following and their recommendations  Active problems:  Gastric carcinoma  - plan for chemotherapy outpatient  - will need to keep PICC line on discharge  Abnormal liver function tests  - worsening after TPN but now slow downward trend with AST now WNL and ALT 84 and ALP 702  Bilateral Lower Extremity Edema  - Likely related to malnutrition and hypoalbuminemia.  - improved  Hyponatremia  - Related to dehydration.  - Resolved.  Anemia of chronic disease  - related to gastric cancer  - hemoglobin stable at 9.1  Cough  - initially had cough which was likely due to aspiration pneumonia. Pt has received zosyn for 7 days.  - resolved   Code Status: Full  Family Communication: no family at the bedside   Consultants:  Surgery  GI  Oncology Procedures:  EGD 08/23/13  EUS 08/29/13  Exploratory laparotomy 09/09/2013 Antibiotics  Zosyn 08/23/2013 --> 08/30/2013   Signed:  Manson Passey, MD  [facility] 09/16/2013, 10:17 AM  Pager #: [phone]   Discharge Exam: Filed Vitals:   09/16/13 0948  BP: 110/55  Pulse: 81  Temp: 98.1 F (36.7 C)  Resp: 16   Filed Vitals:   09/16/13 0208 09/16/13 0508 09/16/13 0625 09/16/13 0948  BP: 97/66 108/54  110/55  Pulse: 76 80  81  Temp: 98.1 F (36.7 C) 97.5 F (36.4 C)  98.1 F (36.7 C)  TempSrc: Oral Oral  Oral  Resp: 18 16  16   Height:      Weight:   58.6 kg (129 lb 3 oz)   SpO2: 97% 100%  99%    Exam:  General: Pt is sleeping, no acute distress  Cardiovascular: Regular rate and rhythm, S1/S2 appreciated  Respiratory: Clear to  auscultation bilaterally, no wheezing, no crackles, no rhonchi  Abdomen: Soft, some tenderness in mid abdomen; feeding tube and drainage tube  Extremities: No edema, pulses DP and PT palpable bilaterally  Neuro: Grossly nonfocal   Discharge Instructions  Discharge Orders   Future Appointments Provider Department Dept Phone   09/26/2013 10:00 AM [facility] Financial Counselor [facility] [phone]   09/26/2013 10:30 AM Ladene Artist, MD [facility] MEDICAL ONCOLOGY [phone]   09/26/2013 12:30 PM [facility] Chemo [facility] [phone]   10/02/2013 10:30 AM Kandis Cocking, MD [facility], [place] [phone]   Future Orders Complete By Expires   Call MD for:  difficulty breathing, headache or visual disturbances  As directed    Call MD for:  persistant dizziness or light-headedness  As directed    Call MD for:  persistant nausea and vomiting  As directed    Call MD for:  severe uncontrolled pain  As directed    Diet - low sodium heart healthy  As directed    Discharge instructions  As directed    Comments:     1. Home health orders in place 2. Continue tube feeds as instructed 3. Pain medications: oxycodone 15 mg every 4 hours as needed for pain and dilaudid for breakthrough pain 4. Please follow up with surgery per scheduled appointment. 5. BLood work was within normal limits prior to discharge   Increase activity slowly  As directed        Medication List    STOP taking these medications       metoprolol succinate 100 MG 24 hr tablet  Commonly known as:  TOPROL-XL     NEXIUM 40 MG capsule  Generic drug:  esomeprazole      TAKE these medications       antiseptic oral rinse Liqd  15 mLs by Mouth Rinse route 2 times daily at 12 noon and 4 pm.     bisacodyl 10 MG suppository  Commonly known as:  DULCOLAX  Place 1 suppository (10 mg total) rectally every 12 (twelve) hours as  needed for mild constipation or moderate constipation.     chlorhexidine 0.12 % solution  Commonly known as:  PERIDEX  15 mLs by Mouth Rinse route 2 (two) times daily.     feeding supplement (VITAL AF 1.2 CAL) Liqd  Place 1,000 mLs into feeding tube daily.     fentaNYL 50 MCG/HR  Commonly known as:  DURAGESIC - dosed mcg/hr  Place 1 patch (50 mcg total) onto the skin every 3 (three) days.     HYDROmorphone 2 MG tablet  Commonly known as:  DILAUDID  Place 1 tablet (2 mg total) into feeding tube every 4 (four) hours as needed for severe pain.     magic mouthwash Soln  Take 15 mLs by mouth 4 (four) times daily as needed for mouth pain (sore throat).     menthol-cetylpyridinium 3 MG lozenge  Commonly known as:  CEPACOL  Take 1 lozenge (3 mg total) by mouth as needed for sore throat.     multivitamin capsule  Take 1 capsule by mouth daily.     nicotine 14 mg/24hr patch  Commonly known as:  NICODERM CQ - dosed in mg/24 hours  Place 1 patch (14 mg total) onto the skin daily.     ondansetron 4 MG tablet  Commonly known as:  ZOFRAN  Place 1 tablet (4 mg total) into feeding tube every 6 (six) hours as needed for nausea.     oxyCODONE 5 MG/5ML solution  Commonly known as:  ROXICODONE  Place 15 mLs (15 mg total) into feeding tube every 4 (four) hours as needed for moderate pain or severe pain.     PEPCID 20 MG tablet  Generic drug:  famotidine  Place 1 tablet (20 mg total) into feeding tube 2 (two) times daily.     phenol 1.4 % Liqd  Commonly known as:  CHLORASEPTIC  Use as directed 1 spray in the mouth or throat as needed for throat irritation / pain.           Follow-up Information   Follow up with [facility], MD On 10/02/2013. (arrive by 10:15am for a 10:30am appt.)    Specialty:  General Surgery   Contact information:   [address] [place] [address] [phone]       Follow up with Kirk Ruths, MD. Schedule an appointment as soon as  possible for a visit in 2 weeks.   Specialty:  Family Medicine   Contact information:   [address] [place] [address] [phone]        The results of significant diagnostics from this hospitalization (including imaging, microbiology, ancillary and laboratory) are listed below for reference.    Significant Diagnostic Studies: Dg Chest Port 1 View  08/23/2013   CLINICAL DATA:  PICC placement.  EXAM: PORTABLE CHEST - 1 VIEW  COMPARISON:  Yesterday.  FINDINGS: Interval right PICC with its tip at the cavoatrial junction. Normal sized heart. Clear lungs. Bilateral skin folds. Minimal scoliosis.  IMPRESSION: 1. Right PICC tip at the cavoatrial junction. 2. No acute abnormality.   Electronically Signed   By: Gordan Payment M.D.   On: 08/23/2013 16:06   Dg Abd Acute W/chest  08/22/2013   CLINICAL DATA:  Abdominal pain and lower extremity swelling. History of small bowel resection. Smoker and hypertension.  EXAM: ACUTE ABDOMEN SERIES (ABDOMEN 2 VIEW & CHEST 1 VIEW)  COMPARISON:  CT 08/13/2013.  Plain film chest 08/02/2013.  FINDINGS: Frontal view of the chest demonstrates midline trachea. Normal heart size. Aortic atherosclerosis. No pleural effusion or pneumothorax. Hyperinflation/COPD. Clear lungs.  Abdominal films demonstrate no free intraperitoneal air or significant air-fluid levels on upright positioning. Paucity of small bowel gas on supine imaging. No significant distal gas identified. No abnormal abdominal calcifications. The right-sided abdominal calcification is likely within the enteric stream, when correlated with recent CT.  IMPRESSION: Nonspecific paucity of abdominal pelvic bowel gas. This could relate to ascites, given the appearance of the abdomen on prior CT. Fluid-filled bowel loops cannot be excluded.  No free intraperitoneal air.  Hyperinflation/COPD, without acute cardiopulmonary disease.   Electronically Signed   By: Jeronimo Greaves M.D.   On: 08/22/2013  17:21    Microbiology: No results found for this or any previous visit (from the past 240 hour(s)).   Labs: Basic Metabolic Panel:  Recent Labs  Lab 09/10/13 0500 09/11/13 0505 09/12/13 0530 09/14/13 0540  NA 136 134* 135 137  K 3.9 4.0 3.8 3.9  CL 104 102 102 103  CO2 27 28 27 28   GLUCOSE 126* 122* 118* 97  BUN 17 16 15 16   CREATININE 0.63 0.59 0.56 0.55  CALCIUM 8.3* 8.6 8.6 8.4  MG  --  1.9 1.9  --   PHOS  --  3.9 4.1  --    Liver Function Tests:  Recent Labs Lab 09/10/13 0500 09/11/13 0505 09/12/13 0530  AST 87* 41* 25  ALT 183* 125* 84*  ALKPHOS 902* 799* 702*  BILITOT 4.5* 3.5* 2.8*  PROT 4.4* 4.6* 4.6*  ALBUMIN 1.5* 1.5* 1.4*   No results found for this basename: LIPASE, AMYLASE,  in the last 168 hours No results found for this basename: AMMONIA,  in the last 168 hours CBC:  Recent Labs Lab 09/10/13 0500 09/11/13 0505  WBC 8.5 8.9  HGB 9.7* 9.1*  HCT 29.3* 27.3*  MCV 95.4 95.8  PLT 316 327   Cardiac Enzymes: No results found for this basename: CKTOTAL, CKMB, CKMBINDEX, TROPONINI,  in the last 168 hours BNP: BNP (last 3 results)  Recent Labs  08/22/13 1727  PROBNP 113.8   CBG:  Recent Labs Lab 09/15/13 0636 09/15/13 1202 09/15/13 1738 09/15/13 2321 09/16/13 0622  GLUCAP 81 105* 113* 84 97    Time coordinating discharge: Over 30 minutes

## 2013-09-16 NOTE — Progress Notes (Signed)
Note from Dr. Truett Perna : Needs hospital F/U visit with him on 09/26/13 at 10:30 (30 minutes)-DX: Gastric Cancer Also needs chemo class scheduled please for 09/26/13 at 12:30 for FOLFOX Currently in #1301 and going home today-asking for appointment to be scheduled asap so it will be on his discharge papers-[facility] dept.  Notified.

## 2013-09-16 NOTE — Progress Notes (Signed)
Patient reported that the use of 10 ml/ 10mg  of roxicodone was ineffective at controlling his pain overall, but did show some minimal improvement.  Patient states that further increase to 32ml/ 15mg  via tube would likely control his pain after discharge home.

## 2013-09-16 NOTE — Progress Notes (Signed)
NUTRITION FOLLOW UP  Intervention:   - Increase Vital AF 1.2 to 75 ml/hr via J tube and run for 20 hours daily. At goal rate, tube feeding regimen will provide 1800 kcal, 113 grams of protein, and 1216 ml of free H2O.  - Once continuous infusions discontinue; recommend additional 100 ml free water flushes 6 times daily to provide a total of 1816 ml of free water daily.   -Eventually pt may be able to transition TF regimen to 16 hours daily by increasing TF rate to 90 ml/hr. This will provide 1728 kcal and 108 grams protein.   Nutrition Dx:   Inadequate oral intake related to inability to eat, altered GI funtion as evidenced by NPO status, NG tube placed for gastric decompression; ongoing  Goal:   Pt to meet >/= 90% of their estimated nutrition needs; being met  Monitor:   Nutrition Support, Labs, Weights   Assessment:   57 yo M with gastric outlet obstruction due to gastric mass. Biopsy showed gastric carcinoma, diffuse type. Unable to place post-pyloric tube for enteral feedings; parenteral nutrition started 12/5.  12/23: Pt now has G and J tube in place. RD consulted for TF management. Pt states he has not had anything to eat for 6 weeks now. He reports a 2 year history of diarrhea after having 2-3 feet of his small bowel removed 2 years ago. Recommend starting TF's using Vital AF 1.2 due to history of bowel surgery and diarrhea, and due to no PO intake for 6 weeks.   12/24: RD consulted for enteral/TF management: Start jejunostomy feeds today, 10-15 ml to start, we will advance slowly over the next few days and wean TNA. No new weights. Discussed with pt that TF will be advanced slowly and to let RN know if he feels nausea, pain, or distension.   12/26: Clinimix 5/15 @ Clinimix E 5/15 at 75 mL/hr + 20% fat emulsion at 10 mL/hr. Regimen delivers 90 grams protein and 1758 kcal/day. Pt is also receiving Vital AF 1.2 via J tube @ 50 ml per hour which provides 1440 kcal, 90 grams protein, and  973 ml of free water. TPN plus TF combined are currently providing 3198 kcal and 180 grams protein meeting 176% of estimated energy needs and 200% of estimated protein needs. Per Pharmacy note, plan to wean TPN to 40 ml/hr and discontinuing to off in the AM if pt is able to tolerate Vital AF 1.2 at goal rate of 60 ml/hr.  Pt states his pain is mild as it is normally. He states he hasn't had a bowel movement yet. Pt's weight is trending up.   12/29 Pt is tolerating Vital AF 1.2 at goal rate of 60 ml/hr continuous. BM on 12/28. Pt is ready for discharge and can be transitioned to cyclic feeds. Increase TF rate to 75 ml/hr and run for 20 hours daily. Eventually pt may be able to transition TF regimen to 16 hours daily at rate of 90 ml/hr. Weight down one pound.   Blood glucose ranging 81 to 113 mg/dL Low hemoglobin Potassium, magnesium, and phosphorus are WNL Elevated  ALT, and Alk Phos; AST now WNL Elevated bilirubin  Height: Ht Readings from Last 1 Encounters:  09/11/13 5\' 8"  (1.727 m)    Weight Status:   Wt Readings from Last 1 Encounters:  09/16/13 129 lb 3 oz (58.6 kg)    Re-estimated needs:  Kcal: 1820-2080  Protein: 90-105 grams Fluid: 1.8 L daily   Skin: abdominal  incision  Diet Order: NPO   Intake/Output Summary (Last 24 hours) at 09/16/13 1039 Last data filed at 09/16/13 0948  Gross per 24 hour  Intake    240 ml  Output   2050 ml  Net  -1810 ml    Last BM: 12/28   Labs:   Recent Labs Lab 09/11/13 0505 09/12/13 0530 09/14/13 0540  NA 134* 135 137  K 4.0 3.8 3.9  CL 102 102 103  CO2 28 27 28   BUN 16 15 16   CREATININE 0.59 0.56 0.55  CALCIUM 8.6 8.6 8.4  MG 1.9 1.9  --   PHOS 3.9 4.1  --   GLUCOSE 122* 118* 97    CBG (last 3)   Recent Labs  09/15/13 1738 09/15/13 2321 09/16/13 0622  GLUCAP 113* 84 97    Scheduled Meds: . antiseptic oral rinse  15 mL Mouth Rinse q12n4p  . chlorhexidine  15 mL Mouth Rinse BID  . enoxaparin (LOVENOX)  injection  40 mg Subcutaneous Q24H  . feeding supplement (VITAL AF 1.2 CAL)  1,000 mL Per Tube Q24H  . fentaNYL  50 mcg Transdermal Q72H  . insulin aspart  0-9 Units Subcutaneous Q6H  . lip balm  1 application Topical BID  . nicotine  14 mg Transdermal Daily  . pantoprazole (PROTONIX) IV  40 mg Intravenous Q12H  . sodium chloride  10-40 mL Intracatheter Q12H    Continuous Infusions: . sodium chloride 0.9 % 1,000 mL with potassium chloride 40 mEq infusion 20 mL/hr at 09/14/13 0420    Ian Malkin RD, LDN Inpatient Clinical Dietitian Pager: [phone] After Hours Pager: [phone]

## 2013-09-16 NOTE — Progress Notes (Signed)
Patient discharged home, all discharge medications and instructions reviewed. Educated on peg/pej maintenance and dressing changes. Patient and wife state they understand the instructions and have no questions at this time.  Patient to be assisted to vehicle by wheelchair.

## 2013-09-16 NOTE — Progress Notes (Signed)
Pt was discharged before I saw him.  Agree with above note

## 2013-09-16 NOTE — Progress Notes (Signed)
Patient ID: Randy Le, male   DOB: Sep 02, 1956, 57 y.o.   MRN: [id number]  Subjective: Pt tolerating tube feeds, still having pain, somewhat improved with oxycodone.  Denies n/v.   Objective:  Vital signs:  Filed Vitals:   09/15/13 2057 09/16/13 0208 09/16/13 0508 09/16/13 0625  BP: 110/58 97/66 108/54   Pulse: 85 76 80   Temp: 97.9 F (36.6 C) 98.1 F (36.7 C) 97.5 F (36.4 C)   TempSrc: Oral Oral Oral   Resp: 14 18 16    Height:      Weight:    129 lb 3 oz (58.6 kg)  SpO2: 99% 97% 100%     Last BM Date: 09/15/13  Intake/Output   Yesterday:  12/28 0701 - 12/29 0700 In: 0  Out: 1950 [Urine:800; Drains:1150] This shift:    I/O last 3 completed shifts: In: 0  Out: 3050 [Urine:1150; Drains:1900]       Physical Exam:  General: Pt awake/alert/oriented x3 in no acute distress Abdomen: Soft.  Nondistended. Appropriately tender.  Midline incision intact.  No evidence of peritonitis.  No incarcerated hernias.  Problem List:   Principal Problem:   Gastric cancer Active Problems:   GERD (gastroesophageal reflux disease)   Weight loss, unintentional   Hyponatremia   Dehydration   Tobacco abuse   COPD (chronic obstructive pulmonary disease)   Pedal edema   Protein-calorie malnutrition, severe   Gastric outlet obstruction   Ascites    Results:   Labs: Results for orders placed during the hospital encounter of 08/22/13 (from the past 48 hour(s))  GLUCOSE, CAPILLARY     Status: Abnormal   Collection Time    09/14/13  1:07 PM      Result Value Range   Glucose-Capillary 105 (*) 70 - 99 mg/dL   Comment 1 Notify RN    GLUCOSE, CAPILLARY     Status: Abnormal   Collection Time    09/14/13  6:03 PM      Result Value Range   Glucose-Capillary 113 (*) 70 - 99 mg/dL   Comment 1 Notify RN     Comment 2 Documented in Chart    GLUCOSE, CAPILLARY     Status: None   Collection Time    09/14/13 11:41 PM      Result Value Range   Glucose-Capillary 90  70 - 99  mg/dL   Comment 1 Notify RN    GLUCOSE, CAPILLARY     Status: None   Collection Time    09/15/13  6:36 AM      Result Value Range   Glucose-Capillary 81  70 - 99 mg/dL  GLUCOSE, CAPILLARY     Status: Abnormal   Collection Time    09/15/13 12:02 PM      Result Value Range   Glucose-Capillary 105 (*) 70 - 99 mg/dL   Comment 1 Documented in Chart     Comment 2 Notify RN    GLUCOSE, CAPILLARY     Status: Abnormal   Collection Time    09/15/13  5:38 PM      Result Value Range   Glucose-Capillary 113 (*) 70 - 99 mg/dL  GLUCOSE, CAPILLARY     Status: None   Collection Time    09/15/13 11:21 PM      Result Value Range   Glucose-Capillary 84  70 - 99 mg/dL   Comment 1 Notify RN    GLUCOSE, CAPILLARY     Status: None   Collection Time  09/16/13  6:22 AM      Result Value Range   Glucose-Capillary 97  70 - 99 mg/dL   Comment 1 Notify RN      Imaging / Studies: No results found.  Medications / Allergies: per chart  Antibiotics: Anti-infectives   Start     Dose/Rate Route Frequency Ordered Stop   09/09/13 0600  [MAR Hold]  metroNIDAZOLE (FLAGYL) IVPB 500 mg     (On MAR Hold since 09/09/13 1255)  Comments:  Pharmacy may adjust dosing strength, interval, or rate of medication as needed for optimal therapy for the patient Send with patient on call to the OR.  Anesthesia to complete antibiotic administration <64min prior to incision per [facility].   500 mg 100 mL/hr over 60 Minutes Intravenous On call to O.R. 09/07/13 0756 09/09/13 1430   09/09/13 0000  [MAR Hold]  ceFAZolin (ANCEF) IVPB 2 g/50 mL premix     (On MAR Hold since 09/09/13 1255)   2 g 100 mL/hr over 30 Minutes Intravenous 60 min pre-op 09/07/13 0756 09/09/13 1415   08/23/13 1400  piperacillin-tazobactam (ZOSYN) IVPB 3.375 g  Status:  Discontinued     3.375 g 12.5 mL/hr over 240 Minutes Intravenous 3 times per day 08/23/13 1247 08/30/13 1038      Assessment/Plan PCM Open JEJUNOSTOMY/GASTROSTOMY TUBE for  advanced unresectable diffuse type gastric cancer - D. Newman - 09/09/2013  Tube feedings at 60 cc/hr (target).  Stable for discharge from surgical standpoint with [facility] for TF Will have him follow up with Dr. Ezzard Standing in 7-10 days, I will schedule the appointment this AM and place in discharge instructions Will increase liquid oxycodone through J tube for pain control.    Ashok Norris, [facility] Surgery Pager [phone] Office [phone]  09/16/2013 7:30 AM

## 2013-09-23 ENCOUNTER — Other Ambulatory Visit: Payer: Self-pay | Admitting: *Deleted

## 2013-09-24 ENCOUNTER — Telehealth: Payer: Self-pay | Admitting: *Deleted

## 2013-09-24 NOTE — Telephone Encounter (Signed)
Protocol is flush each port (triple lumen PICC) daily with NS and heparin 250 units. Very expensive for patient to do this and will be out of flushes this week. Asking what is Cancer Protocol for flushing PICC line? Nurse reports that despite what is documented in EPIC it is a Power PICC. Suggested he only flush it on MWF with NS 10 cc followed by 250 units (2.33ml) of heparin each port. Weekly dressing and cap change and prn. When he is in office Thursday, will need MD to send script for these supplies to [facility]-too expensive for him to get these through [facility]-runs $90/week.

## 2013-09-26 ENCOUNTER — Encounter: Payer: Self-pay | Admitting: Oncology

## 2013-09-26 ENCOUNTER — Telehealth: Payer: Self-pay | Admitting: Oncology

## 2013-09-26 ENCOUNTER — Ambulatory Visit: Payer: Federal, State, Local not specified - PPO

## 2013-09-26 ENCOUNTER — Other Ambulatory Visit: Payer: Self-pay | Admitting: *Deleted

## 2013-09-26 ENCOUNTER — Other Ambulatory Visit: Payer: Federal, State, Local not specified - PPO

## 2013-09-26 ENCOUNTER — Ambulatory Visit (HOSPITAL_BASED_OUTPATIENT_CLINIC_OR_DEPARTMENT_OTHER): Payer: Federal, State, Local not specified - PPO | Admitting: Oncology

## 2013-09-26 ENCOUNTER — Encounter: Payer: Self-pay | Admitting: *Deleted

## 2013-09-26 ENCOUNTER — Telehealth: Payer: Self-pay | Admitting: *Deleted

## 2013-09-26 ENCOUNTER — Ambulatory Visit: Payer: Federal, State, Local not specified - PPO | Admitting: Nutrition

## 2013-09-26 ENCOUNTER — Ambulatory Visit (HOSPITAL_BASED_OUTPATIENT_CLINIC_OR_DEPARTMENT_OTHER): Payer: Federal, State, Local not specified - PPO

## 2013-09-26 VITALS — BP 112/60 | HR 77 | Resp 18 | Ht 68.0 in | Wt 118.9 lb

## 2013-09-26 DIAGNOSIS — J4489 Other specified chronic obstructive pulmonary disease: Secondary | ICD-10-CM

## 2013-09-26 DIAGNOSIS — Z452 Encounter for adjustment and management of vascular access device: Secondary | ICD-10-CM

## 2013-09-26 DIAGNOSIS — J449 Chronic obstructive pulmonary disease, unspecified: Secondary | ICD-10-CM

## 2013-09-26 DIAGNOSIS — C169 Malignant neoplasm of stomach, unspecified: Secondary | ICD-10-CM

## 2013-09-26 DIAGNOSIS — K311 Adult hypertrophic pyloric stenosis: Secondary | ICD-10-CM

## 2013-09-26 DIAGNOSIS — R188 Other ascites: Secondary | ICD-10-CM

## 2013-09-26 MED ORDER — HEPARIN SOD (PORK) LOCK FLUSH 100 UNIT/ML IV SOLN
250.0000 [IU] | INTRAVENOUS | Status: DC | PRN
  Filled 2013-09-26: qty 5

## 2013-09-26 MED ORDER — HEPARIN SOD (PORK) LOCK FLUSH 100 UNIT/ML IV SOLN
500.0000 [IU] | Freq: Once | INTRAVENOUS | Status: AC
  Administered 2013-09-26: 250 [IU] via INTRAVENOUS
  Filled 2013-09-26: qty 5

## 2013-09-26 MED ORDER — NORMAL SALINE FLUSH 0.9 % IV SOLN: INTRAVENOUS | Status: AC

## 2013-09-26 MED ORDER — SODIUM CHLORIDE 0.9 % IJ SOLN
10.0000 mL | INTRAMUSCULAR | Status: DC | PRN
  Filled 2013-09-26: qty 10

## 2013-09-26 MED ORDER — ONDANSETRON 8 MG PO TBDP: 8.0000 mg | ORAL_TABLET | Freq: Three times a day (TID) | ORAL | Status: DC | PRN

## 2013-09-26 MED ORDER — SODIUM CHLORIDE 0.9 % IJ SOLN
10.0000 mL | INTRAMUSCULAR | Status: DC | PRN
  Administered 2013-09-26: 10 mL via INTRAVENOUS
  Filled 2013-09-26: qty 10

## 2013-09-26 MED ORDER — HEPARIN SOD (PORK) LOCK FLUSH 100 UNIT/ML IV SOLN: 250.0000 [IU] | INTRAVENOUS | Status: AC

## 2013-09-26 NOTE — Progress Notes (Signed)
Checked in patient for follow up. No financial issues.  °

## 2013-09-26 NOTE — Patient Instructions (Signed)
Peripherally Inserted Central Catheter (PICC) Home Guide A peripherally inserted central catheter (PICC) is a long, thin, flexible tube that is inserted into a vein in the upper arm. It is a form of intravenous (IV) access. It is considered to be a "central" line because the tip of the PICC ends in a large vein in your chest. This large vein is called the superior vena cava (SVC). The PICC tip ends in the SVC because there is a lot of blood flow in the SVC. This allows medicines and IV fluids to be quickly distributed throughout the body. The PICC is inserted using a sterile technique by a specially trained nurse or physician. After the PICC is inserted, a chest X-ray is done to be sure it is in the correct place.  A PICC may be placed for different reasons, such as:  To give medicines and liquid nutrition that can only be given through a central line. Examples are:  Certain antibiotic treatments.  Chemotherapy.  Total parenteral nutrition (TPN).  To take frequent blood samples.  To give IV fluids and blood products.  If there is difficulty placing a peripheral intravenous (PIV) catheter. If taken care of properly, a PICC can remain in place for several months. A PICC can also allow patients to go home early. Medicine and PICC care can be managed at home by a family member or home healthcare team. RISKS AND COMPLICATIONS Possible problems with a PICC can occasionally occur. This may include:  A clot (thrombus) forming in or at the tip of the PICC. This can cause the PICC to become clogged. A "clot-busting" medicine called tissue plasminogen activator (tPA) can be inserted into the PICC to help break up the clot.  Inflammation of the vein (phlebitis) in which the PICC is placed. Signs of inflammation may include redness, pain at the insertion site, red streaks, or being able to feel a "cord" in the vein where the PICC is located.  Infection in the PICC or at the insertion site. Signs of  infection may include fever, chills, redness, swelling, or pus drainage from the PICC insertion site.  PICC movement (malposition). The PICC tip may migrate from its original position due to excessive physical activity, forceful coughing, sneezing, or vomiting.  A break or cut in the PICC. It is important to not use scissors near the PICC.  Nerve or tendon irritation or injury during PICC insertion. HOME CARE INSTRUCTIONS Activity  You may bend your arm and move it freely. If your PICC is near or at the bend of your elbow, avoid activity with repeated motion at the elbow.  Avoid lifting heavy objects as instructed by your caregiver.  Avoid using a crutch with the arm on the same side as your PICC. You may need to use a walker. PICC Dressing  Keep your PICC bandage (dressing) clean and dry to prevent infection.  Ask your caregiver when you may shower. Ask your caregiver to teach you how to wrap the PICC when you do take a shower.  Do not bathe, swim, or use hot tubs when you have a PICC.  Change the PICC dressing as instructed by your caregiver.  Change your PICC dressing if it becomes loose or wet. General PICC Care  Check the PICC insertion site daily for leakage, redness, swelling, or pain.  Flush the PICC as directed by your caregiver. Let your caregiver know right away if the PICC is difficult to flush or does not flush. Do not use force   to flush the PICC.  Do not use a syringe that is less than 10 mLs to flush the PICC.  Never pull or tug on the PICC.  Avoid blood pressure checks on the arm with the PICC.  Keep your PICC identification card with you at all times.  Do not take the PICC out yourself. Only a trained clinical professional should remove the PICC. SEEK IMMEDIATE MEDICAL CARE IF:  Your PICC is accidently pulled all the way out. If this happens, cover the insertion site with a bandage or gauze dressing. Do not throw the PICC away. Your caregiver will need to  inspect it.  Your PICC was tugged or pulled and has partially come out. Do not  push the PICC back in.  There is any type of drainage, redness, or swelling where the PICC enters the skin.  You cannot flush the PICC, it is difficult to flush, or the PICC leaks around the insertion site when it is flushed.  You hear a "flushing" sound when the PICC is flushed.  You have pain, discomfort, or numbness in your arm, shoulder, or jaw on the same side as the PICC .  You feel your heart "racing" or skipping beats.  You notice a hole or tear in the PICC.  You develop chills or a fever. MAKE SURE YOU:   Understand these instructions.  Will watch your condition.  Will get help right away if you are not doing well or get worse. Document Released: 03/12/2003 Document Revised: 11/28/2011 Document Reviewed: 01/10/2011 ExitCare Patient Information 2014 [employer].  

## 2013-09-26 NOTE — Progress Notes (Signed)
Patient is a 58 year old male diagnosed with gastric cancer.  He is a patient of Dr. Benay Spice.  Patient has a feeding jejunostomy tube for nutrition support along with G-tube to drainage.  Past medical history includes GERD, unintentional weight loss, tobacco, COPD, and hypertension.  Medications include multivitamin and Pepcid.  Labs include sodium 137, potassium 3.9, glucose 97 on December 27.  Height: 68 inches. Weight: 118.9 pounds January 8. Usual body weight: 160 pounds in July 2012. BMI: 18.06.  Estimated nutrition needs: 2000-2200 calories, 105-120 g protein, 2.2 L fluid.  Vital AF: 1728 calories, 108 g protein, 1648 mL free water.  Patient is status post recent diagnosis of gastric cancer with placement of a jejunostomy feeding tube.  Patient is only drinking some sips of water or ice chips at this time.  Tube feedings need to provide 100% of patient's estimated nutrition needs.  Patient is currently using Vital AF at 90 mL an hour for 16 hours daily.  Patient uses 120 mL of free water 4 times a day.  He has had no issues with tolerance.  Patient is enjoying time off of feeding, and does not want to increase hours on tube feedings.  Patient told by oncologist he can have liquids of choice.  At present time anything by mouth will drain through G-Tube.  Nutrition diagnosis: Unintended weight loss related to diagnosis of gastric cancer as evidenced by 26% weight loss from usual body weight in July of 2012.  Intervention: Patient educated to continue present tube feeding of vital AF at 90 mL an hour for 16 hours daily.  Patient to increase free water flushes to 120 mL 5 times a day to provide a total of 1768 mL free water.  Diet advancement per M.D.  Patient will work to increase liquids/Calories and protein by mouth. I will evaluate weight changes and adjust tube feeding as needed.  Monitoring, evaluation, goals: Patient will tolerate tube feedings with free water flushes to minimize  weight loss.  Next visit: Wednesday, January 14, during chemotherapy.

## 2013-09-26 NOTE — Telephone Encounter (Signed)
Gave pt appt for lab,md and chemo for January 2015 °

## 2013-09-26 NOTE — Telephone Encounter (Signed)
Per staff message and POF I have scheduled appts.  JMW  

## 2013-09-26 NOTE — Progress Notes (Signed)
   [place]    OFFICE PROGRESS NOTE   INTERVAL HISTORY:   I saw him while hospitalized last month when he was diagnosed with metastatic gastric cancer. He was discharged on 09/16/2013. He reports tolerating the jejunostomy tube feedings well. He is only taking water and ice by mouth. The gastrostomy tube is open to a drainage bag.  The pain is under control with a Duragesic patch. He is flushing the right PICC.  Objective:  Vital signs in last 24 hours:  Blood pressure 112/60, pulse 77, resp. rate 18, height $RemoveBe'5\' 8"'mAMbSDlwb$  (1.727 m), weight 118 lb 14.4 oz (53.933 kg).    HEENT: Scleral icterus Resp: Scattered bilateral inspiratory/expiratory wheeze, no respiratory distress Cardio: Regular rate and rhythm GI: Mildly distended, no apparent ascites. Midline incision with staples in place. Left upper quadrant tube site without evidence of infection Vascular: Trace pitting edema at the left greater than right lower leg and foot  Portacath/PICC-with mild erythema at the skin exit  Lab Results:  Lab Results  Component Value Date   WBC 8.9 09/11/2013   HGB 9.1* 09/11/2013   HCT 27.3* 09/11/2013   MCV 95.8 09/11/2013   PLT 327 09/11/2013   NEUTROABS 5.2 09/09/2013   09/12/2013-alkaline phosphatase 702, AST 25, ALT 84, bilirubin 2.8   Medications: I have reviewed the patient's current medications.  Assessment/Plan: 1. Gastric cancer -stage IV-tumor involving the distal one third of the stomach, transverse colon, mesentery of the transverse colon/small bowel, retroperitoneum, and pelvic "drop metastases "identified at the time of surgery on 09/09/2013.  2. Gastric outlet obstruction secondary to #1 , status post a palliative gastrostomy tube and jejunostomy feeding tube placement 09/09/2013  3. Malnutrition -maintained on jejunostomy tube feedings 4. Ascites-likely malignant, cytology not submitted on 09/09/2013  5. COPD 6. Elevated liver enzymes/bilirubin-likely  related to TNA administered while hospitalized in December 2014 7. Right PICC   Disposition:  His performance status has improved since discharge from the hospital. He appears to be tolerating the tube feedings. I discussed the diagnosis of metastatic gastric cancer and treatment options with Mr. Nehme and his wife. He understands no therapy will be curative. He would like to proceed with a trial of palliative chemotherapy with the hope of improving the gastric outlet obstruction and obtaining a partial remission. He will begin clear liquids by mouth as tolerated.  I recommend FOLFOX chemotherapy. We reviewed the specific toxicities associated with this regimen including the chance for nausea/vomiting, mucositis, alopecia, an allergic reaction, and hematologic toxicity. We discussed the rash, hyperpigmentation, and hand/foot syndrome associated with 5-fluorouracil. We discussed the various types of neuropathy seen with oxaliplatin. He agrees to proceed. Mr. Gopaul will attend a chemotherapy teaching class.  We will check a CEA and liver panel when he is here for chemotherapy teaching class. We will dose adjust the 5-fluorouracil as indicated based on the liver panel.  He received instruction in PICC care today. He also met with the [place] nutritionist.  Mr. Cullinane is scheduled for a first cycle of FOLFOX 10/02/2013. He will return for an office visit and chemotherapy on 10/16/2013.  The gastric tumor biopsy will be submitted for HER-2/neu testing. We will add Herceptin to the systemic therapy regimen if the tumor is HER-2 amplified.  Approximately 40 minutes were spent with patient today. The majority of the time was used for counseling and coordination of care.  Betsy Coder, MD  09/26/2013  5:27 PM

## 2013-09-26 NOTE — Progress Notes (Signed)
Met with Randy Le and family. Explained role of nurse navigator. Educational information provided on stomach cancer  [place] resources provided to patient, including SW service information.  He is going to be seen by the SW today before he leaves to address his disability and other questions.  Referral made to dietician for diet education and he was seen by dietician during office visit.   Contact names and phone numbers were provided for entire [facility] team.  Teach back method was used.   Will continue to follow as needed.

## 2013-09-27 ENCOUNTER — Encounter: Payer: Self-pay | Admitting: *Deleted

## 2013-09-27 NOTE — Progress Notes (Signed)
Cut Off Work  Clinical Social Work was referred by patient navigator, Marcellus Scott for assessment of psychosocial needs due to new cancer diagnosis and disability questions.  Clinical Social Worker met with patient and his wife briefly in lobby at [facility] to offer support and assess for needs.  Pt eager to discuss disability and other resources. CSW brought packet of info for Pt and his wife to look over. CSW attempted to explain process, but they both kept asking the same questions repeatedly and appeared overwhelmed with info currently. CSW plans to meet with them at first chemo appointment to further assess needs and discuss resources in more detail. Pt and wife stated understanding.    Clinical Social Work interventions: Supportive Psychiatric nurse and referral  Loren Racer, [place] Social Worker [facility] for [place] Wednesday, Thursday and Friday Phone: [phone] Fax: [phone]

## 2013-09-29 ENCOUNTER — Other Ambulatory Visit: Payer: Self-pay | Admitting: Oncology

## 2013-09-30 ENCOUNTER — Telehealth: Payer: Self-pay | Admitting: *Deleted

## 2013-09-30 NOTE — Telephone Encounter (Signed)
Made Miranda, with [facility] aware that the only change made was giving him the netting sleeve to put over his PICC line. The flush was the same. Made her aware of treatment schedule. She will change his dressing/caps today and then get his schedule moved to every Wednesday dressing change on the week he is not getting chemo (to save # nursing visits for him). Informed her our office will change the dressing on Friday pump D/C day so he will not need to wait over 7 days for the Wednesday dressing change. This should then get him on schedule.

## 2013-09-30 NOTE — Telephone Encounter (Signed)
Patient reports to home health nurse that he was given something different for his PICC when he was in the office on 09/26/13. She is asking for specifics, since she follows him at home.

## 2013-10-02 ENCOUNTER — Ambulatory Visit (INDEPENDENT_AMBULATORY_CARE_PROVIDER_SITE_OTHER): Payer: Federal, State, Local not specified - PPO | Admitting: Surgery

## 2013-10-02 ENCOUNTER — Ambulatory Visit (HOSPITAL_BASED_OUTPATIENT_CLINIC_OR_DEPARTMENT_OTHER): Payer: Federal, State, Local not specified - PPO

## 2013-10-02 ENCOUNTER — Other Ambulatory Visit: Payer: Self-pay | Admitting: *Deleted

## 2013-10-02 ENCOUNTER — Ambulatory Visit: Payer: Federal, State, Local not specified - PPO | Admitting: Nutrition

## 2013-10-02 ENCOUNTER — Other Ambulatory Visit (HOSPITAL_BASED_OUTPATIENT_CLINIC_OR_DEPARTMENT_OTHER): Payer: Federal, State, Local not specified - PPO

## 2013-10-02 ENCOUNTER — Encounter: Payer: Self-pay | Admitting: *Deleted

## 2013-10-02 ENCOUNTER — Encounter (INDEPENDENT_AMBULATORY_CARE_PROVIDER_SITE_OTHER): Payer: Self-pay | Admitting: Surgery

## 2013-10-02 ENCOUNTER — Other Ambulatory Visit: Payer: Self-pay | Admitting: Nurse Practitioner

## 2013-10-02 ENCOUNTER — Telehealth: Payer: Self-pay | Admitting: *Deleted

## 2013-10-02 ENCOUNTER — Ambulatory Visit: Payer: Federal, State, Local not specified - PPO

## 2013-10-02 VITALS — BP 115/64 | HR 92 | Temp 97.8°F

## 2013-10-02 VITALS — BP 126/70 | HR 62 | Temp 98.0°F | Resp 18 | Ht 69.0 in | Wt 110.0 lb

## 2013-10-02 DIAGNOSIS — C169 Malignant neoplasm of stomach, unspecified: Secondary | ICD-10-CM

## 2013-10-02 DIAGNOSIS — E43 Unspecified severe protein-calorie malnutrition: Secondary | ICD-10-CM

## 2013-10-02 DIAGNOSIS — E46 Unspecified protein-calorie malnutrition: Secondary | ICD-10-CM

## 2013-10-02 DIAGNOSIS — Z452 Encounter for adjustment and management of vascular access device: Secondary | ICD-10-CM

## 2013-10-02 DIAGNOSIS — R197 Diarrhea, unspecified: Secondary | ICD-10-CM

## 2013-10-02 LAB — CBC WITH DIFFERENTIAL/PLATELET
BASO%: 0.7 % (ref 0.0–2.0)
BASOS ABS: 0.1 10*3/uL (ref 0.0–0.1)
EOS%: 0.2 % (ref 0.0–7.0)
Eosinophils Absolute: 0 10*3/uL (ref 0.0–0.5)
HEMATOCRIT: 31.1 % — AB (ref 38.4–49.9)
HEMOGLOBIN: 10.5 g/dL — AB (ref 13.0–17.1)
LYMPH#: 1 10*3/uL (ref 0.9–3.3)
LYMPH%: 11.8 % — ABNORMAL LOW (ref 14.0–49.0)
MCH: 32.7 pg (ref 27.2–33.4)
MCHC: 33.7 g/dL (ref 32.0–36.0)
MCV: 97 fL (ref 79.3–98.0)
MONO#: 0.8 10*3/uL (ref 0.1–0.9)
MONO%: 8.7 % (ref 0.0–14.0)
NEUT#: 6.9 10*3/uL — ABNORMAL HIGH (ref 1.5–6.5)
NEUT%: 78.6 % — ABNORMAL HIGH (ref 39.0–75.0)
PLATELETS: 217 10*3/uL (ref 140–400)
RBC: 3.21 10*6/uL — ABNORMAL LOW (ref 4.20–5.82)
RDW: 16.2 % — ABNORMAL HIGH (ref 11.0–14.6)
WBC: 8.7 10*3/uL (ref 4.0–10.3)

## 2013-10-02 LAB — COMPREHENSIVE METABOLIC PANEL (CC13)
ALBUMIN: 2.6 g/dL — AB (ref 3.5–5.0)
ALK PHOS: 452 U/L — AB (ref 40–150)
ALT: 98 U/L — AB (ref 0–55)
ALT: 104 U/L — ABNORMAL HIGH (ref 0–55)
ANION GAP: 18 meq/L — AB (ref 3–11)
AST: 40 U/L — AB (ref 5–34)
AST: 40 U/L — ABNORMAL HIGH (ref 5–34)
Albumin: 2.4 g/dL — ABNORMAL LOW (ref 3.5–5.0)
Alkaline Phosphatase: 421 U/L — ABNORMAL HIGH (ref 40–150)
Anion Gap: 20 mEq/L — ABNORMAL HIGH (ref 3–11)
BILIRUBIN TOTAL: 2.19 mg/dL — AB (ref 0.20–1.20)
BUN: 34.8 mg/dL — ABNORMAL HIGH (ref 7.0–26.0)
BUN: 34.2 mg/dL — AB (ref 7.0–26.0)
CALCIUM: 9.4 mg/dL (ref 8.4–10.4)
CO2: 43 mEq/L — ABNORMAL HIGH (ref 22–29)
CO2: 43 mEq/L — ABNORMAL HIGH (ref 22–29)
Calcium: 9.3 mg/dL (ref 8.4–10.4)
Chloride: 90 mEq/L — ABNORMAL LOW (ref 98–109)
Chloride: 88 mEq/L — ABNORMAL LOW (ref 98–109)
Creatinine: 0.7 mg/dL (ref 0.7–1.3)
Creatinine: 0.8 mg/dL (ref 0.7–1.3)
GLUCOSE: 141 mg/dL — AB (ref 70–140)
Glucose: 134 mg/dl (ref 70–140)
Potassium: 1.9 mEq/L — CL (ref 3.5–5.1)
Sodium: 151 mEq/L — ABNORMAL HIGH (ref 136–145)
Sodium: 151 mEq/L — ABNORMAL HIGH (ref 136–145)
Total Bilirubin: 2.31 mg/dL — ABNORMAL HIGH (ref 0.20–1.20)
Total Protein: 6.3 g/dL — ABNORMAL LOW (ref 6.4–8.3)
Total Protein: 6.8 g/dL (ref 6.4–8.3)

## 2013-10-02 MED ORDER — DEXAMETHASONE SODIUM PHOSPHATE 10 MG/ML IJ SOLN
10.0000 mg | Freq: Once | INTRAMUSCULAR | Status: AC
  Administered 2013-10-02: 10 mg via INTRAVENOUS

## 2013-10-02 MED ORDER — OXALIPLATIN CHEMO INJECTION 100 MG/20ML
85.0000 mg/m2 | Freq: Once | INTRAVENOUS | Status: DC
  Filled 2013-10-02: qty 27

## 2013-10-02 MED ORDER — SODIUM CHLORIDE 0.9 % IJ SOLN
10.0000 mL | INTRAMUSCULAR | Status: DC | PRN
  Administered 2013-10-02: 10 mL via INTRAVENOUS
  Filled 2013-10-02: qty 10

## 2013-10-02 MED ORDER — DEXAMETHASONE SODIUM PHOSPHATE 10 MG/ML IJ SOLN
INTRAMUSCULAR | Status: AC
  Filled 2013-10-02: qty 1

## 2013-10-02 MED ORDER — ONDANSETRON 8 MG/NS 50 ML IVPB
INTRAVENOUS | Status: AC
  Filled 2013-10-02: qty 8

## 2013-10-02 MED ORDER — LOPERAMIDE HCL 2 MG PO CAPS: 4.0000 mg | ORAL_CAPSULE | ORAL | Status: AC | PRN

## 2013-10-02 MED ORDER — POTASSIUM CHLORIDE 20 MEQ/15ML (10%) PO SOLN: 20.0000 meq | Freq: Two times a day (BID) | ORAL | Status: AC

## 2013-10-02 MED ORDER — FLUOROURACIL CHEMO INJECTION 2.5 GM/50ML
400.0000 mg/m2 | Freq: Once | INTRAVENOUS | Status: DC
Start: 2013-10-02 — End: 2013-10-02
  Filled 2013-10-02: qty 13

## 2013-10-02 MED ORDER — VITAL AF 1.2 CAL PO LIQD: ORAL | Status: AC

## 2013-10-02 MED ORDER — DEXTROSE 5 % IV SOLN
Freq: Once | INTRAVENOUS | Status: AC
  Administered 2013-10-02: 13:00:00 via INTRAVENOUS

## 2013-10-02 MED ORDER — HEPARIN SOD (PORK) LOCK FLUSH 100 UNIT/ML IV SOLN
500.0000 [IU] | Freq: Once | INTRAVENOUS | Status: AC
  Administered 2013-10-02: 250 [IU] via INTRAVENOUS
  Filled 2013-10-02: qty 5

## 2013-10-02 MED ORDER — SODIUM CHLORIDE 0.45 % IV SOLN
INTRAVENOUS | Status: DC
  Administered 2013-10-02: 14:00:00 via INTRAVENOUS
  Filled 2013-10-02: qty 1000

## 2013-10-02 MED ORDER — FLUOROURACIL CHEMO INJECTION 5 GM/100ML
2400.0000 mg/m2 | INTRAVENOUS | Status: DC
  Filled 2013-10-02: qty 77

## 2013-10-02 MED ORDER — ONDANSETRON 8 MG/50ML IVPB (CHCC)
8.0000 mg | Freq: Once | INTRAVENOUS | Status: AC
  Administered 2013-10-02: 8 mg via INTRAVENOUS

## 2013-10-02 MED ORDER — LEUCOVORIN CALCIUM INJECTION 350 MG
400.0000 mg/m2 | Freq: Once | INTRAMUSCULAR | Status: DC
  Filled 2013-10-02: qty 32.2

## 2013-10-02 MED ORDER — SODIUM CHLORIDE 0.9 % IJ SOLN
3.0000 mL | INTRAMUSCULAR | Status: DC | PRN
  Administered 2013-10-02: 3 mL via INTRAVENOUS
  Filled 2013-10-02: qty 10

## 2013-10-02 MED ORDER — HEPARIN SOD (PORK) LOCK FLUSH 100 UNIT/ML IV SOLN
250.0000 [IU] | Freq: Once | INTRAVENOUS | Status: AC | PRN
  Administered 2013-10-02: 250 [IU]
  Filled 2013-10-02: qty 5

## 2013-10-02 NOTE — Patient Instructions (Signed)
PICC Home Guide A peripherally inserted central catheter (PICC) is a long, thin, flexible tube that is inserted into a vein in the upper arm. It is a form of intravenous (IV) access. It is considered to be a "central" line because the tip of the PICC ends in a large vein in your chest. This large vein is called the superior vena cava (SVC). The PICC tip ends in the SVC because there is a lot of blood flow in the SVC. This allows medicines and IV fluids to be quickly distributed throughout the body. The PICC is inserted using a sterile technique by a specially trained nurse or physician. After the PICC is inserted, a chest X-ray exam is done to be sure it is in the correct place.  A PICC may be placed for different reasons, such as:  To give medicines and liquid nutrition that can only be given through a central line. Examples are:  Certain antibiotic treatments.  Chemotherapy.  Total parenteral nutrition (TPN).  To take frequent blood samples.  To give IV fluids and blood products.  If there is difficulty placing a peripheral intravenous (PIV) catheter. If taken care of properly, a PICC can remain in place for several months. A PICC can also allow a person to go home from the hospital early. Medicine and PICC care can be managed at home by a family member or home health care team. WHAT PROBLEMS CAN HAPPEN WHEN I HAVE A PICC? Problems with a PICC can occasionally occur. These may include:  A blood clot (thrombus) forming in or at the tip of the PICC. This can cause the PICC to become clogged. A clot-dissolving medicine called tissue plasminogen activator (tPA) can be given through the PICC to help break up the clot.  Inflammation of the vein (phlebitis) in which the PICC is placed. Signs of inflammation may include redness, pain at the insertion site, red streaks, or being able to feel a "cord" in the vein where the PICC is located.  Infection in the PICC or at the insertion site. Signs of  infection may include fever, chills, redness, swelling, or pus drainage from the PICC insertion site.  PICC movement (malposition). The PICC tip may move from its original position due to excessive physical activity, forceful coughing, sneezing, or vomiting.  A break or cut in the PICC. It is important to not use scissors near the PICC.  Nerve or tendon irritation or injury during PICC insertion. WHAT SHOULD I KEEP IN MIND ABOUT ACTIVITIES WHEN I HAVE A PICC?  You may bend your arm and move it freely. If your PICC is near or at the bend of your elbow, avoid activity with repeated motion at the elbow.  Rest at home for the remainder of the day following PICC line insertion.  Avoid lifting heavy objects as instructed by your health care provider.  Avoid using a crutch with the arm on the same side as your PICC. You may need to use a walker. WHAT SHOULD I KNOW ABOUT MY PICC DRESSING?  Keep your PICC bandage (dressing) clean and dry to prevent infection.  Ask your health care provider when you may shower. Ask your health care provider to teach you how to wrap the PICC when you do take a shower.  Change the PICC dressing as instructed by your health care provider.  Change your PICC dressing if it becomes loose or wet. WHAT SHOULD I KNOW ABOUT PICC CARE?  Check the PICC insertion site daily for   leakage, redness, swelling, or pain.  Do not take a bath, swim, or use hot tubs when you have a PICC. Cover PICC line with clear plastic wrap and tape to keep it dry while showering.  Flush the PICC as directed by your health care provider. Let your health care provider know right away if the PICC is difficult to flush or does not flush. Do not use force to flush the PICC.  Do not use a syringe that is less than 10 mL to flush the PICC.  Never pull or tug on the PICC.  Avoid blood pressure checks on the arm with the PICC.  Keep your PICC identification card with you at all times.  Do not  take the PICC out yourself. Only a trained clinical professional should remove the PICC. SEEK IMMEDIATE MEDICAL CARE IF:  Your PICC is accidently pulled all the way out. If this happens, cover the insertion site with a bandage or gauze dressing. Do not throw the PICC away. Your health care provider will need to inspect it.  Your PICC was tugged or pulled and has partially come out. Do not  push the PICC back in.  There is any type of drainage, redness, or swelling where the PICC enters the skin.  You cannot flush the PICC, it is difficult to flush, or the PICC leaks around the insertion site when it is flushed.  You hear a "flushing" sound when the PICC is flushed.  You have pain, discomfort, or numbness in your arm, shoulder, or jaw on the same side as the PICC.  You feel your heart "racing" or skipping beats.  You notice a hole or tear in the PICC.  You develop chills or a fever. MAKE SURE YOU:   Understand these instructions.  Will watch your condition.  Will get help right away if you are not doing well or get worse. Document Released: 03/12/2003 Document Revised: 06/26/2013 Document Reviewed: 05/13/2013 Exit[employer] Patient Information 2014 [employer].  

## 2013-10-02 NOTE — Progress Notes (Signed)
[place], MD,  [name]  [place], [place]    [place] Phone:  [phone] FAX:  [phone]   Re:   Randy Le DOB:   09-Apr-1956 MRN:   [id number]  ASSESSMENT AND PLAN: 1. 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  Open JEJUNOSTOMY/GASTROSTOMY TUBE for advanced unresectable diffuse type gastric cancer - D. Kareen Hitsman - 09/09/2013   Tube feedings at 90 cc/hr x 18 hours per day.  He is tolerating this well.  Followed by Dr. Benay Spice.  His long term prognosis is poor.  Will return PRN.  But I will see him for any problem.  1A.  Gastrostomy tube looks good.  I cut the suture.  The tube is a Malecot tube and should stay in place. 1B.  Jejunostomy tube.  This is a red rubber tube.  The tube has broken once and he has cut it back.  He has a heavy device at the end - he is going to talk to the nurses at the cancer center about changing this out to a single Christmas tree device.   2. Severe cal/protein malnutrition - now on J tube feedings.  HISTORY OF PRESENT ILLNESS: Chief Complaint  Patient presents with  . Routine Post Op    j-tube /feeding tuberecheck    Randy Le is a 58 y.o. (DOB: 01/07/1956)  white  male who is a patient of GOLDING, Betsy Coder, MD and comes to me today for follow up of obstructing gastric cancer.  He presented with an obstructing gastric cancer.  He was seen by several [facility] surgeons, but I ended up exploring him.  Because of the size of the cancer, he was really only a candidate for gastrostomy tube and jejunostomy tube.  He has tolerated these well so far.  He is started chemotx today at [facility] supervised by Dr. Jacinto Reap. [place]. His wife is with him. We talked about managing the J tube.  He is going to talk to the nurses at the hospital about trying to change this end.  Past Medical History  Diagnosis Date  . Hypertension   . Reflux     SOCIAL HISTORY:   PHYSICAL EXAM: BP  126/70  Pulse 62  Temp(Src) 98 F (36.7 C)  Resp 18  Ht 5\' 9"  (1.753 m)  Wt 110 lb (49.896 kg)  BMI 16.24 kg/m2  General: Very thin WM who is alert and generally healthy appearing.  HEENT: Normal. Pupils equal.  Neck: Supple. No mass.   Lungs: Clear to auscultation and symmetric breath sounds. Heart:  RRR. No murmur or rub. Abdomen: Soft. No mass. Staples in place, which I removed.  Gastrostomy tube in LUQ.  I cut the suture holding this.  The tube will move but be okay. Jejunostomy tube left mid abdomen.   Rectal: Not done.  DATA REVIEWED: Path report and Epic notes.  Alphonsa Overall, MD,  [facility], [address]  [place], [place]    [place] Phone:  [phone] FAX:  [phone]

## 2013-10-02 NOTE — Progress Notes (Signed)
AVS given to patient and wife, thoroughly explained next appt and importance of obtaining potassium supplement.

## 2013-10-02 NOTE — Progress Notes (Signed)
Dr. Benay Spice made aware of K+/Na+ levels today--will hold tx today and reschedule for 1/19. Will repeat labs on 1/16 and see Ned Card, NP. Start 27meq KCL tid today at home via tube and bid afterwards. Take Imodium for diarrhea. Patient/wife notified. Dietician to review feeding and free water needs. Made his home health nurse, Miranda aware to change in plan. Told her we will change his dressing and caps on Monday when here for tx, so she will not need to make home visit that week. Confirmed that [facility] is open till 8pm tonight and they have his K+ in stock. The [facility] he had requested does not have the drug in stock-would need to be ordered.

## 2013-10-02 NOTE — Progress Notes (Signed)
[place] Work  Clinical Social Work met with Pt in chemo room as a follow up visit from last week. Pt and wife report to be making progress on ss disability application and wife states she is almost through filling out the papers. CSW provided Pt and wife with a release of information form that they will need in order to complete the application. Pt and wife appear to be slowly adjusting to all their recent life changes. They agree to reach out when they return later this week if they have additional needs. They appear in good spirits today.   Clinical Social Work interventions: Disability application guidance  Loren Racer, [place] Social Worker [facility] [place] for [place] Wednesday, Thursday and Friday Phone: [phone] Fax: [phone]

## 2013-10-02 NOTE — Telephone Encounter (Signed)
Per desk RN voicemail, I have scheduled treatment for next week

## 2013-10-02 NOTE — Progress Notes (Signed)
Nutrition followup completed with patient and wife in the chemotherapy room.  Patient is receiving IV Fluids today.  Patient is diagnosed with gastric cancer.  He has a history of a partial small bowel obstruction and exploratory laparotomy completed in 2012, with 2-3 feet of the small bowel removed.  Patient states he has had diarrhea since his small bowel was removed.  He is receiving FOLFOX. Patient has a feeding jejunostomy, which was placed in December with tube feeding goal achieved on December 27. Patient's tube feeding is a peptide-based formula for patients, who may experienced malabsorption.  Tube feedings at goal for 2 1/2 weeks.  Patient has G-tube to drainage.  Patient denies changes in bowel movements.  Denies nausea and vomiting.  Estimated nutrition needs: 2000-2200 calories, 105-120 g protein, 2.2 L fluid.  Patient taking liquids by mouth which are draining through the gastrostomy tube.  His intake by mouth is not contributing to estimated needs.  Patient using vital AF 1.2 via jejunostomy tube at 90 mL for 16 hours daily with 120 mL free water 5 times daily providing 1728 calories, 108 g protein, 1767 mL free water.   Weight on January 14, documented as 110 pounds decreased from 118.9 pounds on January 8. Labs: Potassium 1.8, sodium 151, glucose 141.  Nutrition diagnosis: Unintended weight loss continues.  Intervention: Patient educated to increase jejunostomy feedings of vital AF to 120 mL an hour for 16 hours daily as tolerated.  Patient will also increase free water flushes to 180 mL 5 times a day.  This will provide 2304 calories, 144 g protein, 2457 mL free water.  100% of estimated needs.  Tube feeding and free water will be adjusted as needed. Recommend adding phosphorus to lab draw on Friday.  Spoke with NP.  Monitoring, evaluation, goals: Patient will tolerate tube feedings with free water flushes to promote weight gain.  Oral intake for pleasure.  Next visit: Friday,  January 16.

## 2013-10-02 NOTE — Patient Instructions (Addendum)
Dr. Benay Spice has determined you are dehydrated and your potassium level is at a dangerously low level. It is being replaced in your IV and will require you to continue this at home via your J-tube. He wants you to take 20 meq= 15 ml three times today and then twice daily until further notice. You also need to take Imodium 4 mg (over the counter) four times daily to slow down your diarrhea. The dietician will talk with you about your feeding and free water needed to stay hydrated. You are to return to clinic on Friday for lab and to see the nurse practioner and Dr. Benay Spice. We will reschedule your chemo treatment for Monday, 10/07/13 (our office will change your PICC dressing and change caps-I will call your home health nurse).   Hypokalemia Hypokalemia means that the amount of potassium in the blood is lower than normal.Potassium is a chemical, called an electrolyte, that helps regulate the amount of fluid in the body. It also stimulates muscle contraction and helps nerves function properly.Most of the body's potassium is inside of cells, and only a very small amount is in the blood. Because the amount in the blood is so small, minor changes can be life-threatening. CAUSES  Antibiotics.  Diarrhea or vomiting.  Using laxatives too much, which can cause diarrhea.  Chronic kidney disease.  Water pills (diuretics).  Eating disorders (bulimia).  Low magnesium level.  Sweating a lot. SIGNS AND SYMPTOMS  Weakness.  Constipation.  Fatigue.  Muscle cramps.  Mental confusion.  Skipped heartbeats or irregular heartbeat (palpitations).  Tingling or numbness. DIAGNOSIS  Your health care provider can diagnose hypokalemia with blood tests. In addition to checking your potassium level, your health care provider may also check other lab tests. TREATMENT Hypokalemia can be treated with potassium supplements taken by mouth or adjustments in your current medicines. If your potassium level is  very low, you may need to get potassium through a vein (IV) and be monitored in the hospital. A diet high in potassium is also helpful. Foods high in potassium are:  Nuts, such as peanuts and pistachios.  Seeds, such as sunflower seeds and pumpkin seeds.  Peas, lentils, and lima beans.  Whole grain and bran cereals and breads.  Fresh fruit and vegetables, such as apricots, avocado, bananas, cantaloupe, kiwi, oranges, tomatoes, asparagus, and potatoes.  Orange and tomato juices.  Red meats.  Fruit yogurt. HOME CARE INSTRUCTIONS  Take all medicines as prescribed by your health care provider.  Maintain a healthy diet by including nutritious food, such as fruits, vegetables, nuts, whole grains, and lean meats.  If you are taking a laxative, be sure to follow the directions on the label. SEEK MEDICAL CARE IF:  Your weakness gets worse.  You feel your heart pounding or racing.  You are vomiting or having diarrhea.  You are diabetic and having trouble keeping your blood glucose in the normal range. SEEK IMMEDIATE MEDICAL CARE IF:  You have chest pain, shortness of breath, or dizziness.  You are vomiting or having diarrhea for more than 2 days.  You faint. MAKE SURE YOU:   Understand these instructions.  Will watch your condition.  Will get help right away if you are not doing well or get worse. Document Released: 09/05/2005 Document Revised: 06/26/2013 Document Reviewed: 03/08/2013 [facility] Patient Information 2014 [place].  Dehydration, Adult Dehydration means your body does not have as much fluid as it needs. Your kidneys, brain, and heart will not work properly without the  right amount of fluids and salt.  HOME CARE  Ask your doctor how to replace body fluid losses (rehydrate).  Drink enough fluids to keep your pee (urine) clear or pale yellow.  Drink small amounts of fluids often if you feel sick to your stomach (nauseous) or throw up (vomit).  Eat  like you normally do.  Avoid:  Foods or drinks high in sugar.  Bubbly (carbonated) drinks.  Juice.  Very hot or cold fluids.  Drinks with caffeine.  Fatty, greasy foods.  Alcohol.  Tobacco.  Eating too much.  Gelatin desserts.  Wash your hands to avoid spreading germs (bacteria, viruses).  Only take medicine as told by your doctor.  Keep all doctor visits as told. GET HELP RIGHT AWAY IF:   You cannot drink something without throwing up.  You get worse even with treatment.  Your vomit has blood in it or looks greenish.  Your poop (stool) has blood in it or looks black and tarry.  You have not peed in 6 to 8 hours.  You pee a small amount of very dark pee.  You have a fever.  You pass out (faint).  You have belly (abdominal) pain that gets worse or stays in one spot (localizes).  You have a rash, stiff neck, or bad headache.  You get easily annoyed, sleepy, or are hard to wake up.  You feel weak, dizzy, or very thirsty. MAKE SURE YOU:   Understand these instructions.  Will watch your condition.  Will get help right away if you are not doing well or get worse. Document Released: 07/02/2009 Document Revised: 11/28/2011 Document Reviewed: 04/25/2011 [facility] Patient Information 2014 [place], [place].  When you pick up your potassium from [place], ask the staff about foley drainage bags and if you can obtain them from there.

## 2013-10-03 ENCOUNTER — Telehealth: Payer: Self-pay | Admitting: *Deleted

## 2013-10-03 LAB — CEA: CEA: 15.5 ng/mL — ABNORMAL HIGH (ref 0.0–5.0)

## 2013-10-03 NOTE — Telephone Encounter (Signed)
I have called and moved his flush appt  Up to follow huis MD appt. Patient aware

## 2013-10-04 ENCOUNTER — Ambulatory Visit (HOSPITAL_BASED_OUTPATIENT_CLINIC_OR_DEPARTMENT_OTHER): Payer: Federal, State, Local not specified - PPO

## 2013-10-04 ENCOUNTER — Ambulatory Visit (HOSPITAL_BASED_OUTPATIENT_CLINIC_OR_DEPARTMENT_OTHER): Payer: Federal, State, Local not specified - PPO | Admitting: Nurse Practitioner

## 2013-10-04 ENCOUNTER — Other Ambulatory Visit (HOSPITAL_BASED_OUTPATIENT_CLINIC_OR_DEPARTMENT_OTHER): Payer: Federal, State, Local not specified - PPO

## 2013-10-04 ENCOUNTER — Other Ambulatory Visit: Payer: Self-pay | Admitting: *Deleted

## 2013-10-04 ENCOUNTER — Ambulatory Visit: Payer: Federal, State, Local not specified - PPO

## 2013-10-04 ENCOUNTER — Telehealth: Payer: Self-pay | Admitting: Oncology

## 2013-10-04 ENCOUNTER — Ambulatory Visit: Payer: Federal, State, Local not specified - PPO | Admitting: Nutrition

## 2013-10-04 VITALS — BP 111/63 | HR 108 | Temp 96.4°F | Resp 18 | Ht 69.0 in | Wt 110.6 lb

## 2013-10-04 DIAGNOSIS — C169 Malignant neoplasm of stomach, unspecified: Secondary | ICD-10-CM

## 2013-10-04 DIAGNOSIS — K311 Adult hypertrophic pyloric stenosis: Secondary | ICD-10-CM

## 2013-10-04 DIAGNOSIS — E860 Dehydration: Secondary | ICD-10-CM

## 2013-10-04 DIAGNOSIS — E46 Unspecified protein-calorie malnutrition: Secondary | ICD-10-CM

## 2013-10-04 DIAGNOSIS — R188 Other ascites: Secondary | ICD-10-CM

## 2013-10-04 DIAGNOSIS — E876 Hypokalemia: Secondary | ICD-10-CM

## 2013-10-04 DIAGNOSIS — Z452 Encounter for adjustment and management of vascular access device: Secondary | ICD-10-CM

## 2013-10-04 LAB — CBC WITH DIFFERENTIAL/PLATELET
BASO%: 0.4 % (ref 0.0–2.0)
Basophils Absolute: 0 10*3/uL (ref 0.0–0.1)
EOS%: 0.2 % (ref 0.0–7.0)
Eosinophils Absolute: 0 10*3/uL (ref 0.0–0.5)
HEMATOCRIT: 29.8 % — AB (ref 38.4–49.9)
HGB: 10 g/dL — ABNORMAL LOW (ref 13.0–17.1)
LYMPH%: 6.9 % — AB (ref 14.0–49.0)
MCH: 32.6 pg (ref 27.2–33.4)
MCHC: 33.5 g/dL (ref 32.0–36.0)
MCV: 97.2 fL (ref 79.3–98.0)
MONO#: 0.5 10*3/uL (ref 0.1–0.9)
MONO%: 4.3 % (ref 0.0–14.0)
NEUT#: 9.8 10*3/uL — ABNORMAL HIGH (ref 1.5–6.5)
NEUT%: 88.2 % — AB (ref 39.0–75.0)
PLATELETS: 228 10*3/uL (ref 140–400)
RBC: 3.07 10*6/uL — ABNORMAL LOW (ref 4.20–5.82)
RDW: 15.9 % — ABNORMAL HIGH (ref 11.0–14.6)
WBC: 11.1 10*3/uL — ABNORMAL HIGH (ref 4.0–10.3)
lymph#: 0.8 10*3/uL — ABNORMAL LOW (ref 0.9–3.3)

## 2013-10-04 LAB — COMPREHENSIVE METABOLIC PANEL (CC13)
ALBUMIN: 2.2 g/dL — AB (ref 3.5–5.0)
ALK PHOS: 446 U/L — AB (ref 40–150)
ALT: 114 U/L — ABNORMAL HIGH (ref 0–55)
AST: 82 U/L — AB (ref 5–34)
Anion Gap: 18 mEq/L — ABNORMAL HIGH (ref 3–11)
BUN: 33.1 mg/dL — ABNORMAL HIGH (ref 7.0–26.0)
CO2: 44 mEq/L — ABNORMAL HIGH (ref 22–29)
Calcium: 9.1 mg/dL (ref 8.4–10.4)
Chloride: 89 mEq/L — ABNORMAL LOW (ref 98–109)
Creatinine: 0.7 mg/dL (ref 0.7–1.3)
GLUCOSE: 139 mg/dL (ref 70–140)
POTASSIUM: 2.1 meq/L — AB (ref 3.5–5.1)
Sodium: 151 mEq/L — ABNORMAL HIGH (ref 136–145)
TOTAL PROTEIN: 6 g/dL — AB (ref 6.4–8.3)
Total Bilirubin: 3.94 mg/dL — ABNORMAL HIGH (ref 0.20–1.20)

## 2013-10-04 LAB — PHOSPHORUS: Phosphorus: 3 mg/dL (ref 2.3–4.6)

## 2013-10-04 MED ORDER — SODIUM CHLORIDE 0.45 % IV SOLN
INTRAVENOUS | Status: DC
  Administered 2013-10-04: 11:00:00 via INTRAVENOUS
  Filled 2013-10-04: qty 1000

## 2013-10-04 MED ORDER — SODIUM CHLORIDE 0.9 % IJ SOLN
10.0000 mL | INTRAMUSCULAR | Status: DC | PRN
  Administered 2013-10-04: 10 mL via INTRAVENOUS
  Filled 2013-10-04: qty 10

## 2013-10-04 MED ORDER — HEPARIN SOD (PORK) LOCK FLUSH 100 UNIT/ML IV SOLN
250.0000 [IU] | INTRAVENOUS | Status: AC | PRN
Start: 2013-10-04 — End: 2013-10-04
  Administered 2013-10-04: 250 [IU]
  Filled 2013-10-04: qty 5

## 2013-10-04 MED ORDER — HEPARIN SOD (PORK) LOCK FLUSH 100 UNIT/ML IV SOLN
500.0000 [IU] | Freq: Once | INTRAVENOUS | Status: AC
  Administered 2013-10-04: 250 [IU] via INTRAVENOUS
  Filled 2013-10-04: qty 5

## 2013-10-04 MED ORDER — SODIUM CHLORIDE 0.9 % IJ SOLN
10.0000 mL | INTRAMUSCULAR | Status: AC | PRN
Start: 2013-10-04 — End: 2013-10-04
  Administered 2013-10-04: 10 mL
  Filled 2013-10-04: qty 10

## 2013-10-04 NOTE — Patient Instructions (Signed)
Dehydration, Adult Dehydration is when you lose more fluids from the body than you take in. Vital organs like the kidneys, brain, and heart cannot function without a proper amount of fluids and salt. Any loss of fluids from the body can cause dehydration.  CAUSES   Vomiting.  Diarrhea.  Excessive sweating.  Excessive urine output.  Fever. SYMPTOMS  Mild dehydration  Thirst.  Dry lips.  Slightly dry mouth. Moderate dehydration  Very dry mouth.  Sunken eyes.  Skin does not bounce back quickly when lightly pinched and released.  Dark urine and decreased urine production.  Decreased tear production.  Headache. Severe dehydration  Very dry mouth.  Extreme thirst.  Rapid, weak pulse (more than 100 beats per minute at rest).  Cold hands and feet.  Not able to sweat in spite of heat and temperature.  Rapid breathing.  Blue lips.  Confusion and lethargy.  Difficulty being awakened.  Minimal urine production.  No tears. DIAGNOSIS  Your caregiver will diagnose dehydration based on your symptoms and your exam. Blood and urine tests will help confirm the diagnosis. The diagnostic evaluation should also identify the cause of dehydration. TREATMENT  Treatment of mild or moderate dehydration can often be done at home by increasing the amount of fluids that you drink. It is best to drink small amounts of fluid more often. Drinking too much at one time can make vomiting worse. Refer to the home care instructions below. Severe dehydration needs to be treated at the hospital where you will probably be given intravenous (IV) fluids that contain water and electrolytes. HOME CARE INSTRUCTIONS   Ask your caregiver about specific rehydration instructions.  Drink enough fluids to keep your urine clear or pale yellow.  Drink small amounts frequently if you have nausea and vomiting.  Eat as you normally do.  Avoid:  Foods or drinks high in sugar.  Carbonated  drinks.  Juice.  Extremely hot or cold fluids.  Drinks with caffeine.  Fatty, greasy foods.  Alcohol.  Tobacco.  Overeating.  Gelatin desserts.  Wash your hands well to avoid spreading bacteria and viruses.  Only take over-the-counter or prescription medicines for pain, discomfort, or fever as directed by your caregiver.  Ask your caregiver if you should continue all prescribed and over-the-counter medicines.  Keep all follow-up appointments with your caregiver. SEEK MEDICAL CARE IF:  You have abdominal pain and it increases or stays in one area (localizes).  You have a rash, stiff neck, or severe headache.  You are irritable, sleepy, or difficult to awaken.  You are weak, dizzy, or extremely thirsty. SEEK IMMEDIATE MEDICAL CARE IF:   You are unable to keep fluids down or you get worse despite treatment.  You have frequent episodes of vomiting or diarrhea.  You have blood or green matter (bile) in your vomit.  You have blood in your stool or your stool looks black and tarry.  You have not urinated in 6 to 8 hours, or you have only urinated a small amount of very dark urine.  You have a fever.  You faint. MAKE SURE YOU:   Understand these instructions.  Will watch your condition.  Will get help right away if you are not doing well or get worse. Document Released: 09/05/2005 Document Revised: 11/28/2011 Document Reviewed: 04/25/2011 [employer] Patient Information 2014 [employer]  Hypokalemia Hypokalemia means that the amount of potassium in the blood is lower than normal.Potassium is a chemical, called an electrolyte, that helps regulate the amount of fluid  in the body. It also stimulates muscle contraction and helps nerves function properly.Most of the body's potassium is inside of cells, and only a very small amount is in the blood. Because the amount in the blood is so small, minor changes can be life-threatening. CAUSES  Antibiotics.  Diarrhea or  vomiting.  Using laxatives too much, which can cause diarrhea.  Chronic kidney disease.  Water pills (diuretics).  Eating disorders (bulimia).  Low magnesium level.  Sweating a lot. SIGNS AND SYMPTOMS  Weakness.  Constipation.  Fatigue.  Muscle cramps.  Mental confusion.  Skipped heartbeats or irregular heartbeat (palpitations).  Tingling or numbness. DIAGNOSIS  Your health care provider can diagnose hypokalemia with blood tests. In addition to checking your potassium level, your health care provider may also check other lab tests. TREATMENT Hypokalemia can be treated with potassium supplements taken by mouth or adjustments in your current medicines. If your potassium level is very low, you may need to get potassium through a vein (IV) and be monitored in the hospital. A diet high in potassium is also helpful. Foods high in potassium are:  Nuts, such as peanuts and pistachios.  Seeds, such as sunflower seeds and pumpkin seeds.  Peas, lentils, and lima beans.  Whole grain and bran cereals and breads.  Fresh fruit and vegetables, such as apricots, avocado, bananas, cantaloupe, kiwi, oranges, tomatoes, asparagus, and potatoes.  Orange and tomato juices.  Red meats.  Fruit yogurt. HOME CARE INSTRUCTIONS  Take all medicines as prescribed by your health care provider.  Maintain a healthy diet by including nutritious food, such as fruits, vegetables, nuts, whole grains, and lean meats.  If you are taking a laxative, be sure to follow the directions on the label. SEEK MEDICAL CARE IF:  Your weakness gets worse.  You feel your heart pounding or racing.  You are vomiting or having diarrhea.  You are diabetic and having trouble keeping your blood glucose in the normal range. SEEK IMMEDIATE MEDICAL CARE IF:  You have chest pain, shortness of breath, or dizziness.  You are vomiting or having diarrhea for more than 2 days.  You faint. MAKE SURE YOU:    Understand these instructions.  Will watch your condition.  Will get help right away if you are not doing well or get worse. Document Released: 09/05/2005 Document Revised: 06/26/2013 Document Reviewed: 03/08/2013 [facility] Patient Information 2014 [place].  It was my pleasure to take care of you today!  Leeanne Rio, RN

## 2013-10-04 NOTE — Patient Instructions (Signed)
PICC Home Guide A peripherally inserted central catheter (PICC) is a long, thin, flexible tube that is inserted into a vein in the upper arm. It is a form of intravenous (IV) access. It is considered to be a "central" line because the tip of the PICC ends in a large vein in your chest. This large vein is called the superior vena cava (SVC). The PICC tip ends in the SVC because there is a lot of blood flow in the SVC. This allows medicines and IV fluids to be quickly distributed throughout the body. The PICC is inserted using a sterile technique by a specially trained nurse or physician. After the PICC is inserted, a chest X-ray exam is done to be sure it is in the correct place.  A PICC may be placed for different reasons, such as:  To give medicines and liquid nutrition that can only be given through a central line. Examples are:  Certain antibiotic treatments.  Chemotherapy.  Total parenteral nutrition (TPN).  To take frequent blood samples.  To give IV fluids and blood products.  If there is difficulty placing a peripheral intravenous (PIV) catheter. If taken care of properly, a PICC can remain in place for several months. A PICC can also allow a person to go home from the hospital early. Medicine and PICC care can be managed at home by a family member or home health care team. WHAT PROBLEMS CAN HAPPEN WHEN I HAVE A PICC? Problems with a PICC can occasionally occur. These may include:  A blood clot (thrombus) forming in or at the tip of the PICC. This can cause the PICC to become clogged. A clot-dissolving medicine called tissue plasminogen activator (tPA) can be given through the PICC to help break up the clot.  Inflammation of the vein (phlebitis) in which the PICC is placed. Signs of inflammation may include redness, pain at the insertion site, red streaks, or being able to feel a "cord" in the vein where the PICC is located.  Infection in the PICC or at the insertion site. Signs of  infection may include fever, chills, redness, swelling, or pus drainage from the PICC insertion site.  PICC movement (malposition). The PICC tip may move from its original position due to excessive physical activity, forceful coughing, sneezing, or vomiting.  A break or cut in the PICC. It is important to not use scissors near the PICC.  Nerve or tendon irritation or injury during PICC insertion. WHAT SHOULD I KEEP IN MIND ABOUT ACTIVITIES WHEN I HAVE A PICC?  You may bend your arm and move it freely. If your PICC is near or at the bend of your elbow, avoid activity with repeated motion at the elbow.  Rest at home for the remainder of the day following PICC line insertion.  Avoid lifting heavy objects as instructed by your health care provider.  Avoid using a crutch with the arm on the same side as your PICC. You may need to use a walker. WHAT SHOULD I KNOW ABOUT MY PICC DRESSING?  Keep your PICC bandage (dressing) clean and dry to prevent infection.  Ask your health care provider when you may shower. Ask your health care provider to teach you how to wrap the PICC when you do take a shower.  Change the PICC dressing as instructed by your health care provider.  Change your PICC dressing if it becomes loose or wet. WHAT SHOULD I KNOW ABOUT PICC CARE?  Check the PICC insertion site daily for   leakage, redness, swelling, or pain.  Do not take a bath, swim, or use hot tubs when you have a PICC. Cover PICC line with clear plastic wrap and tape to keep it dry while showering.  Flush the PICC as directed by your health care provider. Let your health care provider know right away if the PICC is difficult to flush or does not flush. Do not use force to flush the PICC.  Do not use a syringe that is less than 10 mL to flush the PICC.  Never pull or tug on the PICC.  Avoid blood pressure checks on the arm with the PICC.  Keep your PICC identification card with you at all times.  Do not  take the PICC out yourself. Only a trained clinical professional should remove the PICC. SEEK IMMEDIATE MEDICAL CARE IF:  Your PICC is accidently pulled all the way out. If this happens, cover the insertion site with a bandage or gauze dressing. Do not throw the PICC away. Your health care provider will need to inspect it.  Your PICC was tugged or pulled and has partially come out. Do not  push the PICC back in.  There is any type of drainage, redness, or swelling where the PICC enters the skin.  You cannot flush the PICC, it is difficult to flush, or the PICC leaks around the insertion site when it is flushed.  You hear a "flushing" sound when the PICC is flushed.  You have pain, discomfort, or numbness in your arm, shoulder, or jaw on the same side as the PICC.  You feel your heart "racing" or skipping beats.  You notice a hole or tear in the PICC.  You develop chills or a fever. MAKE SURE YOU:   Understand these instructions.  Will watch your condition.  Will get help right away if you are not doing well or get worse. Document Released: 03/12/2003 Document Revised: 06/26/2013 Document Reviewed: 05/13/2013 Exit[employer] Patient Information 2014 [employer].  

## 2013-10-04 NOTE — Telephone Encounter (Signed)
Gave pt appt for 10/10/13 lab and ML, pt aware that chemo for 1/19 and 1/21 were cancelled

## 2013-10-04 NOTE — Progress Notes (Addendum)
OFFICE PROGRESS NOTE  Interval history:  Randy Le is a 58 year old man recently diagnosed with metastatic gastric cancer. He was scheduled to begin FOLFOX chemotherapy on 10/02/2013. Chemotherapy was placed on hold due to several lab abnormalities including sodium 151, potassium 1.9, chloride 90, bicarbonate 43, BUN 34.8, creatinine 0.7, bilirubin 2.19. He received intravenous hydration with potassium and was started on Kdur. Tube feedings and free water were adjusted per Randy Le, RD.  He presents today for scheduled followup. He denies nausea/vomiting. He reports baseline loose stools since bowel surgery in 2012. He denies pain. He is tolerating liquids. The gastrostomy tube remains open to a drainage bag.   Objective: Filed Vitals:   10/04/13 1010  BP: 111/63  Pulse: 108  Temp: 96.4 F (35.8 C)  Resp: 18   No thrush. Scleral icterus. Lungs are clear. Regular cardiac rhythm. Abdomen nontender. Trace lower leg edema bilaterally. Markedly decreased skin turgor. PICC site without erythema.   Lab Results: Lab Results  Component Value Date   WBC 11.1* 10/04/2013   HGB 10.0* 10/04/2013   HCT 29.8* 10/04/2013   MCV 97.2 10/04/2013   PLT 228 10/04/2013   NEUTROABS 9.8* 10/04/2013    Chemistry:    Chemistry      Component Value Date/Time   NA 151* 10/04/2013 0914   NA 137 09/14/2013 0540   K 2.1* 10/04/2013 0914   K 3.9 09/14/2013 0540   CL 103 09/14/2013 0540   CO2 44* 10/04/2013 0914   CO2 28 09/14/2013 0540   BUN 33.1* 10/04/2013 0914   BUN 16 09/14/2013 0540   CREATININE 0.7 10/04/2013 0914   CREATININE 0.55 09/14/2013 0540      Component Value Date/Time   CALCIUM 9.1 10/04/2013 0914   CALCIUM 8.4 09/14/2013 0540   ALKPHOS 446* 10/04/2013 0914   ALKPHOS 702* 09/12/2013 0530   AST 82* 10/04/2013 0914   AST 25 09/12/2013 0530   ALT 114* 10/04/2013 0914   ALT 84* 09/12/2013 0530   BILITOT 3.94* 10/04/2013 0914   BILITOT 2.8* 09/12/2013 0530       Studies/Results: No  results found.  Medications: I have reviewed the patient's current medications.  Assessment/Plan: 1. Gastric cancer, stage IV, tumor involving the distal one third of the stomach, transverse colon, mesentery of the transverse colon/small bowel, retroperitoneum and pelvic "drop metastases" identified at the time of surgery 09/09/2013. 2. 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 3. Malnutrition. Maintained on jejunostomy tube feedings. 4. Ascites. Likely malignant. Cytology not submitted on 09/09/2013. 5. COPD. 6. Elevated liver enzymes/bilirubin. Initially felt to be related to TNA administered while hospitalized December 2014. Labs initially improved. Bilirubin and transaminases more elevated on labs today. 7. Hypokalemia. Mild improvement. He will continue oral replacement and will also begin intravenous replacement. 8. Partial small bowel resection for treatment of a closed loop obstruction/ileum and infarction in July of 2012    Dispositon-he appears to be dehydrated. This is likely due to a combination of diarrhea and fluid loss via the gastrostomy tube. Free water is being administered via the J-tube in addition to the tube feedings.  Dr. Benay Spice recommends adding IV fluids on a daily basis to consist of one half normal saline with 20 meq of KCl per liter. We will administer IV fluids in the office today and then try to arrange for home administration beginning 10/05/2013.  [name] understands that chemotherapy cannot be initiated until the fluid deficit has  been corrected and there is improvement in laboratory values.  We will obtain repeat labs and see him in a followup visit on 10/10/2013. He will contact the office in the interim with any problems.  Patient seen with Dr. Benay Spice.  30 minutes were spent face-to-face at today's visit with the majority of that time involved in  counseling/coordination of care.  Randy Le ANP/GNP-BC   This was a shared visit with Randy Le. The chemistry panel was consistent with dehydration on 10/02/2013 and again today. I suspect the dehydration is related to fluid loss from the gastrostomy tube and diarrhea. We increased the free water in the jejunostomy tube feedings and he will receive intravenous fluids at home. The potassium will be repleted. We cannot start chemotherapy until his volume status is better.  Randy Le, M.D.

## 2013-10-04 NOTE — Progress Notes (Signed)
Followup completed with patient and wife in the exam room.  Patient reports he has tolerated tube feeding advancement of peptide-based formula, Vital AF from 90 mL an hour to goal of 120 mL an hour.  He runs formula for 16 hours daily through his feeding jejunostomy tube.  He has increased his free water  To 180 cc free water 5 times daily.  Patient reports diarrhea.  States he had 4 stools yesterday.  He is unable to give me more specifics as it varies every day.  Labs reviewed and noted, potassium increased slightly to 2.1, sodium remains at 151.  No new weight taken today.  Estimated nutrition needs: 2000-2200 calories, 105-120 g protein, 2.2 L fluid.  Vital AF 1.2 via jejunostomy tube 120 mL an hour for 16 hours daily with 180 mL free water 5 times a day provides 2304 calories, 144 g protein, 2457 mL free water.  This is 100% of estimated nutrition needs.  Nutrition diagnosis: Unintended weight loss cannot be evaluated.  Intervention: Patient to receive IV fluids on a daily basis per M.D.to correct dehydration.  Patient will continue vital AF at 120 mL for 16 hours daily along with 180 cc of free water 5 times daily.   Monitoring, evaluation, goals: Patient will see an improvement in dehydration as well as weight gain with tube feeding at goal rate.  Next visit:Next week when patient comes for follow up (?Thursday, 1/22)

## 2013-10-07 ENCOUNTER — Inpatient Hospital Stay: Payer: Federal, State, Local not specified - PPO

## 2013-10-07 ENCOUNTER — Telehealth: Payer: Self-pay | Admitting: *Deleted

## 2013-10-07 ENCOUNTER — Other Ambulatory Visit: Payer: Self-pay | Admitting: Hematology & Oncology

## 2013-10-07 DIAGNOSIS — E871 Hypo-osmolality and hyponatremia: Secondary | ICD-10-CM

## 2013-10-07 DIAGNOSIS — C169 Malignant neoplasm of stomach, unspecified: Secondary | ICD-10-CM

## 2013-10-07 NOTE — Telephone Encounter (Signed)
MD review of Bmet results: change IVF to 1/2NS with 40 meq KCL/liter daily-start tomorrow. Notified pharmacy--Dr. Marin Olp had already called same order in. Also Dr. Benay Spice wants patient to increase his K+ three times/day in his J-tube. Will also check his magnesium on 1/22 visit. Notified wife of new orders.

## 2013-10-10 ENCOUNTER — Telehealth: Payer: Self-pay | Admitting: Oncology

## 2013-10-10 ENCOUNTER — Ambulatory Visit (HOSPITAL_COMMUNITY)
Admission: RE | Admit: 2013-10-10 | Discharge: 2013-10-10 | Disposition: A | Discharge status: Home / Self Care | Payer: Federal, State, Local not specified - PPO | Source: Ambulatory Visit | Attending: Nurse Practitioner | Admitting: Nurse Practitioner

## 2013-10-10 ENCOUNTER — Other Ambulatory Visit (HOSPITAL_BASED_OUTPATIENT_CLINIC_OR_DEPARTMENT_OTHER): Payer: Federal, State, Local not specified - PPO

## 2013-10-10 ENCOUNTER — Inpatient Hospital Stay (HOSPITAL_COMMUNITY)
Admission: EM | Admit: 2013-10-10 | Discharge: 2013-10-11 | DRG: 444 | Disposition: A | Discharge status: Hospice | Payer: Federal, State, Local not specified - PPO | Attending: Internal Medicine | Admitting: Internal Medicine

## 2013-10-10 ENCOUNTER — Encounter (HOSPITAL_COMMUNITY): Payer: Self-pay | Admitting: Emergency Medicine

## 2013-10-10 ENCOUNTER — Other Ambulatory Visit: Payer: Self-pay

## 2013-10-10 ENCOUNTER — Ambulatory Visit (HOSPITAL_BASED_OUTPATIENT_CLINIC_OR_DEPARTMENT_OTHER): Payer: Federal, State, Local not specified - PPO | Admitting: Nurse Practitioner

## 2013-10-10 VITALS — BP 93/59 | HR 109 | Resp 19 | Ht 69.0 in | Wt 108.1 lb

## 2013-10-10 DIAGNOSIS — C801 Malignant (primary) neoplasm, unspecified: Secondary | ICD-10-CM

## 2013-10-10 DIAGNOSIS — E876 Hypokalemia: Secondary | ICD-10-CM

## 2013-10-10 DIAGNOSIS — E860 Dehydration: Secondary | ICD-10-CM

## 2013-10-10 DIAGNOSIS — Z681 Body mass index (BMI) 19 or less, adult: Secondary | ICD-10-CM

## 2013-10-10 DIAGNOSIS — J4489 Other specified chronic obstructive pulmonary disease: Secondary | ICD-10-CM | POA: Diagnosis present

## 2013-10-10 DIAGNOSIS — D638 Anemia in other chronic diseases classified elsewhere: Secondary | ICD-10-CM

## 2013-10-10 DIAGNOSIS — C169 Malignant neoplasm of stomach, unspecified: Secondary | ICD-10-CM

## 2013-10-10 DIAGNOSIS — F17200 Nicotine dependence, unspecified, uncomplicated: Secondary | ICD-10-CM | POA: Diagnosis present

## 2013-10-10 DIAGNOSIS — R188 Other ascites: Secondary | ICD-10-CM

## 2013-10-10 DIAGNOSIS — Z931 Gastrostomy status: Secondary | ICD-10-CM

## 2013-10-10 DIAGNOSIS — Z9049 Acquired absence of other specified parts of digestive tract: Secondary | ICD-10-CM

## 2013-10-10 DIAGNOSIS — J449 Chronic obstructive pulmonary disease, unspecified: Secondary | ICD-10-CM

## 2013-10-10 DIAGNOSIS — E870 Hyperosmolality and hypernatremia: Secondary | ICD-10-CM

## 2013-10-10 DIAGNOSIS — M629 Disorder of muscle, unspecified: Secondary | ICD-10-CM | POA: Diagnosis present

## 2013-10-10 DIAGNOSIS — I739 Peripheral vascular disease, unspecified: Secondary | ICD-10-CM

## 2013-10-10 DIAGNOSIS — K831 Obstruction of bile duct: Principal | ICD-10-CM

## 2013-10-10 DIAGNOSIS — I7092 Chronic total occlusion of artery of the extremities: Secondary | ICD-10-CM | POA: Diagnosis present

## 2013-10-10 DIAGNOSIS — R748 Abnormal levels of other serum enzymes: Secondary | ICD-10-CM

## 2013-10-10 DIAGNOSIS — M7989 Other specified soft tissue disorders: Secondary | ICD-10-CM

## 2013-10-10 DIAGNOSIS — Z515 Encounter for palliative care: Secondary | ICD-10-CM

## 2013-10-10 DIAGNOSIS — M79609 Pain in unspecified limb: Secondary | ICD-10-CM

## 2013-10-10 DIAGNOSIS — D630 Anemia in neoplastic disease: Secondary | ICD-10-CM | POA: Diagnosis present

## 2013-10-10 DIAGNOSIS — I10 Essential (primary) hypertension: Secondary | ICD-10-CM | POA: Diagnosis present

## 2013-10-10 DIAGNOSIS — R609 Edema, unspecified: Secondary | ICD-10-CM

## 2013-10-10 DIAGNOSIS — E46 Unspecified protein-calorie malnutrition: Secondary | ICD-10-CM

## 2013-10-10 DIAGNOSIS — Z66 Do not resuscitate: Secondary | ICD-10-CM | POA: Diagnosis present

## 2013-10-10 DIAGNOSIS — K311 Adult hypertrophic pyloric stenosis: Secondary | ICD-10-CM | POA: Diagnosis present

## 2013-10-10 DIAGNOSIS — I70299 Other atherosclerosis of native arteries of extremities, unspecified extremity: Secondary | ICD-10-CM | POA: Diagnosis present

## 2013-10-10 DIAGNOSIS — C799 Secondary malignant neoplasm of unspecified site: Secondary | ICD-10-CM

## 2013-10-10 DIAGNOSIS — M2420 Disorder of ligament, unspecified site: Secondary | ICD-10-CM | POA: Diagnosis present

## 2013-10-10 DIAGNOSIS — E43 Unspecified severe protein-calorie malnutrition: Secondary | ICD-10-CM

## 2013-10-10 DIAGNOSIS — I998 Other disorder of circulatory system: Secondary | ICD-10-CM

## 2013-10-10 DIAGNOSIS — K219 Gastro-esophageal reflux disease without esophagitis: Secondary | ICD-10-CM

## 2013-10-10 HISTORY — DX: Malignant neoplasm of stomach, unspecified: C16.9

## 2013-10-10 HISTORY — DX: Malignant (primary) neoplasm, unspecified: C80.1

## 2013-10-10 LAB — CBC WITH DIFFERENTIAL/PLATELET
BASOS PCT: 0 % (ref 0–1)
Basophils Absolute: 0 10*3/uL (ref 0.0–0.1)
Basophils Absolute: 0 10*3/uL (ref 0.0–0.1)
Basophils Relative: 0 % (ref 0–1)
EOS ABS: 0.1 10*3/uL (ref 0.0–0.7)
EOS PCT: 1 % (ref 0–5)
Eosinophils Absolute: 0.1 10*3/uL (ref 0.0–0.7)
Eosinophils Relative: 1 % (ref 0–5)
HCT: 32.4 % — ABNORMAL LOW (ref 39.0–52.0)
HCT: 29.3 % — ABNORMAL LOW (ref 39.0–52.0)
HEMOGLOBIN: 10.4 g/dL — AB (ref 13.0–17.0)
HEMOGLOBIN: 9.2 g/dL — AB (ref 13.0–17.0)
LYMPHS ABS: 1.5 10*3/uL (ref 0.7–4.0)
LYMPHS PCT: 12 % (ref 12–46)
LYMPHS PCT: 12 % (ref 12–46)
Lymphs Abs: 1.3 10*3/uL (ref 0.7–4.0)
MCH: 31.5 pg (ref 26.0–34.0)
MCH: 31.5 pg (ref 26.0–34.0)
MCHC: 32.1 g/dL (ref 30.0–36.0)
MCHC: 31.4 g/dL (ref 30.0–36.0)
MCV: 98.2 fL (ref 78.0–100.0)
MCV: 100.3 fL — ABNORMAL HIGH (ref 78.0–100.0)
MONO ABS: 0.5 10*3/uL (ref 0.1–1.0)
MONO ABS: 0.4 10*3/uL (ref 0.1–1.0)
MONOS PCT: 5 % (ref 3–12)
MONOS PCT: 4 % (ref 3–12)
NEUTROS ABS: 9.1 10*3/uL — AB (ref 1.7–7.7)
NEUTROS PCT: 83 % — AB (ref 43–77)
Neutro Abs: 9.9 10*3/uL — ABNORMAL HIGH (ref 1.7–7.7)
Neutrophils Relative %: 82 % — ABNORMAL HIGH (ref 43–77)
PLATELETS: 443 10*3/uL — AB (ref 150–400)
Platelets: 423 10*3/uL — ABNORMAL HIGH (ref 150–400)
RBC: 3.3 MIL/uL — ABNORMAL LOW (ref 4.22–5.81)
RBC: 2.92 MIL/uL — ABNORMAL LOW (ref 4.22–5.81)
RDW: 16.7 % — ABNORMAL HIGH (ref 11.5–15.5)
RDW: 16.9 % — ABNORMAL HIGH (ref 11.5–15.5)
WBC: 12 10*3/uL — AB (ref 4.0–10.5)
WBC: 11 10*3/uL — AB (ref 4.0–10.5)

## 2013-10-10 LAB — COMPREHENSIVE METABOLIC PANEL (CC13)
ALBUMIN: 2.1 g/dL — AB (ref 3.5–5.0)
ALT: 105 U/L — AB (ref 0–55)
AST: 94 U/L — AB (ref 5–34)
Anion Gap: 26 mEq/L — ABNORMAL HIGH (ref 3–11)
BUN: 79.6 mg/dL — ABNORMAL HIGH (ref 7.0–26.0)
CO2: >45 mEq/L — ABNORMAL HIGH (ref 22–29)
Calcium: 10 mg/dL (ref 8.4–10.4)
Chloride: 84 mEq/L — ABNORMAL LOW (ref 98–109)
Creatinine: 1.2 mg/dL (ref 0.7–1.3)
Glucose: 162 mg/dl — ABNORMAL HIGH (ref 70–140)
POTASSIUM: 2.8 meq/L — AB (ref 3.5–5.1)
Sodium: 155 mEq/L — ABNORMAL HIGH (ref 136–145)
TOTAL PROTEIN: 6.5 g/dL (ref 6.4–8.3)
Total Bilirubin: 7.01 mg/dL — ABNORMAL HIGH (ref 0.20–1.20)

## 2013-10-10 LAB — COMPREHENSIVE METABOLIC PANEL
ALBUMIN: 2.1 g/dL — AB (ref 3.5–5.2)
ALT: 96 U/L — ABNORMAL HIGH (ref 0–53)
AST: 89 U/L — ABNORMAL HIGH (ref 0–37)
Alkaline Phosphatase: 994 U/L — ABNORMAL HIGH (ref 39–117)
BUN: 82 mg/dL — ABNORMAL HIGH (ref 6–23)
CHLORIDE: 91 meq/L — AB (ref 96–112)
CO2: >45 mEq/L (ref 19–32)
CREATININE: 1.26 mg/dL (ref 0.50–1.35)
Calcium: 9.3 mg/dL (ref 8.4–10.5)
GFR calc Af Amer: 71 mL/min — ABNORMAL LOW (ref >90)
GFR calc non Af Amer: 61 mL/min — ABNORMAL LOW (ref >90)
Glucose, Bld: 120 mg/dL — ABNORMAL HIGH (ref 70–99)
Potassium: 2.4 mEq/L — CL (ref 3.7–5.3)
Sodium: 155 mEq/L — ABNORMAL HIGH (ref 137–147)
Total Bilirubin: 7.3 mg/dL — ABNORMAL HIGH (ref 0.3–1.2)
Total Protein: 6.1 g/dL (ref 6.0–8.3)

## 2013-10-10 LAB — CK: CK TOTAL: 137 U/L (ref 7–232)

## 2013-10-10 LAB — PROTIME-INR
INR: 1.14 (ref 0.00–1.49)
INR: 1.12 (ref 0.00–1.49)
PROTHROMBIN TIME: 14.4 s (ref 11.6–15.2)
PROTHROMBIN TIME: 14.2 s (ref 11.6–15.2)

## 2013-10-10 LAB — CG4 I-STAT (LACTIC ACID): LACTIC ACID, VENOUS: 1.25 mmol/L (ref 0.5–2.2)

## 2013-10-10 LAB — MAGNESIUM: Magnesium: 3.1 mg/dL — ABNORMAL HIGH (ref 1.5–2.5)

## 2013-10-10 LAB — APTT
aPTT: 29 seconds (ref 24–37)
aPTT: 31 seconds (ref 24–37)

## 2013-10-10 LAB — MAGNESIUM (CC13): Magnesium: 2.8 mg/dl — ABNORMAL HIGH (ref 1.5–2.5)

## 2013-10-10 LAB — PHOSPHORUS
PHOSPHORUS: 4.8 mg/dL — AB (ref 2.3–4.6)
Phosphorus: 4.6 mg/dL (ref 2.3–4.6)

## 2013-10-10 MED ORDER — HYDROMORPHONE HCL PF 1 MG/ML IJ SOLN
1.0000 mg | INTRAMUSCULAR | Status: DC | PRN
  Administered 2013-10-10 – 2013-10-11 (×2): 1 mg via INTRAVENOUS
  Filled 2013-10-10 (×3): qty 1

## 2013-10-10 MED ORDER — SODIUM CHLORIDE 0.9 % IV SOLN
INTRAVENOUS | Status: DC
  Administered 2013-10-10: 17:00:00 via INTRAVENOUS

## 2013-10-10 MED ORDER — OXYCODONE HCL 5 MG/5ML PO SOLN
15.0000 mg | ORAL | Status: DC | PRN
  Administered 2013-10-10 – 2013-10-11 (×3): 15 mg
  Filled 2013-10-10 (×3): qty 15

## 2013-10-10 MED ORDER — HEPARIN BOLUS VIA INFUSION
1800.0000 [IU] | Freq: Once | INTRAVENOUS | Status: AC
Start: 2013-10-10 — End: 2013-10-10
  Administered 2013-10-10: 1800 [IU] via INTRAVENOUS
  Filled 2013-10-10: qty 1800

## 2013-10-10 MED ORDER — SODIUM CHLORIDE 0.9 % IV SOLN: INTRAVENOUS | Status: AC

## 2013-10-10 MED ORDER — SODIUM CHLORIDE 0.9 % IV BOLUS (SEPSIS)
500.0000 mL | Freq: Once | INTRAVENOUS | Status: AC
  Administered 2013-10-10: 500 mL via INTRAVENOUS

## 2013-10-10 MED ORDER — POTASSIUM CHLORIDE 10 MEQ/100ML IV SOLN
10.0000 meq | Freq: Once | INTRAVENOUS | Status: AC
  Administered 2013-10-10: 10 meq via INTRAVENOUS
  Filled 2013-10-10: qty 100

## 2013-10-10 MED ORDER — ACETAMINOPHEN 650 MG RE SUPP: 650.0000 mg | Freq: Four times a day (QID) | RECTAL | Status: DC | PRN

## 2013-10-10 MED ORDER — POTASSIUM CHLORIDE 20 MEQ/15ML (10%) PO SOLN: 20.0000 meq | Freq: Two times a day (BID) | ORAL | Status: DC

## 2013-10-10 MED ORDER — ONDANSETRON HCL 4 MG/2ML IJ SOLN: 4.0000 mg | Freq: Four times a day (QID) | INTRAMUSCULAR | Status: DC | PRN

## 2013-10-10 MED ORDER — LOPERAMIDE HCL 2 MG PO CAPS: 4.0000 mg | ORAL_CAPSULE | ORAL | Status: DC | PRN

## 2013-10-10 MED ORDER — SODIUM CHLORIDE 0.9 % IV SOLN
INTRAVENOUS | Status: DC
  Administered 2013-10-10: 20:00:00 via INTRAVENOUS

## 2013-10-10 MED ORDER — HEPARIN (PORCINE) IN NACL 100-0.45 UNIT/ML-% IJ SOLN
1100.0000 [IU]/h | INTRAMUSCULAR | Status: DC
  Administered 2013-10-10: 1100 [IU]/h via INTRAVENOUS
  Filled 2013-10-10: qty 250

## 2013-10-10 MED ORDER — POTASSIUM CHLORIDE 10 MEQ/100ML IV SOLN
10.0000 meq | INTRAVENOUS | Status: AC
  Administered 2013-10-10 – 2013-10-11 (×3): 10 meq via INTRAVENOUS
  Filled 2013-10-10 (×3): qty 100

## 2013-10-10 MED ORDER — FREE WATER
200.0000 mL | Freq: Three times a day (TID) | Status: DC
  Administered 2013-10-10 – 2013-10-11 (×2): 200 mL

## 2013-10-10 MED ORDER — ACETAMINOPHEN 325 MG PO TABS: 650.0000 mg | ORAL_TABLET | Freq: Four times a day (QID) | ORAL | Status: DC | PRN

## 2013-10-10 MED ORDER — SODIUM CHLORIDE 0.9 % IV SOLN
INTRAVENOUS | Status: DC
  Administered 2013-10-11: 11:00:00 via INTRAVENOUS

## 2013-10-10 MED ORDER — ONDANSETRON HCL 4 MG PO TABS: 4.0000 mg | ORAL_TABLET | Freq: Four times a day (QID) | ORAL | Status: DC | PRN

## 2013-10-10 MED ORDER — PANTOPRAZOLE SODIUM 40 MG IV SOLR
40.0000 mg | Freq: Two times a day (BID) | INTRAVENOUS | Status: DC
  Administered 2013-10-10 – 2013-10-11 (×2): 40 mg via INTRAVENOUS
  Filled 2013-10-10 (×3): qty 40

## 2013-10-10 MED ORDER — POTASSIUM CHLORIDE 20 MEQ/15ML (10%) PO LIQD
20.0000 meq | Freq: Two times a day (BID) | ORAL | Status: DC
  Administered 2013-10-10 – 2013-10-11 (×2): 20 meq
  Filled 2013-10-10 (×3): qty 15

## 2013-10-10 NOTE — Progress Notes (Signed)
*  PRELIMINARY RESULTS* Vascular Ultrasound Bilateral lower extremity venous duplex and Left Lower Extremity Arterial Duplex has been completed. There is no obvious evidence of deep vein thrombosis involving the right lower extremity. There is rouleaux flow noted in the right saphenofemoral junction.  The left lower extremity exhibits rouleaux flow in the saphenofemoral junction, common femoral vein, and popliteal vein. The left popliteal vein exhibits hyperacute platelet aggregation in the subvalvular region. The left popliteal vein is partially compressible, this along with previously mentioned findings are suggestive of hyperacute thrombus formation.  The right lower extremity arteries are patent with triphasic flow.  The left lower extremity exhibits a 50-99% stenosis of the distal left popliteal artery, with greatly diminished flow distally. ABIs were not performed due to possible hyperacute thrombus formation.   Preliminary results discussed with Dr.Sherrill.  10/10/2013  Maudry Mayhew, RVT, RDCS, RDMS

## 2013-10-10 NOTE — Progress Notes (Signed)
   CARE MANAGEMENT ED NOTE 10/10/2013  Patient:  Randy Le, Randy Le   Account Number:  [id number]  Date Initiated:  10/10/2013  Documentation initiated by:  Livia Snellen  Subjective/Objective Assessment:   Patient presents to Ed after being seen at cancer center for complaints of increased bilateral lower leg swelling and pain     Subjective/Objective Assessment Detail:   Patient with history of stomach cancer with j tube.     Action/Plan:   Action/Plan Detail:   Anticipated DC Date:       Status Recommendation to Physician:   Result of Recommendation:    Other ED Services  Consult [facility]  Other    Choice offered to / List presented to:            Status of service:  Completed, signed off  ED Comments:   ED Comments Detail:  EDCM consulted to speak to patinet regarding home hospice. EDCM spoke to patient at bedside.  Patient reports he lives at home with his wife.  Patient does not have any medical equipment at home.  Patient reports he currently has a visiting RN from [facility] that comes out twice a week.  Patient receives all of his supplies for his jtube from [place].  Patient does have a kangaroo pump for feedings with a pole.  Patient's wife assists patient with all of his ADLs.  Patient reports he is interested in home hospice and stopping services with [facility]. EDCM explained to patient that he would have to call [facility] if he wanted to stop services.  Patient verbalized understanding. [facility] provided patient with a list of home hospice agencies.  Patient is agreeable to speak to someone regarding hospice while in the hospital.  [facility] placed sticky note in physican comment section asking for palliative consult.  Patient may not want [facility] as he lives in [place], but would be interested in speaking to someone.  No further EDCM needs at this time.

## 2013-10-10 NOTE — Progress Notes (Addendum)
OFFICE PROGRESS NOTE  Interval history:  Mr. Lukasik returns for followup of metastatic gastric cancer. He is administering daily IV fluids. He continues to have loose stools. Imodium is not effective. He denies nausea/vomiting. No fever. He reports bilateral leg edema. He complains of pain at the left lower leg for about 2 weeks. The pain worsens with ambulation. He notes intermittent tingling at the left lower leg.   Objective: Filed Vitals:   10/10/13 0843  BP: 93/59  Pulse: 109  Resp: 19   Scleral icterus. Posterior palate with thrush. Mucous membranes appear moist. Lungs clear. Regular cardiac rhythm. Abdomen nontender. Gastrostomy tube site with mild surrounding pink discoloration. 1+ edema at the right lower leg. Trace edema at the left lower leg. Distal extremities cool bilaterally beginning at the ankles extending to the toes left greater than right. Markedly diminished capillary refill at the toes. Left foot pale when elevated. Unable to detect posterior tibial, dorsalis pedis and popliteal pulses bilaterally. Bilateral femoral pulse 2+. Markedly diminished skin turgor.  Lab Results: Lab Results  Component Value Date   WBC 11.1* 10/04/2013   HGB 10.0* 10/04/2013   HCT 29.8* 10/04/2013   MCV 97.2 10/04/2013   PLT 228 10/04/2013   NEUTROABS 9.8* 10/04/2013    Chemistry:    Chemistry      Component Value Date/Time   NA 155* 10/10/2013 0820   NA 137 09/14/2013 0540   K 2.8* 10/10/2013 0820   K 3.9 09/14/2013 0540   CL 103 09/14/2013 0540   CO2 >45* 10/10/2013 0820   CO2 28 09/14/2013 0540   BUN 79.6* 10/10/2013 0820   BUN 16 09/14/2013 0540   CREATININE 1.2 10/10/2013 0820   CREATININE 0.55 09/14/2013 0540      Component Value Date/Time   CALCIUM 10.0 10/10/2013 0820   CALCIUM 8.4 09/14/2013 0540   ALKPHOS 1,038* 10/10/2013 0820   ALKPHOS 702* 09/12/2013 0530   AST 94* 10/10/2013 0820   AST 25 09/12/2013 0530   ALT 105* 10/10/2013 0820   ALT 84* 09/12/2013 0530   BILITOT  7.01* 10/10/2013 0820   BILITOT 2.8* 09/12/2013 0530       Studies/Results: No results found.  Medications: I have reviewed the patient's current medications.  Assessment/Plan: 1. Gastric cancer, stage IV, tumor involving the distal one third of the stomach, transverse colon, mesentery of the transverse colon/small bowel, retroperitoneum and pelvic "drop metastases" identified at the time of surgery 09/09/2013.  2. 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  3. Malnutrition. Maintained on jejunostomy tube feedings.  4. Ascites. Likely malignant. Cytology not submitted on 09/09/2013.  5. COPD.  6. Elevated liver enzymes/bilirubin. Initially felt to be related to TNA administered while hospitalized December 2014. Labs initially improved. Bilirubin and transaminases with progressive elevation. Biliary obstruction noted on an abdominal ultrasound 10/10/2013 to 7. Hypokalemia. Improved. He will continue replacement via the J-tube and intravenous.  8. 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 9. Left leg pain, pallor, coolness, diminished pulses bilaterally.   Dispositon-Mr. Mcqueary continues to have evidence of dehydration despite the addition of intravenous hydration. His bilirubin is further elevated. He is experiencing left leg pain with findings of pallor, coolness and diminished pulses.  We are referring him for an abdominal ultrasound to evaluate for biliary obstruction. We are also referring him for venous and arterial duplex studies of the legs.  Ned Card ANP/GNP-BC    Addendum-he appears to have bile duct obstruction likely secondary to cancer. The recent EGD showed a gastric outlet obstruction which could not be passed. He will require placement of a biliary drain by radiology.  Bilateral lower extremity venous  duplex and left lower extremity arterial duplex showed no evidence of a DVT involving the right lower extremity. There were findings suggestive of hyperacute thrombus formation involving the left leg. There was a 50-99% stenosis of the distal left popliteal artery with greatly diminished flow distally.   Dr. Benay Spice had a long discussion with Mr. Motton and his wife regarding the study findings from today. Mr. Rakestraw understands that he has an incurable cancer and may never be able to receive chemotherapy. He understands there are multiple concurrent issues likely all related to the cancer.  Dr. Benay Spice discussed options to include hospitalization for  vascular and interventional radiology evaluation versus focusing on supportive/comfort measures with a hospice referral.   Mr. Hanover main concern is related to relief of the left leg pain so he can be ambulatory. He would like to proceed with hospitalization to expedite vascular evaluation.   At present there are no beds available at the hospital. Dr. Benay Spice has contacted the emergency room physician at [facility] regarding Mr. Conkey's case. We will transfer him to the emergency department so evaluation can be initiated while he is awaiting a bed.  Dr. Benay Spice discussed CODE STATUS/ACLS with Mr. Chaloux. He indicates he would like to be placed on NO CODE BLUE status.    Patient seen with Dr. Benay Spice.   Approximately 50 minutes were spent face-to-face at today's visit with the majority of the time involved in counseling/coordination of care.  Ned Card, ANP/GNP-BC  This was a shared visit with Ned Card. Mr. Cruzan was interviewed and examined.   He has metastatic gastric cancer and multiple comorbid conditions. He is symptomatic with ischemia involving the left lower extremity. He will be admitted for vascular surgery consultation. He also appears to have biliary obstruction, likely secondary to tumor. He will also be  evaluated for placement of a palliative biliary drain.  His prognosis is very poor. The goal is comfort care. We will try to get him home with hospice care if his symptoms can be palliated. He is not a candidate for systemic chemotherapy. I discussed CPR and ACLS issues with Mr. Proia. He will be placed on a no CODE BLUE status. He agrees to home hospice care at discharge.  I discussed the case with the admitting hospital and emergency room physicians. Julieanne Manson, M.D.

## 2013-10-10 NOTE — ED Provider Notes (Signed)
CSN: [id number]     Arrival date & time 10/10/13  1422 History   First MD Initiated Contact with Patient 10/10/13 1502     Chief Complaint  Patient presents with  . Leg Pain  . DVT   (Consider location/radiation/quality/duration/timing/severity/associated sxs/prior Treatment) Patient is a 58 y.o. male presenting with leg pain. The history is provided by the patient.  Leg Pain Associated symptoms: back pain   Associated symptoms: no fever    patient was sent in by his oncologist. He has known stage IV gastric cancer. He had increased pain. An ultrasound was done due to pain and showed possible acute DVT and popliteal stricture. Also has chronic abdominal pain. He has had more jaundiced. Ultrasound shows intrabiliary dilatation. He has a G. tube for draining of stomach and a J-tube for feeding. Discussed with Dr. Benay Spice, patient will be admitted and likely placed on hospice in the next day or 2. May need percutaneous drain of the biliary system. Vascular will be consult for the leg pain. He has no bleeding. He has had dehydration and hypokalemia as an outpatient.  Past Medical History  Diagnosis Date  . Hypertension   . Reflux   . Cancer   . Stomach cancer    Past Surgical History  Procedure Laterality Date  . Hernia repair    . Laparotomy  04/08/2011    Procedure: EXPLORATORY LAPAROTOMY;  Surgeon: Jamesetta So;  Location: [facility]S;  Service: General;  Laterality: N/A;  Exploratory Laparotomy with Partial Small Bowel Resection  . Small intestiine surgery    . External ear surgery    . Esophagogastroduodenoscopy N/A 08/23/2013    Procedure: ESOPHAGOGASTRODUODENOSCOPY (EGD);  Surgeon: Rogene Houston, MD;  Location: [facility] ENDO SUITE;  Service: Endoscopy;  Laterality: N/A;  . Eus N/A 08/29/2013    Procedure: ESOPHAGEAL ENDOSCOPIC ULTRASOUND (EUS) RADIAL;  Surgeon: Milus Banister, MD;  Location: [facility] ENDOSCOPY;  Service: Endoscopy;  Laterality: N/A;  plus/minus linear  . Jejunostomy N/A  09/09/2013    Procedure: Shanon Rosser;  Surgeon: Shann Medal, MD;  Location: [facility] ORS;  Service: General;  Laterality: N/A;   History reviewed. No pertinent family history. History  Substance Use Topics  . Smoking status: Current Every Day Smoker -- 1.00 packs/day    Types: Cigarettes  . Smokeless tobacco: Not on file  . Alcohol Use: 0.0 oz/week    3-4 Cans of beer per week     Comment: daily    Review of Systems  Constitutional: Negative for fever.  HENT: Negative for facial swelling.   Respiratory: Negative for shortness of breath.   Cardiovascular: Negative for chest pain.  Gastrointestinal: Positive for abdominal pain.  Genitourinary: Negative for flank pain.  Musculoskeletal: Positive for back pain.  Skin: Positive for color change.  Neurological: Positive for weakness.    Allergies  Review of patient's allergies indicates no known allergies.  Home Medications   No current outpatient prescriptions on file. BP 97/66  Pulse 108  Temp(Src) 97.8 F (36.6 C) (Oral)  Resp 20  Ht 5\' 8"  (1.727 m)  Wt 108 lb (48.988 kg)  BMI 16.42 kg/m2  SpO2 94% Physical Exam  Constitutional: He appears well-developed.  Cachectic  HENT:  Head: Normocephalic.  Eyes: Pupils are equal, round, and reactive to light. Scleral icterus is present.  Cardiovascular:  Mild tachycardia  Pulmonary/Chest: Effort normal.  Mild tachypnea  Abdominal: There is tenderness.  G-tube and J-tube.  Musculoskeletal: He exhibits edema.  Edema right lower extremity. Markedly  delayed capillary refill on left lower leg and foot. There is pain with palpation of the calf. There is less edema on the left than the right. No palpable dorsalis pedis or posterior tibial pulse.  Neurological: He is alert.  Skin: Skin is warm.    ED Course  Procedures (including critical care time) Labs Review Labs Reviewed  CBC WITH DIFFERENTIAL - Abnormal; Notable for the following:    WBC 12.0 (*)    RBC 3.30 (*)     Hemoglobin 10.4 (*)    HCT 32.4 (*)    RDW 16.7 (*)    Platelets 443 (*)    Neutrophils Relative % 82 (*)    Neutro Abs 9.9 (*)    All other components within normal limits  COMPREHENSIVE METABOLIC PANEL - Abnormal; Notable for the following:    Sodium 155 (*)    Potassium 2.4 (*)    Chloride 91 (*)    CO2 >45 (*)    Glucose, Bld 120 (*)    BUN 82 (*)    Albumin 2.1 (*)    AST 89 (*)    ALT 96 (*)    Alkaline Phosphatase 994 (*)    Total Bilirubin 7.3 (*)    GFR calc non Af Amer 61 (*)    GFR calc Af Amer 71 (*)    All other components within normal limits  MAGNESIUM - Abnormal; Notable for the following:    Magnesium 3.1 (*)    All other components within normal limits  PHOSPHORUS - Abnormal; Notable for the following:    Phosphorus 4.8 (*)    All other components within normal limits  CBC WITH DIFFERENTIAL - Abnormal; Notable for the following:    WBC 11.0 (*)    RBC 2.92 (*)    Hemoglobin 9.2 (*)    HCT 29.3 (*)    MCV 100.3 (*)    RDW 16.9 (*)    Platelets 423 (*)    Neutrophils Relative % 83 (*)    Neutro Abs 9.1 (*)    All other components within normal limits  PROTIME-INR  APTT  CK  APTT  PROTIME-INR  TSH  COMPREHENSIVE METABOLIC PANEL  HEPARIN LEVEL (UNFRACTIONATED)  CBC  CG4 I-STAT (LACTIC ACID)   Imaging Review US Abdomen Limited  10/10/2013   CLINICAL DATA:  Gastric cancer.  Elevated bilirubin.  EXAM: US ABDOMEN LIMITED - RIGHT UPPER QUADRANT  COMPARISON:  CT abdomen and pelvis 08/13/2013.  FINDINGS: Gallbladder:  The gallbladder is collapsed.  Common bile duct:  Diameter: The common bile duct is markedly enlarged, measuring up to 14 mm.  Liver:  Moderate intrahepatic biliary dilation is evident. No discrete hepatic mass is evident.  Note is made of moderate right-sided hydronephrosis. Moderate abdominal ascites are present.  IMPRESSION: 1. Marked intrahepatic biliary dilation. This is concerning for an obstructing mass. Recommend CT of the abdomen with  contrast for further evaluation. 2. Moderate right-sided hydronephrosis. 3. Moderate abdominal ascites.   Electronically Signed   By: Lawrence Santiago M.D.   On: 10/10/2013 12:20    EKG Interpretation    Date/Time:    Ventricular Rate:    PR Interval:    QRS Duration:   QT Interval:    QTC Calculation:   R Axis:     Text Interpretation:              MDM   1. Gastric cancer   2. Ischemia of foot   3. Biliary obstruction due  to malignant neoplasm   4. Hypokalemia   5. Ascites   6. Biliary obstruction   7. COPD (chronic obstructive pulmonary disease)    Patient with gastric cancer and foot ischemia. Also has biliary obstruction. Discussed with oncologist. Patient will likely be made hospice, will need GI and vascular consults.    will not to worry about pulmonary embolisms with blood clots.  Jasper Riling. Alvino Chapel, MD 10/11/13 7035

## 2013-10-10 NOTE — Progress Notes (Signed)
CRITICAL VALUE ALERT  Critical value received:  Potassium 2.4, CO2 > 45  Date of notification:  10/10/13  Time of notification:  2136  Critical value read back:yes  Nurse who received alert:  Noreene Larsson RN  MD notified (1st page):  Daphene Calamity NP  Time of first page:  2146  Responding MD: Daphene Calamity NP  Time MD responded:  2138

## 2013-10-10 NOTE — ED Notes (Addendum)
Pt a+ox4, presents with wife following appt at [facility].  Pt reports recent dx stomach CA, not yet started on chemo.  Pt c/o bilat leg pain and swelling x2 weeks at appt this AM.  Labs and U/S done at appt today U/S shows DVTs per wife and labs "are all wrong".  1+BLE edema noted, +pain on palpation.  Pt reports 2/10 pain at this time while at rest to BLE.  Pt denies cp/palpitations or SOB.  Skin jaundiced, w/d.  Pt with both G and J tube, also PICC line to RUE.  Pt denies dizziness, weakness.  Pt reports was sent from [facility] "because there weren't any beds in the hospital".

## 2013-10-10 NOTE — Consult Note (Signed)
[facility] [place]  Referred by:  [facility]  Reason for referral: left leg ischemia  History of Present Illness  Randy Le is a 58 y.o. (1956/05/23) male with stage IV gastric cancer who presents with chief complaint: left leg pain.  Pt has had L>>R leg pain for the last two weeks, worse with ambulation.  He now has pain a rest in the L >> R.  He denies any anesthesia or paraesthesia.  He denies weakness but prefers not to move the left leg due to pain.   Pain is aching and sharp in character, worsen with movement and any manipulation, with intensity 6-10/10.  The patient has unresectable gastric cancer with PEG and J-tube for drainage.  The patient and family notes prior bilateral leg swelling, now R > L.  Atherosclerotic risk factors include: HTN, active smoking.  The patient denies any ulcers or gangrene.  Past Medical History  Diagnosis Date  . Hypertension   . Reflux   . Cancer   . Stomach cancer     Past Surgical History  Procedure Laterality Date  . Hernia repair    . Laparotomy  04/08/2011    Procedure: EXPLORATORY LAPAROTOMY;  Surgeon: Jamesetta So;  Location: [facility];  Service: General;  Laterality: N/A;  Exploratory Laparotomy with Partial Small Bowel Resection  . Small intestiine surgery    . External ear surgery    . Esophagogastroduodenoscopy N/A 08/23/2013    Procedure: ESOPHAGOGASTRODUODENOSCOPY (EGD);  Surgeon: Rogene Houston, MD;  Location[facility];  Service: Endoscopy;  Laterality: N/A;  . Eus N/A 08/29/2013    Procedure: ESOPHAGEAL ENDOSCOPIC ULTRASOUND (EUS) RADIAL;  Surgeon: Milus Banister, MD;  Location: [facility];  Service: Endoscopy;  Laterality: N/A;  plus/minus linear  . Jejunostomy N/A 09/09/2013    Procedure: Shanon Rosser;  Surgeon: Shann Medal, MD;  Location: [facility];  Service: General;  Laterality: N/A;    History   Social History  . Marital Status: Married    Spouse Name: N/A    Number of Children: N/A  . Years  of Education: N/A   Occupational History  . Not on file.   Social History Main Topics  . Smoking status: Current Every Day Smoker -- 1.00 packs/day    Types: Cigarettes  . Smokeless tobacco: Not on file  . Alcohol Use: 0.0 oz/week    3-4 Cans of beer per week     Comment: daily  . Drug Use: No  . Sexual Activity: Not on file   Other Topics Concern  . Not on file   Social History Narrative  . No narrative on file   Family History: the patient unable to recall any familial medical history  No current facility-administered medications on file prior to encounter.   Current Outpatient Prescriptions on File Prior to Encounter  Medication Sig Dispense Refill  . heparin lock flush 100 UNIT/ML SOLN injection 2.5 mLs (250 Units total) by Intracatheter route 2 (two) times a week.  24 Syringe  1  . loperamide (IMODIUM) 2 MG capsule Take 2 capsules (4 mg total) by mouth as needed for diarrhea or loose stools (Take four times daily as needed to control diarrhea (may use liquid form in J-tube if not able to swallow)).  30 capsule  0  . Nutritional Supplements (FEEDING SUPPLEMENT, VITAL AF 1.2 CAL,) LIQD Increase Vital AF 1.2 to 120 ml via jejunostomy for 16 hours daily.  Flush feeding tube with 180 ml  free water 5 times daily.  1920 mL    . oxyCODONE (ROXICODONE) 5 MG/5ML solution Place 15 mLs (15 mg total) into feeding tube every 4 (four) hours as needed for moderate pain or severe pain.  500 mL  0  . potassium chloride 20 MEQ/15ML (10%) SOLN Place 15 mLs (20 mEq total) into feeding tube 2 (two) times daily. Take 15 ml three times daily today via J-tube, then twice daily  900 mL  1  . Sodium Chloride Flush (NORMAL SALINE FLUSH) 0.9 % SOLN Flush each lumen of PICC with 66mL twice weekly.  24 Syringe  1    No Known Allergies   REVIEW OF SYSTEMS:  (Positives checked otherwise negative)  CARDIOVASCULAR:  []  chest pain, []  chest pressure, []  palpitations, []  shortness of breath when laying flat,  []  shortness of breath with exertion,  [x]  pain in feet when walking, [x]  pain in feet when laying flat, []  history of blood clot in veins (DVT), []  history of phlebitis, [x]  swelling in legs, []  varicose veins  PULMONARY:  []  productive cough, []  asthma, []  wheezing  NEUROLOGIC:  []  weakness in arms or legs, []  numbness in arms or legs, []  difficulty speaking or slurred speech, []  temporary loss of vision in one eye, []  dizziness  HEMATOLOGIC:  []  bleeding problems, []  problems with blood clotting too easily  MUSCULOSKEL:  []  joint pain, []  joint swelling  GASTROINTEST:  []  vomiting blood, []  blood in stool, [x]  GOO     GENITOURINARY:  []  burning with urination, []  blood in urine  PSYCHIATRIC:  []  history of major depression  INTEGUMENTARY:  []  rashes, []  ulcers  CONSTITUTIONAL:  []  fever, []  chills  For VQI Use Only  PRE-ADM LIVING: Home  AMB STATUS: Ambulatory with Assistance  CAD Sx: None  PRIOR CHF: None  STRESS TEST: [x]  No, [ ]  Normal, [ ]  + ischemia, [ ]  + MI, [ ]  Both  Physical Examination  Filed Vitals:   10/10/13 1428  BP: 97/66  Pulse: 111  TempSrc: Oral  Resp: 25  SpO2: 95%    There is no weight on file to calculate BMI.  General: A&O x 3, WD, cachectic  Head: [place]/AT, Temporalis wasting,   Ear/Nose/Throat: Hearing grossly intact, nares w/o erythema or drainage, oropharynx w/o Erythema/Exudate, Mallampati score: 3,   Eyes: PERRLA, EOMI  Neck: Supple, no nuchal rigidity, no palpable LAD  Pulmonary: Sym exp, good air movt, CTAB, no rales, rhonchi, & wheezing  Cardiac: RRR, Nl S1, S2, no Murmurs, rubs or gallops  Vascular: Vessel Right Left  Radial Palpable Palpable  Brachial Palpable Palpable  Carotid Palpable, without bruit Palpable, without bruit  Aorta Not palpable N/A  Femoral Palpable Palpable  Popliteal Faintly palpable Not palpable  PT Not Palpable Not Palpable  DP Palpable Not Palpable   Gastrointestinal: firm palpable RUQ and LUQ  masses, PEG and J-tube in place, distended with small fluid wave, -G/R, + HSM, - CVAT B  Musculoskeletal: M/S 5/5 BUE throughout , limited BLE testing due pain, able to DF/PF both feet,Extremities without ischemic changes except  Cyanotic hue in both great toes, mottling left foot  Neurologic: CN 2-12 intact , Pain and light touch intact in extremities including L foot, Motor exam as listed above  Psychiatric: Judgment intact, Mood & affect appropriate for pt's clinical situation  Dermatologic: See M/S exam for extremity exam, no rashes otherwise noted  Lymph : No Cervical, Axillary, or Inguinal lymphadenopathy   Non-Invasive Vascular Imaging  BLE Venous Duplex (10/10/2013):  No DVT in RLE  DVT in process of forming in L popliteal vein  Read and finalized by myself  BLE Arterial Duplex (10/10/2013)  RLE: triphasic flow throughout  LLE: triphasic flow down to popliteal artery, calcific plaque in popliteal artery, dampened monophasic flow in posterior tibial artery   Radiology: US Abdomen Limited  10/10/2013   CLINICAL DATA:  Gastric cancer.  Elevated bilirubin.  EXAM: US ABDOMEN LIMITED - RIGHT UPPER QUADRANT  COMPARISON:  CT abdomen and pelvis 08/13/2013.  FINDINGS: Gallbladder:  The gallbladder is collapsed.  Common bile duct:  Diameter: The common bile duct is markedly enlarged, measuring up to 14 mm.  Liver:  Moderate intrahepatic biliary dilation is evident. No discrete hepatic mass is evident.  Note is made of moderate right-sided hydronephrosis. Moderate abdominal ascites are present.  IMPRESSION: 1. Marked intrahepatic biliary dilation. This is concerning for an obstructing mass. Recommend CT of the abdomen with contrast for further evaluation. 2. Moderate right-sided hydronephrosis. 3. Moderate abdominal ascites.   Electronically Signed   By: Lawrence Santiago M.D.   On: 10/10/2013 12:20   Laboratory: CBC:    Component Value Date/Time   WBC 12.0* 10/10/2013 1535   WBC 11.1*  10/04/2013 0914   RBC 3.30* 10/10/2013 1535   RBC 3.07* 10/04/2013 0914   HGB 10.4* 10/10/2013 1535   HGB 10.0* 10/04/2013 0914   HCT 32.4* 10/10/2013 1535   HCT 29.8* 10/04/2013 0914   PLT 443* 10/10/2013 1535   PLT 228 10/04/2013 0914   MCV 98.2 10/10/2013 1535   MCV 97.2 10/04/2013 0914   MCH 31.5 10/10/2013 1535   MCH 32.6 10/04/2013 0914   MCHC 32.1 10/10/2013 1535   MCHC 33.5 10/04/2013 0914   RDW 16.7* 10/10/2013 1535   RDW 15.9* 10/04/2013 0914   LYMPHSABS 1.5 10/10/2013 1535   LYMPHSABS 0.8* 10/04/2013 0914   MONOABS 0.5 10/10/2013 1535   MONOABS 0.5 10/04/2013 0914   EOSABS 0.1 10/10/2013 1535   EOSABS 0.0 10/04/2013 0914   BASOSABS 0.0 10/10/2013 1535   BASOSABS 0.0 10/04/2013 0914    BMP:    Component Value Date/Time   NA 155* 10/10/2013 0820   NA 137 09/14/2013 0540   K 2.8* 10/10/2013 0820   K 3.9 09/14/2013 0540   CL 103 09/14/2013 0540   CO2 >45* 10/10/2013 0820   CO2 28 09/14/2013 0540   GLUCOSE 162* 10/10/2013 0820   GLUCOSE 97 09/14/2013 0540   BUN 79.6* 10/10/2013 0820   BUN 16 09/14/2013 0540   CREATININE 1.2 10/10/2013 0820   CREATININE 0.55 09/14/2013 0540   CALCIUM 10.0 10/10/2013 0820   CALCIUM 8.4 09/14/2013 0540   GFRNONAA >90 09/14/2013 0540   GFRAA >90 09/14/2013 0540    Coagulation: Lab Results  Component Value Date   INR 1.14 10/10/2013   INR 1.10 08/23/2013   No results found for this basename: PTT    Hepatic Function Panel     Component Value Date/Time   PROT 6.5 10/10/2013 0820   PROT 4.6* 09/12/2013 0530   ALBUMIN 2.1* 10/10/2013 0820   ALBUMIN 1.4* 09/12/2013 0530   AST 94* 10/10/2013 0820   AST 25 09/12/2013 0530   ALT 105* 10/10/2013 0820   ALT 84* 09/12/2013 0530   ALKPHOS 1,038* 10/10/2013 0820   ALKPHOS 702* 09/12/2013 0530   BILITOT 7.01* 10/10/2013 0820   BILITOT 2.8* 09/12/2013 0530   BILIDIR 3.9* 09/10/2013 0500   IBILI 0.6 09/10/2013 0500   Lab  Results  Component Value Date   INR 1.14 10/10/2013   INR 1.10 08/23/2013   CK Total:  [place] is a 58 y.o. male who presents with: Stage IV Gastric cancer, hypernatremia, hypokalemia, biliary obstruction likely due to liver metastasis, critical limb ischemia LLE PAD, acute L popliteal DVT   I doubt this is acute thrombosis due to thrombophilia from his Stage IV gastric cancer.  I suspect this is chronic PAD +/- thrombophilia from his Stage IV gastric cancer.  Regardless, he does NOT have a threatened limb as he has motor and sensation intact with dopplerable flow in his left PT. He will need an Aortogram, left leg runoff, possible intervention to delineate the extent of his left leg PAD and possibly intervene. He is NOT a surgical candidate given the multitude of metabolic abnormalities occuring.  Additionally his low albumin reflects his poor protein synthesis status, making it difficult to heal any surgical intervention.  Additionally, the patient is scheduled to start Chemotherapy which would further worsening his healing potential.  Delaying his chemotherapy for surgery is not realistic in this patient.   Overnight, I would recommend Heparin drip due to its rapid reversibility.  After recovery from his angiogram, he would be a candidate for Lovenox. I discussed with the patient the nature of angiographic procedures, especially the limited patencies of any endovascular intervention.  The patient is aware of that the risks of an angiographic procedure include but are not limited to: bleeding, infection, access site complications, renal failure, embolization, rupture of vessel, dissection, possible need for emergent surgical intervention, possible need for surgical procedures to treat the patient's pathology, anaphylactic reaction to contrast, and stroke and death.   The patient is aware of the risks and agrees to proceed.  This is scheduled to occur at [facility] tomorrow. Thank you for allowing [place] to participate in this patient's  care.  Adele Barthel, MD [facility] [place] Office: [phone] Pager: [phone]  10/10/2013, 5:16 PM

## 2013-10-10 NOTE — Progress Notes (Signed)
Patients wife wanted all treatments stopped through home health until further notice.  Ok per Lattie Haw NP.  Prescription faxed to [facility]. [phone].

## 2013-10-10 NOTE — Progress Notes (Signed)
Patient Randy Le      DOB: 1956-09-11      HGD:[id number]   New consult noted for consideration of Hospice care at home .  Will collaborate with Dr. Benay Spice in the am and see as quickly as we can.  [name], MD MBA The Palliative Medicine Team at [facility] Phone: [phone] Pager: [phone]

## 2013-10-10 NOTE — H&P (Signed)
[facility] History and Physical  MOODY ISAAC [id number] DOB: 05/31/1956 DOA: 10/10/2013  Referring physician: ER physician PCP: Purvis Kilts, MD   Chief Complaint: leg pain  HPI:  58 yo M with past medical history of HTN, COPD, tobacco abuse, recent diagnosis of gastric cancer (in December of 2014), prolonged hospitalization for gastric outlet obstruction due to gastric cancer and status post J tube for nutrition and G tube for draining. Pt presented to [facility] ED 10/10/2013 with leg pain, abdominal pain and back pain. He has ongoing abdominal pain from gastric cancer but this time he presented with more jaundice. His abdominal pain is across mid abdomen and 10/10 in intensity, not relieved with PO meds at home (per tube). No chest pain, no palpitations and no shortness of breath. No blood in stool or urine. No lightheadedness or LOC. In ED, his vitals are as follows: BP was 93/59 which improved with IV fluids to 119/70, HR ws 103-111, Tmax was 97.3 F and oxygen saturation was 95% on 2 L nasal canula. His blood work was significant for sodium of 155, potassium of 2.8, CO2 of 45 and BUN of 80, normal creatinine. CBC revealed WBC count of 12, hemoglobin of 10.4 and platelets of 443. Liver enzymes were ALP 1038, AST 94, ALT 105 and Bilirubin 7.01 which is significant elevation since 09/12/2014. Abdominal US revealed marked intrahepatic biliary dilation concerning for an obstructing mass. In addition, his distal pulses were not palpable and his legs were cold to touch for which reason vascula surgery was consulted.  Assessment and Plan:  Principal Problem:   Biliary obstruction - likely from advanced gastric cancer - Abdominal US revealed marked intrahepatic biliary dilation concerning for an obstructing mass - order placed for IR for biliary drainage if possible - LFT's markedly elevated since 09/12/2013; ALP 1038, AST 94, ALT 105 and Bilirubin 7.01  Active Problems:   Gastric  cancer - pt wants consult with PCT for hospice    Limb ischemia  - vascular surgery consulted - recommended to start heparin drip it is easily reversible in case pt decides on having an intervention   GERD (gastroesophageal reflux disease) - may continue protonix   Hypernatremia secondary to dehydration - will start free water and NS   COPD (chronic obstructive pulmonary disease) - stable   Protein-calorie malnutrition, severe - has J tube; malnutrition due to advanced carcinoma   Hypokalemia - repleted in ED - follow up BMP in am   Anemia of chronic disease - secondary to gastric cancer - hemoglobin 10.4 on admission - no indications for transfusion  Radiological Exams on Admission: US Abdomen Limited 10/10/2013   IMPRESSION: 1. Marked intrahepatic biliary dilation. This is concerning for an obstructing mass. Recommend CT of the abdomen with contrast for further evaluation. 2. Moderate right-sided hydronephrosis. 3. Moderate abdominal ascites.     Code Status: Full Family Communication: Pt at bedside Disposition Plan: Admit for further evaluation  Leisa Lenz, MD  [facility] Pager [phone]  Review of Systems:  Constitutional: Negative for fever, chills and malaise/fatigue. Negative for diaphoresis.  HENT: Negative for hearing loss, ear pain, nosebleeds, congestion, sore throat, neck pain, tinnitus and ear discharge.   Eyes: Negative for blurred vision, double vision, photophobia, pain, discharge and redness.  Respiratory: Negative for cough, hemoptysis, sputum production, shortness of breath, wheezing and stridor.   Cardiovascular: Negative for chest pain, palpitations, orthopnea, claudication and leg swelling.  Gastrointestinal: positive for abdominal pain, negative for heartburn, constipation, blood in  stool and melena.  Genitourinary: Negative for dysuria, urgency, frequency, hematuria and flank pain.  Musculoskeletal: Negative for myalgias, back pain, joint pain  and falls. Positive for leg pain Skin: Negative for itching and rash.  Neurological: Negative for dizziness and weakness. Negative for tingling, tremors, sensory change, speech change, focal weakness, loss of consciousness and headaches.  Endo/Heme/Allergies: Negative for environmental allergies and polydipsia. Does not bruise/bleed easily.  Psychiatric/Behavioral: Negative for suicidal ideas. The patient is not nervous/anxious.      Past Medical History  Diagnosis Date  . Hypertension   . Reflux   . Cancer   . Stomach cancer    Past Surgical History  Procedure Laterality Date  . Hernia repair    . Laparotomy  04/08/2011    Procedure: EXPLORATORY LAPAROTOMY;  Surgeon: Jamesetta So;  Location: [facility];  Service: General;  Laterality: N/A;  Exploratory Laparotomy with Partial Small Bowel Resection  . Small intestiine surgery    . External ear surgery    . Esophagogastroduodenoscopy N/A 08/23/2013    Procedure: ESOPHAGOGASTRODUODENOSCOPY (EGD);  Surgeon: Rogene Houston, MD;  [facility];  Service: Endoscopy;  Laterality: N/A;  . Eus N/A 08/29/2013    Procedure: ESOPHAGEAL ENDOSCOPIC ULTRASOUND (EUS) RADIAL;  Surgeon: Milus Banister, MD;  Location[facility];  Service: Endoscopy;  Laterality: N/A;  plus/minus linear  . Jejunostomy N/A 09/09/2013    Procedure: Shanon Rosser;  Surgeon: Shann Medal, MD;  Location: [facility];  Service: General;  Laterality: N/A;   Social History:  reports that he has been smoking Cigarettes.  He has been smoking about 1.00 pack per day. He does not have any smokeless tobacco history on file. He reports that he drinks alcohol. He reports that he does not use illicit drugs.  No Known Allergies  Family History: Family medical history significant for HTN, HLD   Prior to Admission medications   Medication Sig Start Date End Date Taking? Authorizing Provider  heparin lock flush 100 UNIT/ML SOLN injection 2.5 mLs (250 Units total) by Intracatheter  route 2 (two) times a week. 09/26/13  Yes Ladell Pier, MD  loperamide (IMODIUM) 2 MG capsule Take 2 capsules (4 mg total) by mouth as needed for diarrhea or loose stools (Take four times daily as needed to control diarrhea (may use liquid form in J-tube if not able to swallow)). 10/02/13  Yes Owens Shark, NP  Nutritional Supplements (FEEDING SUPPLEMENT, VITAL AF 1.2 CAL,) LIQD Increase Vital AF 1.2 to 120 ml via jejunostomy for 16 hours daily.  Flush feeding tube with 180 ml free water 5 times daily. 10/02/13  Yes Ladell Pier, MD  oxyCODONE (ROXICODONE) 5 MG/5ML solution Place 15 mLs (15 mg total) into feeding tube every 4 (four) hours as needed for moderate pain or severe pain. 09/16/13  Yes Robbie Lis, MD  potassium chloride 20 MEQ/15ML (10%) SOLN Place 15 mLs (20 mEq total) into feeding tube 2 (two) times daily. Take 15 ml three times daily today via J-tube, then twice daily 10/02/13  Yes Ladell Pier, MD  Sodium Chloride Flush (NORMAL SALINE FLUSH) 0.9 % SOLN Flush each lumen of PICC with 27mL twice weekly. 09/26/13  Yes Ladell Pier, MD   Physical Exam: Filed Vitals:   10/10/13 1428 10/10/13 1847  BP: 97/66 119/70  Pulse: 111 103  Temp:  97.3 F (36.3 C)  TempSrc: Oral Axillary  Resp: 25 20  Height:  5\' 8"  (1.727 m)  Weight:  48.988 kg (108 lb)  SpO2: 95% 100%    Physical Exam  Constitutional: Appears malnourished, weak, no acute distress  HENT: Normocephalic. No tonsillar erythema or exudates Eyes: Conjunctivae and EOM are normal. PERRLA, no scleral icterus.  Neck: Normal ROM. Neck supple. No JVD. No tracheal deviation. No thyromegaly.  CVS: RRR, S1/S2 +, no murmurs, no gallops, no carotid bruit.  Pulmonary: Effort and breath sounds normal, no stridor, rhonchi, wheezes, rales.  Abdominal: Soft. BS +,  no distension, tenderness, J tube in place Musculoskeletal: Normal range of motion. Distal pulses not palpable Lymphadenopathy: No lymphadenopathy noted, cervical,  inguinal. Neuro: Alert. Normal reflexes, muscle tone coordination. No cranial nerve deficit. Skin: Skin is warm and dry. No rash noted. Not diaphoretic. No erythema. No pallor.  Psychiatric: Normal mood and affect. Behavior, judgment, thought content normal.   Labs on Admission:  Basic Metabolic Panel:  Recent Labs Lab 10/04/13 0914 10/04/13 0914 10/10/13 0820 10/10/13 0820  NA 151*  --  155*  --   K 2.1*  --  2.8*  --   CO2 44*  --  >45*  --   GLUCOSE 139  --  162*  --   BUN 33.1*  --  79.6*  --   CREATININE 0.7  --  1.2  --   CALCIUM 9.1  --  10.0  --   MG  --   --   --  2.8*  PHOS  --  3.0  --  4.6   Liver Function Tests:  Recent Labs Lab 10/04/13 0914 10/10/13 0820  AST 82* 94*  ALT 114* 105*  ALKPHOS 446* 1,038*  BILITOT 3.94* 7.01*  PROT 6.0* 6.5  ALBUMIN 2.2* 2.1*   No results found for this basename: LIPASE, AMYLASE,  in the last 168 hours No results found for this basename: AMMONIA,  in the last 168 hours CBC:  Recent Labs Lab 10/04/13 0914 10/10/13 1535  WBC 11.1* 12.0*  NEUTROABS 9.8* 9.9*  HGB 10.0* 10.4*  HCT 29.8* 32.4*  MCV 97.2 98.2  PLT 228 443*   Cardiac Enzymes:  Recent Labs Lab 10/10/13 1535  CKTOTAL 137   BNP: No components found with this basename: POCBNP,  CBG: No results found for this basename: GLUCAP,  in the last 168 hours  If 7PM-7AM, please contact night-coverage [email or web address] Password [facility] 10/10/2013, 7:24 PM

## 2013-10-10 NOTE — Telephone Encounter (Signed)
gv an printed appt sched and avs for pt for Jan....pt sched for both dopplers and [place] ....advised pt to come back today after both appts

## 2013-10-10 NOTE — Progress Notes (Signed)
ANTICOAGULATION CONSULT NOTE - Initial Consult  Pharmacy Consult for IV heparin Indication: L popliteal DVT  No Known Allergies  Patient Measurements: Height: 5\' 8"  (172.7 cm) Weight: 108 lb (48.988 kg) IBW/kg (Calculated) : 68.4   Vital Signs: Temp: 97.3 F (36.3 C) (01/22 1847) Temp src: Axillary (01/22 1847) BP: 119/70 mmHg (01/22 1847) Pulse Rate: 103 (01/22 1847)  Labs:  Recent Labs  10/10/13 0820 10/10/13 1535  HGB  --  10.4*  HCT  --  32.4*  PLT  --  443*  APTT  --  29  LABPROT  --  14.4  INR  --  1.14  CREATININE 1.2  --   CKTOTAL  --  137    Estimated Creatinine Clearance: 46.5 ml/min (by C-G formula based on Cr of 1.2).   Medical History: Past Medical History  Diagnosis Date  . Hypertension   . Reflux   . Cancer   . Stomach cancer     Medications:  Scheduled:  . potassium chloride  10 mEq Intravenous Q1 Hr x 3   Infusions:  . sodium chloride      Assessment: 58 yo male presented with stage 4 gastric cancer, hypernatremia, hypokalemia, bili obstruction due to liver mets, critical limb ischemia LLE PAD, acute L popliteal DVT to start IV heparin for the time being as patient is scheduled for angiographic procedures tomorrow at [facility]. And note plan per Md notes that after recovery from his angiogram, he would be a candidate for Lovenox. All baseline labs obtained and are stable.  Goal of Therapy:  Heparin level 0.3-0.7 units/ml Monitor platelets by anticoagulation protocol: Yes   Plan:  1) IV heparin 1800 unit bolus then 2) IV heparin 1100 units/hr 3) Check heparin level 6 hr after starting IV heparin 4) Daily CBC and heparin level   Adrian Saran, PharmD, BCPS Pager [phone] 10/10/2013 7:33 PM

## 2013-10-11 ENCOUNTER — Telehealth: Payer: Self-pay | Admitting: *Deleted

## 2013-10-11 DIAGNOSIS — E46 Unspecified protein-calorie malnutrition: Secondary | ICD-10-CM

## 2013-10-11 DIAGNOSIS — K311 Adult hypertrophic pyloric stenosis: Secondary | ICD-10-CM

## 2013-10-11 DIAGNOSIS — I999 Unspecified disorder of circulatory system: Secondary | ICD-10-CM

## 2013-10-11 DIAGNOSIS — E860 Dehydration: Secondary | ICD-10-CM

## 2013-10-11 DIAGNOSIS — I771 Stricture of artery: Secondary | ICD-10-CM

## 2013-10-11 DIAGNOSIS — D638 Anemia in other chronic diseases classified elsewhere: Secondary | ICD-10-CM

## 2013-10-11 DIAGNOSIS — E876 Hypokalemia: Secondary | ICD-10-CM

## 2013-10-11 DIAGNOSIS — R748 Abnormal levels of other serum enzymes: Secondary | ICD-10-CM

## 2013-10-11 DIAGNOSIS — Z515 Encounter for palliative care: Secondary | ICD-10-CM

## 2013-10-11 DIAGNOSIS — I82409 Acute embolism and thrombosis of unspecified deep veins of unspecified lower extremity: Secondary | ICD-10-CM

## 2013-10-11 LAB — COMPREHENSIVE METABOLIC PANEL
ALBUMIN: 1.9 g/dL — AB (ref 3.5–5.2)
ALK PHOS: 1045 U/L — AB (ref 39–117)
ALT: 94 U/L — ABNORMAL HIGH (ref 0–53)
AST: 92 U/L — AB (ref 0–37)
BUN: 86 mg/dL — AB (ref 6–23)
CHLORIDE: 91 meq/L — AB (ref 96–112)
CO2: >45 mEq/L (ref 19–32)
Calcium: 8.7 mg/dL (ref 8.4–10.5)
Creatinine, Ser: 1.56 mg/dL — ABNORMAL HIGH (ref 0.50–1.35)
GFR calc Af Amer: 55 mL/min — ABNORMAL LOW (ref >90)
GFR calc non Af Amer: 47 mL/min — ABNORMAL LOW (ref >90)
Glucose, Bld: 122 mg/dL — ABNORMAL HIGH (ref 70–99)
Potassium: 2.9 mEq/L — CL (ref 3.7–5.3)
Sodium: 154 mEq/L — ABNORMAL HIGH (ref 137–147)
TOTAL PROTEIN: 5.8 g/dL — AB (ref 6.0–8.3)
Total Bilirubin: 7.6 mg/dL — ABNORMAL HIGH (ref 0.3–1.2)

## 2013-10-11 LAB — CBC
HCT: 30.7 % — ABNORMAL LOW (ref 39.0–52.0)
HEMOGLOBIN: 9.6 g/dL — AB (ref 13.0–17.0)
MCH: 31.7 pg (ref 26.0–34.0)
MCHC: 31.3 g/dL (ref 30.0–36.0)
MCV: 101.3 fL — ABNORMAL HIGH (ref 78.0–100.0)
PLATELETS: 351 10*3/uL (ref 150–400)
RBC: 3.03 MIL/uL — AB (ref 4.22–5.81)
RDW: 16.9 % — ABNORMAL HIGH (ref 11.5–15.5)
WBC: 9 10*3/uL (ref 4.0–10.5)

## 2013-10-11 LAB — GLUCOSE, CAPILLARY: GLUCOSE-CAPILLARY: 103 mg/dL — AB (ref 70–99)

## 2013-10-11 LAB — TSH: TSH: 2.186 u[IU]/mL (ref 0.350–4.500)

## 2013-10-11 LAB — HEPARIN LEVEL (UNFRACTIONATED)
HEPARIN UNFRACTIONATED: 0.32 [IU]/mL (ref 0.30–0.70)
Heparin Unfractionated: 0.23 IU/mL — ABNORMAL LOW (ref 0.30–0.70)

## 2013-10-11 MED ORDER — HEPARIN (PORCINE) IN NACL 100-0.45 UNIT/ML-% IJ SOLN
1250.0000 [IU]/h | INTRAMUSCULAR | Status: DC
  Filled 2013-10-11 (×2): qty 250

## 2013-10-11 MED ORDER — FREE WATER: 200.0000 mL | Freq: Three times a day (TID) | Status: AC

## 2013-10-11 MED ORDER — HEPARIN (PORCINE) IN NACL 100-0.45 UNIT/ML-% IJ SOLN
1300.0000 [IU]/h | INTRAMUSCULAR | Status: DC
  Administered 2013-10-11: 1300 [IU]/h via INTRAVENOUS
  Filled 2013-10-11 (×3): qty 250

## 2013-10-11 MED ORDER — ONDANSETRON HCL 4 MG PO TABS: 4.0000 mg | ORAL_TABLET | Freq: Four times a day (QID) | ORAL | Status: AC | PRN

## 2013-10-11 MED ORDER — ENOXAPARIN SODIUM 30 MG/0.3ML ~~LOC~~ SOLN
50.0000 mg | SUBCUTANEOUS | Status: AC
Start: 2013-10-11 — End: ?

## 2013-10-11 MED ORDER — OXYCODONE HCL 5 MG/5ML PO SOLN: 15.0000 mg | ORAL | Status: AC | PRN

## 2013-10-11 MED ORDER — POTASSIUM CHLORIDE 10 MEQ/100ML IV SOLN
10.0000 meq | INTRAVENOUS | Status: AC
  Administered 2013-10-11 (×3): 10 meq via INTRAVENOUS
  Filled 2013-10-11 (×3): qty 100

## 2013-10-11 NOTE — Progress Notes (Signed)
CRITICAL VALUE ALERT  Critical value received:  K 2.9, CO2 > 45  Date of notification:  10/11/13  Time of notification:  0342  Critical value read back:yes  Nurse who received alert:  Noreene Larsson RN  MD notified (1st page):  Rogue Bussing NP  Time of first page:  0345  Responding MD:  Rogue Bussing NP  Time MD responded:  [phone]

## 2013-10-11 NOTE — Progress Notes (Signed)
IP PROGRESS NOTE  Subjective:   He reports no significant pain at present.  Objective: Vital signs in last 24 hours: Blood pressure 104/67, pulse 97, temperature 97.9 F (36.6 C), temperature source Oral, resp. rate 16, height 5\' 8"  (1.727 m), weight 108 lb (48.988 kg), SpO2 94.00%.  Intake/Output from previous day: 01/22 0701 - 01/23 0700 In: 2123.8 [P.O.:1000; I.V.:873.8] Out: 3000 [Drains:3000]  Physical Exam:  HEENT: Scleral icterus, the mouth is dry Lungs: Clear bilaterally Cardiac: Regular rate and rhythm Abdomen: Mid abdominal mass, nontender, left upper quadrant gastrostomy tube and jejunostomy 2 Extremities: Pitting edema at the right foot, cool at the left greater than right foot with light purplish discoloration of the left toes Neurologic: Alert, confused, follows commands  Portacath/PICC-without erythema  Lab Results:  Recent Labs  10/10/13 2030 10/11/13 0245  WBC 11.0* 9.0  HGB 9.2* 9.6*  HCT 29.3* 30.7*  PLT 423* 351    BMET  Recent Labs  10/10/13 2030 10/11/13 0245  NA 155* 154*  K 2.4* 2.9*  CL 91* 91*  CO2 >45* >45*  GLUCOSE 120* 122*  BUN 82* 86*  CREATININE 1.26 1.56*  CALCIUM 9.3 8.7    Studies/Results: US Abdomen Limited  10/10/2013   CLINICAL DATA:  Gastric cancer.  Elevated bilirubin.  EXAM: US ABDOMEN LIMITED - RIGHT UPPER QUADRANT  COMPARISON:  CT abdomen and pelvis 08/13/2013.  FINDINGS: Gallbladder:  The gallbladder is collapsed.  Common bile duct:  Diameter: The common bile duct is markedly enlarged, measuring up to 14 mm.  Liver:  Moderate intrahepatic biliary dilation is evident. No discrete hepatic mass is evident.  Note is made of moderate right-sided hydronephrosis. Moderate abdominal ascites are present.  IMPRESSION: 1. Marked intrahepatic biliary dilation. This is concerning for an obstructing mass. Recommend CT of the abdomen with contrast for further evaluation. 2. Moderate right-sided hydronephrosis. 3. Moderate abdominal  ascites.   Electronically Signed   By: Lawrence Santiago M.D.   On: 10/10/2013 12:20    Medications: I have reviewed the patient's current medications.  Assessment/Plan:  1. Gastric cancer, stage IV, tumor involving the distal one third of the stomach, transverse colon, mesentery of the transverse colon/small bowel, retroperitoneum and pelvic "drop metastases" identified at the time of surgery 09/09/2013.  2. 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  3. Malnutrition. Maintained on jejunostomy tube feedings.  4. Ascites. Likely malignant. Cytology not submitted on 09/09/2013.  5. COPD.  6. Elevated liver enzymes/bilirubin. Initially felt to be related to TNA administered while hospitalized December 2014. Labs initially improved. Bilirubin and transaminases with progressive elevation. Biliary obstruction noted on an abdominal ultrasound 10/10/2013 7. Hypokalemia/dehydration 8. 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 9. Left leg pain, pallor, coolness, diminished pulses bilaterally. Doppler 10/10/2013 confirmed an acute left lower Jevity DVT and stenosis of the left popliteal artery, now maintained on heparin   Mr. Huskins has metastatic gastric cancer. He has multiple complicating conditions including gastric outlet obstruction, malnutrition, dehydration, left lower extremity thrombosis/stenosis, and biliary obstruction. His prognosis for survival beyond days to weeks is poor. I discussed the prognosis with Mr. Honeycutt and his wife yesterday and he was in agreement hospice care.    The goal is comfort. Placement of a percutaneous biliary drain could palliate pruritus/cholangitis. He is not a candidate for surgical intervention for the left leg ischemia, but may be a candidate for  a percutaneous intervention. I will defer to Dr. Bridgett Larsson  regarding the indication for proceeding with an angiogram/intervention procedure. I think it is reasonable to proceed with a strict comfort care approach.   Mr. Magri is confused this morning and his wife is not present. He appears to be a candidate for residential hospice care at [facility] or the [facility].  Recommendations:  1.continue narcotic analgesics for pain  2.discussion with patient and family regarding switching to a pure comfort care approach with discontinuation of lab draws 3.hospice referral for home care versus residential hospice   I will return later today to discuss the situation with Mr. Arquette and his family. I discussed the case with doctors Lovena Le and Charlies Silvers.  Please call oncology over the weekend as needed .  LOS: 1 day   Leanard Dimaio  10/11/2013, 11:28 AM

## 2013-10-11 NOTE — Care Management Note (Signed)
Cm spoke with patient and spouse at bedside. Per spouse choice [facility] to provide hospice services upon discharge. [facility] intake center notified. H/P, demographics, and progress notes faxed to [phone]. Awaiting return call from Dr.Sherrill to agree to continue to follow pt as home hospice provider., Per spouse no DME required. Spouse requesting transportation. CSW made aware.

## 2013-10-11 NOTE — Progress Notes (Signed)
CSW received notification from [facility] that pt discharging home with hospice today and pt spouse requesting ambulance transportation to home.   CSW met with pt wife at bedside and confirmed address.  CSW compiled discharge paperwork needed for ambulance transport home including Gold DNR form and placed in [place]roo.  RNCM asked that unit RN not arrange transport until [facility] hears from [place] will notify unit RN when she hears from [place] wife asked that transport not be arranged until pt wife is able to get some things settled at home. CSW provided pt wife with phone number to the nursing unit in order to notify RN when things are settled at home.  RN please arrange non-emergency transport for pt to home. If before 5 pm, call [phone]. If after 5 pm, call [phone] option 1 for English and option 3 for non-emergency transport. (detailed instructions also placed on ambulance paperwork for RN).  No further social work needs identified at this time.  CSW signing off.   Alison Murray, MSW, [place] Work [phone]

## 2013-10-11 NOTE — Discharge Summary (Signed)
Physician Discharge Summary  Randy Le ZJI:[id number] DOB: 05/31/1956 DOA: 10/10/2013  PCP: Purvis Kilts, MD  Admit date: 10/10/2013 Discharge date: 10/11/2013  Recommendations for Outpatient Follow-up:  1. Pt wants to go home with hospice; patient and family did not want to proceed with any intervention   Discharge Diagnoses:  Principal Problem:   Biliary obstruction Active Problems:   Gastric cancer   Metastatic cancer   Limb ischemia   GERD (gastroesophageal reflux disease)   Dehydration   COPD (chronic obstructive pulmonary disease)   Protein-calorie malnutrition, severe   Hypernatremia   Hypokalemia   Anemia of chronic disease    Discharge Condition: stable for discharge home with hospice   Diet recommendation: has J tube  History of present illness:  58 yo M with past medical history of HTN, COPD, tobacco abuse, recent diagnosis of gastric cancer (in December of 2014), prolonged hospitalization for gastric outlet obstruction due to gastric cancer and status post J tube for nutrition and G tube for draining. Pt presented to [facility] ED 10/10/2013 with leg pain, abdominal pain and back pain. He has ongoing abdominal pain from gastric cancer but this time he presented with more jaundice. In ED, his vitals are as follows: BP was 93/59 which improved with IV fluids to 119/70, HR ws 103-111, Tmax was 97.3 F and oxygen saturation was 95% on 2 L nasal canula. His blood work was significant for sodium of 155, potassium of 2.8, CO2 of 45 and BUN of 80, normal creatinine. CBC revealed WBC count of 12, hemoglobin of 10.4 and platelets of 443. Liver enzymes were ALP 1038, AST 94, ALT 105 and Bilirubin 7.01 which is significant elevation since 09/12/2014. Abdominal US revealed marked intrahepatic biliary dilation concerning for an obstructing mass. In addition, his distal pulses were not palpable and his legs were cold to touch for which reason vascula surgery was consulted.   Assessment  and Plan:   Principal Problem:  Biliary obstruction  - likely from advanced gastric cancer  - Abdominal US revealed marked intrahepatic biliary dilation concerning for an obstructing mass  - Family does not want any drains put in any more; they want to focus on comfort and want to go home with hospice  - LFT's markedly elevated since 09/12/2013; ALP 1038, AST 94, ALT 105 and Bilirubin 7.01  Active Problems:  Gastric cancer  - will go home with hospice  Limb ischemia  - vascular surgery consulted  - recommended to start heparin drip but no intervention as per family wishes - will go home with Lovenox sub Q injections  GERD (gastroesophageal reflux disease)  - may continue protonix  Hypernatremia secondary to dehydration  - will start free water and NS  COPD (chronic obstructive pulmonary disease)  - stable  Protein-calorie malnutrition, severe  - has J tube; malnutrition due to advanced carcinoma  Hypokalemia  - repleted in ED - follow up BMP in am  Anemia of chronic disease  - secondary to gastric cancer  - hemoglobin 10.4 on admission  - no indications for transfusion    Code Status: Full  Family Communication: Pt at bedside   Signed:  Leisa Lenz, MD  [facility] 10/11/2013, 2:27 PM  Pager #: [phone]  Procedures:  None   Consultations:  Oncology  Palliative care   Vascular surgery   Discharge Exam: Filed Vitals:   10/11/13 0425  BP: 104/67  Pulse: 97  Temp: 97.9 F (36.6 C)  Resp: 16   Filed Vitals:  10/10/13 1428 10/10/13 1847 10/10/13 2302 10/11/13 0425  BP: 97/66 119/70 97/66 104/67  Pulse: 111 103 108 97  Temp:  97.3 F (36.3 C) 97.8 F (36.6 C) 97.9 F (36.6 C)  TempSrc: Oral Axillary Oral Oral  Resp: 25 20  16   Height:  5\' 8"  (1.727 m)    Weight:  48.988 kg (108 lb)    SpO2: 95% 100% 94% 94%    General: Pt is alert, disoriented, jaundiced  Cardiovascular: Regular rate and rhythm, S1/S2 appreciated  Respiratory: Clear  to auscultation bilaterally, no wheezing, no crackles, no rhonchi Abdominal: Soft, non tender, non distended, bowel sounds +, no guarding; has G and J tube Extremities: mottling  Toes, left leg DP not palpable, on right side faintly palpable pulses  Neuro: Grossly nonfocal  Discharge Instructions  Discharge Orders   Future Appointments Provider Department Dept Phone   10/16/2013 10:15 AM [facility] [phone]   10/16/2013 10:45 AM Owens Shark, NP [facility] [phone]   10/16/2013 11:45 AM [facility] [phone]   10/16/2013 1:45 PM Karie Mainland, [place] [facility] [phone]   25-Oct-2013 10:30 AM [facility] [place] [facility] [phone]   Future Orders Complete By Expires   Call MD for:  difficulty breathing, headache or visual disturbances  As directed    Call MD for:  persistant dizziness or light-headedness  As directed    Call MD for:  persistant nausea and vomiting  As directed    Call MD for:  severe uncontrolled pain  As directed    Diet - low sodium heart healthy  As directed    Increase activity slowly  As directed        Medication List         enoxaparin 30 MG/0.3ML injection  Commonly known as:  LOVENOX  Inject 0.5 mLs (50 mg total) into the skin daily.     feeding supplement (VITAL AF 1.2 CAL) Liqd  Increase Vital AF 1.2 to 120 ml via jejunostomy for 16 hours daily.  Flush feeding tube with 180 ml free water 5 times daily.     free water Soln  Place 200 mLs into feeding tube every 8 (eight) hours.     heparin lock flush 100 UNIT/ML Soln injection  2.5 mLs (250 Units total) by Intracatheter route 2 (two) times a week.     loperamide 2 MG capsule  Commonly known as:  IMODIUM  Take 2 capsules (4 mg total) by mouth as needed for diarrhea or loose stools (Take four  times daily as needed to control diarrhea (may use liquid form in J-tube if not able to swallow)).     Normal Saline Flush 0.9 % Soln  Flush each lumen of PICC with 25mL twice weekly.     ondansetron 4 MG tablet  Commonly known as:  ZOFRAN  Take 1 tablet (4 mg total) by mouth every 6 (six) hours as needed for nausea.     oxyCODONE 5 MG/5ML solution  Commonly known as:  ROXICODONE  Place 15 mLs (15 mg total) into feeding tube every 4 (four) hours as needed for moderate pain or severe pain.     potassium chloride 20 MEQ/15ML (10%) Soln  Place 15 mLs (20 mEq total) into feeding tube 2 (two) times daily. Take 15 ml three times daily today via J-tube, then  twice daily            Follow-up Information   Follow up with Purvis Kilts, MD. (As needed)    Specialty:  Family Medicine   Contact information:   [address] [place] [address] [phone]        The results of significant diagnostics from this hospitalization (including imaging, microbiology, ancillary and laboratory) are listed below for reference.    Significant Diagnostic Studies: US Abdomen Limited  10/10/2013   CLINICAL DATA:  Gastric cancer.  Elevated bilirubin.  EXAM: US ABDOMEN LIMITED - RIGHT UPPER QUADRANT  COMPARISON:  CT abdomen and pelvis 08/13/2013.  FINDINGS: Gallbladder:  The gallbladder is collapsed.  Common bile duct:  Diameter: The common bile duct is markedly enlarged, measuring up to 14 mm.  Liver:  Moderate intrahepatic biliary dilation is evident. No discrete hepatic mass is evident.  Note is made of moderate right-sided hydronephrosis. Moderate abdominal ascites are present.  IMPRESSION: 1. Marked intrahepatic biliary dilation. This is concerning for an obstructing mass. Recommend CT of the abdomen with contrast for further evaluation. 2. Moderate right-sided hydronephrosis. 3. Moderate abdominal ascites.   Electronically Signed   By: Lawrence Santiago M.D.   On:  10/10/2013 12:20    Microbiology: No results found for this or any previous visit (from the past 240 hour(s)).   Labs: Basic Metabolic Panel:  Recent Labs Lab 10/10/13 0820 10/10/13 0820 10/10/13 2030 10/11/13 0245  NA 155*  --  155* 154*  K 2.8*  --  2.4* 2.9*  CL  --   --  91* 91*  CO2 >45*  --  >45* >45*  GLUCOSE 162*  --  120* 122*  BUN 79.6*  --  82* 86*  CREATININE 1.2  --  1.26 1.56*  CALCIUM 10.0  --  9.3 8.7  MG  --  2.8* 3.1*  --   PHOS  --  4.6 4.8*  --    Liver Function Tests:  Recent Labs Lab 10/10/13 0820 10/10/13 2030 10/11/13 0245  AST 94* 89* 92*  ALT 105* 96* 94*  ALKPHOS 1,038* 994* 1045*  BILITOT 7.01* 7.3* 7.6*  PROT 6.5 6.1 5.8*  ALBUMIN 2.1* 2.1* 1.9*   No results found for this basename: LIPASE, AMYLASE,  in the last 168 hours No results found for this basename: AMMONIA,  in the last 168 hours CBC:  Recent Labs Lab 10/10/13 1535 10/10/13 2030 10/11/13 0245  WBC 12.0* 11.0* 9.0  NEUTROABS 9.9* 9.1*  --   HGB 10.4* 9.2* 9.6*  HCT 32.4* 29.3* 30.7*  MCV 98.2 100.3* 101.3*  PLT 443* 423* 351   Cardiac Enzymes:  Recent Labs Lab 10/10/13 1535  CKTOTAL 137   BNP: BNP (last 3 results)  Recent Labs  08/22/13 1727  PROBNP 113.8   CBG:  Recent Labs Lab 10/11/13 0753  GLUCAP 103*    Time coordinating discharge: Over 30 minutes

## 2013-10-11 NOTE — Progress Notes (Signed)
Physical Therapy Discharge Patient Details Name: Randy Le MRN: [id number] DOB: 08/01/1956 Today's Date: 10/11/2013 Time:  -     Patient discharged from PT services secondary to Pt is to DC to home w/ Hospice. GP     Marcelino Freestone PT [phone]  10/11/2013, 2:37 PM

## 2013-10-11 NOTE — Consult Note (Signed)
Patient Randy Le      DOB: 03-Jun-1956      HYQ:[id number]     Consult Note from the Palliative Medicine Team at [facility] Requested by:  Dr. Charlies Silvers     PCP: Purvis Kilts, MD Reason for Consultation: [place]    Phone Number:[phone]  Assessment of patients Current state:58 year old white male with no gastric cancer now obstructing his biliary tree. Admitted with left leg pain found to have ischemia. Patient states that he desires to go home today with hospice. His wife is at the bedside and confirms this plan. Will assist in making plans for discharged home with rocking him hospice. Please see my note on the day of service. I have reviewed this case with Dr. Ammie Dalton.   Goals of Care: 1.  Code Status:Do not resuscitate   2. Scope of Treatment: Full comfort care   4. Disposition: Home with [facility] hospice referral   3. Symptom Management:   1. Pain: patient is getting reasonable relief with oxycodone via tube 2. Consider lovenox injections at home to promote comfort in ischemic limb 3. Care management consult for home with hospice  4. Psychosocial:  Married.  States if he can't work than he does not want to be a burden to his wife.  5. Spiritual:  Offered spiritual support        Patient Documents Completed or Given: Document Given Completed  Advanced Directives Pkt    MOST    DNR    Gone from My Sight    Hard Choices      Brief HPI: With metastatic gastric cancer. Admitted with left limb pain, findings consistent with ischemic limb. We were asked to assist with goals of care.   ROS: pain is reasonably managed, no nausea or vomiting. Sleepy.    PMH:  Past Medical History  Diagnosis Date  . Hypertension   . Reflux   . Cancer   . Stomach cancer      PSH: Past Surgical History  Procedure Laterality Date  . Hernia repair    . Laparotomy  04/08/2011    Procedure: EXPLORATORY LAPAROTOMY;  Surgeon: Jamesetta So;  Location:  [facility] ORS;  Service: General;  Laterality: N/A;  Exploratory Laparotomy with Partial Small Bowel Resection  . Small intestiine surgery    . External ear surgery    . Esophagogastroduodenoscopy N/A 08/23/2013    Procedure: ESOPHAGOGASTRODUODENOSCOPY (EGD);  Surgeon: Rogene Houston, MD;  Location: [facility] ENDO SUITE;  Service: Endoscopy;  Laterality: N/A;  . Eus N/A 08/29/2013    Procedure: ESOPHAGEAL ENDOSCOPIC ULTRASOUND (EUS) RADIAL;  Surgeon: Milus Banister, MD;  Location: [facility] ENDOSCOPY;  Service: Endoscopy;  Laterality: N/A;  plus/minus linear  . Jejunostomy N/A 09/09/2013    Procedure: Shanon Rosser;  Surgeon: Shann Medal, MD;  Location: [facility] ORS;  Service: General;  Laterality: N/A;   I have reviewed the [place] and SH and  If appropriate update it with new information. No Known Allergies Scheduled Meds: . free water  200 mL Per Tube Q8H  . pantoprazole (PROTONIX) IV  40 mg Intravenous Q12H  . potassium chloride  20 mEq Per Tube BID   Continuous Infusions: . sodium chloride 75 mL/hr at 10/10/13 1932  . sodium chloride 75 mL/hr at 10/11/13 1124  . heparin 1,300 Units/hr (10/11/13 1133)   PRN Meds:.acetaminophen, acetaminophen, HYDROmorphone (DILAUDID) injection, loperamide, ondansetron (ZOFRAN) IV, ondansetron, oxyCODONE    BP 104/67  Pulse 97  Temp(Src) 97.9 F (  36.6 C) (Oral)  Resp 16  Ht 5\' 8"  (1.727 m)  Wt 48.988 kg (108 lb)  BMI 16.42 kg/m2  SpO2 94%   PPS:20%   Intake/Output Summary (Last 24 hours) at 10/11/13 1339 Last data filed at 10/11/13 0600  Gross per 24 hour  Intake 2123.75 ml  Output   3000 ml  Net -876.25 ml    Physical Exam: Gen.: haggard appearing cachectic male , new acute distress, somnolent but rouable pupils equal round and reactive to light, extraocular muscles are intact, mucous membranes are dry chest is decreased but clear to auscultation, no rhonchi, rales  or wheezes cardiovascular tachycardic positive S1 and S2 extremity left limb cold and  mottled neuro somulent but rousable.   Labs: CBC    Component Value Date/Time   WBC 9.0 10/11/2013 0245   WBC 11.1* 10/04/2013 0914   RBC 3.03* 10/11/2013 0245   RBC 3.07* 10/04/2013 0914   HGB 9.6* 10/11/2013 0245   HGB 10.0* 10/04/2013 0914   HCT 30.7* 10/11/2013 0245   HCT 29.8* 10/04/2013 0914   PLT 351 10/11/2013 0245   PLT 228 10/04/2013 0914   MCV 101.3* 10/11/2013 0245   MCV 97.2 10/04/2013 0914   MCH 31.7 10/11/2013 0245   MCH 32.6 10/04/2013 0914   MCHC 31.3 10/11/2013 0245   MCHC 33.5 10/04/2013 0914   RDW 16.9* 10/11/2013 0245   RDW 15.9* 10/04/2013 0914   LYMPHSABS 1.3 10/10/2013 2030   LYMPHSABS 0.8* 10/04/2013 0914   MONOABS 0.4 10/10/2013 2030   MONOABS 0.5 10/04/2013 0914   EOSABS 0.1 10/10/2013 2030   EOSABS 0.0 10/04/2013 0914   BASOSABS 0.0 10/10/2013 2030   BASOSABS 0.0 10/04/2013 0914    BMET    Component Value Date/Time   NA 154* 10/11/2013 0245   NA 155* 10/10/2013 0820   K 2.9* 10/11/2013 0245   K 2.8* 10/10/2013 0820   CL 91* 10/11/2013 0245   CO2 >45* 10/11/2013 0245   CO2 >45* 10/10/2013 0820   GLUCOSE 122* 10/11/2013 0245   GLUCOSE 162* 10/10/2013 0820   BUN 86* 10/11/2013 0245   BUN 79.6* 10/10/2013 0820   CREATININE 1.56* 10/11/2013 0245   CREATININE 1.2 10/10/2013 0820   CALCIUM 8.7 10/11/2013 0245   CALCIUM 10.0 10/10/2013 0820   GFRNONAA 47* 10/11/2013 0245   GFRAA 55* 10/11/2013 0245    CMP     Component Value Date/Time   NA 154* 10/11/2013 0245   NA 155* 10/10/2013 0820   K 2.9* 10/11/2013 0245   K 2.8* 10/10/2013 0820   CL 91* 10/11/2013 0245   CO2 >45* 10/11/2013 0245   CO2 >45* 10/10/2013 0820   GLUCOSE 122* 10/11/2013 0245   GLUCOSE 162* 10/10/2013 0820   BUN 86* 10/11/2013 0245   BUN 79.6* 10/10/2013 0820   CREATININE 1.56* 10/11/2013 0245   CREATININE 1.2 10/10/2013 0820   CALCIUM 8.7 10/11/2013 0245   CALCIUM 10.0 10/10/2013 0820   PROT 5.8* 10/11/2013 0245   PROT 6.5 10/10/2013 0820   ALBUMIN 1.9* 10/11/2013 0245   ALBUMIN 2.1* 10/10/2013 0820   AST 92*  10/11/2013 0245   AST 94* 10/10/2013 0820   ALT 94* 10/11/2013 0245   ALT 105* 10/10/2013 0820   ALKPHOS 1045* 10/11/2013 0245   ALKPHOS 1,038* 10/10/2013 0820   BILITOT 7.6* 10/11/2013 0245   BILITOT 7.01* 10/10/2013 0820   GFRNONAA 47* 10/11/2013 0245   GFRAA 55* 10/11/2013 0245   [place] 1. Marked intrahepatic biliary dilation. This is concerning  for an  obstructing mass. Recommend CT of the abdomen with contrast for  further evaluation.  2. Moderate right-sided hydronephrosis.  3. Moderate abdominal ascites  Discussed with Dr. Benay Spice   Time In Time Out Total Time Spent with Patient Total Overall Time  115 pm 145 pm 30 min 45min    Greater than 50%  of this time was spent counseling and coordinating care related to the above assessment and plan.  Lerry Cordrey L. Lovena Le, MD MBA The Palliative Medicine Team at [facility] Phone: [phone] Pager: [phone]

## 2013-10-11 NOTE — Progress Notes (Signed)
Thank you for consulting the Palliative Medicine Team at [facility] to meet your patient's and family's needs.   The reason that you asked [place] to see your patient is  For GOC  We have scheduled your patient for a meeting: 1/23  1230 pm  The Surrogate decision make is: Spouse Mrs. Gowell Contact information: see facesheet  Other family members that need to be present:     Your patient is able/unable to participate:  Additional Narrative:  Patient more confused.  Per Dr. Benay Spice not a chemo candidate. And   Taci Sterling L. Lovena Le, MD MBA The Palliative Medicine Team at [facility] Phone: [phone] Pager: [phone]

## 2013-10-11 NOTE — Discharge Instructions (Signed)
Hospice Care  °Hospice is a care service which can be used by people who are terminally ill and in whom healing is no longer thought possible. Hospice care is for people believed to have no more than 6 months to live. It is meant to help with the two largest fears near the end of life (the fears of dying and of being alone), as well as pain management, and an attempt to allow people to pass away comfortably at home.  °Hospice staff: °· Administer appropriate pain relief. °· Provide nursing care. °· Offer reassurance and support to loved ones and family members. °· Provide services to keep people comfortable at home or in a hospice facility. °Together, you can see to it that your loved one is not alone during this last and important phase of life. °You, your family, and your caregivers help you decide when hospice services should begin. If your condition improves or the disease goes into remission, you can be discharged from the hospice program. You can return to hospice care at a later time if needed. °The hospice philosophy recognizes death as the final stage of life. It helps patients continue an alert, pain-free life, and manage symptoms while surrounded by their loved ones. Hospice affirms life without hurrying death. Hospice care treats the person rather than the disease. It emphasizes quality of life with family-centered care. Hospice care involves the patient and family and helps them make decisions.  °The care is designed to: °· Relieve or decrease pain. °· Control other problems. °· Provide as much quality time as possible. °· Allow people to die with dignity. °Unlike other medical care, the focus is no longer on curing disease. The goal of hospice care is to offer as high a quality of life as possible during the end of life. In this way, the last days of life may be spent with dignity.  °With hospice care, instead of spending the last weeks or months in a hospital, a person is with loved ones in the home  or a homelike setting. About 90 percent of hospice care is provided at home. But hospice is available wherever a person lives, including a nursing home or assisted-living residence. Some residential hospices designed specifically for hospice care also exist. Hospice care is available for many types of terminal illnesses. °Hospice services are meant to serve both the patient and family members. °· Comfort. In most cases, the individual stays in his or her home or in homelike surroundings instead of in a hospital. The core of hospice is a cooperative effort by family, friends and a team of professional and volunteer caregivers working together to meet your loved one's needs. This team supplies all necessary medicines and equipment. It works with both the person involved and family members to relieve pain and symptoms. °· Support. Individuals enjoy the support of loved ones by receiving much of the basic care from family and friends. A nurse may lead the team and coordinates the day-to-day care. A doctor is also part of the team. Chaplains and social workers are available to counsel the family and their loved one. They make sure emotional, spiritual, and social needs are being met. Trained volunteers perform a wide variety of tasks as needed, such as: °· Providing companionship. °· Doing light housekeeping. °· Preparing meals. °· Running errands. °· Providing respite for the family. °· Improving quality of life. Caring for someone who is dying is emotionally and physically demanding. This can be particularly true for family members   who are primary caregivers. But you can take comfort in knowing that hospice is an act of love that can improve the quality of life for all involved. Professionals are often available to tend to the needs of grieving family members as well.  Spiritual Care. Hospice care emphasizes the spiritual needs of you and your family. People differ in their spiritual needs and religious beliefs so  spiritual care is individualized to meet the persons' and family's needs. It may include helping you to look at what death means to you, to say good-bye, or to perform a specific religious ceremony or ritual. HOW TO SELECT A PROGRAM Most hospice programs are run by nonprofit, independent organizations. Some are affiliated with hospitals, nursing homes or home health care agencies. Some are for-profit organizations. You can learn about existing hospice programs in your area from your health caregivers. ASK THE FOLLOWING:  What services are available to the patient?  What services are offered to the family?  Are bereavement services available?  How involved are the family members?  How involved is the doctor?  Who makes up the hospice care team? How are they trained or screened?  How will the individual's pain and symptoms be managed?  If circumstances change, can services be provided in different settings, such as the home or the hospital?  Is the program reviewed and licensed by the state or certified in some other way?  Are all costs covered by insurance? How much you pay for hospice care can vary greatly. It depends on the length and type of care necessary and your insurance coverage. Medicare and most private insurance plans, including managed care organizations, cover hospice care. Hospice is also covered by Medicaid in most states. Some hospice programs provide services on a sliding fee scale, based on your ability to pay. They may also provide some durable medical equipment for support within the home. Document Released: 12/23/2003 Document Revised: 11/28/2011 Document Reviewed: 09/05/2005 [facility] Patient Information 2014 [place], [place].

## 2013-10-11 NOTE — Progress Notes (Signed)
ANTICOAGULATION CONSULT NOTE - Follow Up Consult  Pharmacy Consult for Heparin Indication: L popliteal DVT  No Known Allergies  Patient Measurements: Height: 5\' 8"  (172.7 cm) Weight: 108 lb (48.988 kg) IBW/kg (Calculated) : 68.4 Heparin Dosing Weight:   Vital Signs: Temp: 97.9 F (36.6 C) (01/23 0425) Temp src: Oral (01/23 0425) BP: 104/67 mmHg (01/23 0425) Pulse Rate: 97 (01/23 0425)  Labs:  Recent Labs  10/10/13 0820  10/10/13 1535 10/10/13 2030 10/11/13 0245  HGB  --   < > 10.4* 9.2* 9.6*  HCT  --   --  32.4* 29.3* 30.7*  PLT  --   --  443* 423* 351  APTT  --   --  29 31  --   LABPROT  --   --  14.4 14.2  --   INR  --   --  1.14 1.12  --   HEPARINUNFRC  --   --   --   --  0.23*  CREATININE 1.2  --   --  1.26 1.56*  CKTOTAL  --   --  137  --   --   < > = values in this interval not displayed.  Estimated Creatinine Clearance: 35.8 ml/min (by C-G formula based on Cr of 1.56).   Medications:  Infusions:  . sodium chloride 75 mL/hr at 10/10/13 1932  . sodium chloride 75 mL/hr at 10/10/13 2049  . heparin 1,250 Units/hr (10/11/13 3235)    Assessment: Patient with low heparin level.  No issues per RN.  Goal of Therapy:  Heparin level 0.3-0.7 units/ml Monitor platelets by anticoagulation protocol: Yes   Plan:  Increase heparin to 1250 units/hr.  Recheck level at [place], Ripley Crowford 10/11/2013,5:27 AM

## 2013-10-11 NOTE — Consult Note (Signed)
Patient Randy Le      DOB: 01-May-1956      JIR:[id number]  Summary of goals of Care; full note to follow:  Met with patient and his Wife. [name] is intermittently somnolent but is able to wake up and express himself.  He states if he can't work and can't enjoy his wife that it is "time".  He expressed that he is placing strain on his wife that he doesn't want to do that, and he understands that his cancer and his leg have no cures.  He just wants to be comfortable at home.  His wife is supportive of this.  He would like to go home today.  We discussed that his meds can be given through his tube or orally and hospice will help his wife with knowing how to do that.  We also talked about using the Hospice home if it got to hard at home.  Recommend:  1.  DNR  2.  Pain Meds: would use his oxycodone 15 mg every 4 as needed.  If pain persistent consider scheduling meds 5-10 q 2hrs.  3.  DVT:  On heparin consider lovenox injections instead of heparin vs discontinuing all together.  I think without it he will have increased pain and so for a short time lovenox might be consider comfort measures.  4.  Hospice at home referral through [facility].  Discussed with dr. Benay Spice.  Total time 1:15- 145  Donnia Poplaski L. Lovena Le, MD MBA The Palliative Medicine Team at [facility] Phone: [phone] Pager: [phone]

## 2013-10-11 NOTE — Progress Notes (Signed)
   BMET    Component Value Date/Time   NA 154* 10/11/2013 0245   NA 155* 10/10/2013 0820   K 2.9* 10/11/2013 0245   K 2.8* 10/10/2013 0820   CL 91* 10/11/2013 0245   CO2 >45* 10/11/2013 0245   CO2 >45* 10/10/2013 0820   GLUCOSE 122* 10/11/2013 0245   GLUCOSE 162* 10/10/2013 0820   BUN 86* 10/11/2013 0245   BUN 79.6* 10/10/2013 0820   CREATININE 1.56* 10/11/2013 0245   CREATININE 1.2 10/10/2013 0820   CALCIUM 8.7 10/11/2013 0245   CALCIUM 10.0 10/10/2013 0820   GFRNONAA 47* 10/11/2013 0245   GFRAA 55* 10/11/2013 0245   Cr climbing.  Now at 1.56 from baseline 0.7.    - Recommend canceling angiogram today and continue resuscitation for metabolic disorder and rehydration - will try to reschedule for Monday.   Adele Barthel, MD [facility] Office: [phone] Pager: [phone]  10/11/2013, 9:04 AM

## 2013-10-11 NOTE — Telephone Encounter (Signed)
Messages from [place], hospital case manager and Larena Glassman, RN for hospice asking if Dr. Benay Spice will be attending for hospice. YES, per MD. [facility] with this info.

## 2013-10-11 NOTE — Care Management Note (Signed)
Cm spoke with patient at the bedside concerning discharge planning. MD order for Cm to consult concerning home hospice. Pt provided with home hospice choice list for [facility]. Cm informed pt upon choice hospice rep can consult pt and spouse at bedside. No choice made at this time. Will continue to follow.     Venita Lick Mataya Kilduff,MSN,RN [phone]

## 2013-10-11 NOTE — Progress Notes (Signed)
PT Cancellation Note  Patient Details Name: Randy Le MRN: [id number] DOB: 1956/07/23   Cancelled Treatment:    Reason Eval/Treat Not Completed: Patient not medically ready (for LE angiogram. will need activity orders updated.)   Claretha Cooper 10/11/2013, 8:22 AM Tresa Endo PT [phone]

## 2013-10-11 NOTE — Progress Notes (Signed)
ANTICOAGULATION CONSULT NOTE - Initial Consult  Pharmacy Consult for IV heparin Indication: L popliteal DVT  No Known Allergies  Patient Measurements: Height: 5\' 8"  (172.7 cm) Weight: 108 lb (48.988 kg) IBW/kg (Calculated) : 68.4   Vital Signs: Temp: 97.9 F (36.6 C) (01/23 0425) Temp src: Oral (01/23 0425) BP: 104/67 mmHg (01/23 0425) Pulse Rate: 97 (01/23 0425)  Labs:  Recent Labs  10/10/13 0820  10/10/13 1535 10/10/13 2030 10/11/13 0245 10/11/13 1018  HGB  --   < > 10.4* 9.2* 9.6*  --   HCT  --   --  32.4* 29.3* 30.7*  --   PLT  --   --  443* 423* 351  --   APTT  --   --  29 31  --   --   LABPROT  --   --  14.4 14.2  --   --   INR  --   --  1.14 1.12  --   --   HEPARINUNFRC  --   --   --   --  0.23* 0.32  CREATININE 1.2  --   --  1.26 1.56*  --   CKTOTAL  --   --  137  --   --   --   < > = values in this interval not displayed.  Estimated Creatinine Clearance: 35.8 ml/min (by C-G formula based on Cr of 1.56).   Medical History: Past Medical History  Diagnosis Date  . Hypertension   . Reflux   . Cancer   . Stomach cancer     Medications:  Scheduled:  . free water  200 mL Per Tube Q8H  . pantoprazole (PROTONIX) IV  40 mg Intravenous Q12H  . potassium chloride  20 mEq Per Tube BID   Infusions:  . sodium chloride 75 mL/hr at 10/10/13 1932  . sodium chloride 75 mL/hr at 10/10/13 2049  . heparin 1,250 Units/hr (10/11/13 3825)    Assessment: 58 yo male presented with stage 4 gastric cancer, hypernatremia, hypokalemia, bili obstruction due to liver mets, critical limb ischemia LLE PAD, acute L popliteal DVT, Started IV heparin on 1/22 PM.  Heparin level at 1000 am today is therapeutic at low end of goal 0.32.  Will bump dose up just slightly and repeat level at 1800 tonight.   Angiogram cancelled for today, tentatively rescheduling for Monday   MD notes after angiogram patient may be candidate for lovenox  CBC  Okay, no bleeding/issues    Goal of  Therapy:  Heparin level 0.3-0.7 units/ml Monitor platelets by anticoagulation protocol: Yes   Plan:  1.) Increase heparin to 1300 units/hr, Repeat 6 hours heparin level at 1800  3.)  Daily CBC and heparin level  Maclain Cohron, Gaye Alken PharmD Pager #: [phone] 11:20 AM 10/11/2013

## 2013-10-11 NOTE — Care Management Note (Signed)
Cm confirmed with Dr.Sherrill that MD will follow patient for home hospice care. [facility] notified at [phone]. Pt's spouse informed to contact hospice upon arrival to residence.      Venita Lick Krystal Delduca,MSN,RN [phone]

## 2013-10-14 SURGERY — ABDOMINAL AORTAGRAM
Anesthesia: LOCAL

## 2013-10-16 ENCOUNTER — Inpatient Hospital Stay: Payer: Federal, State, Local not specified - PPO

## 2013-10-16 ENCOUNTER — Ambulatory Visit: Payer: Federal, State, Local not specified - PPO | Admitting: Nurse Practitioner

## 2013-10-16 ENCOUNTER — Telehealth: Payer: Self-pay | Admitting: *Deleted

## 2013-10-16 ENCOUNTER — Encounter: Payer: Federal, State, Local not specified - PPO | Admitting: Nutrition

## 2013-10-16 ENCOUNTER — Other Ambulatory Visit: Payer: Federal, State, Local not specified - PPO

## 2013-10-16 NOTE — Telephone Encounter (Signed)
Pt did not show for office visit, hospice pt.

## 2013-10-18 ENCOUNTER — Telehealth: Payer: Self-pay | Admitting: *Deleted

## 2013-10-20 NOTE — Telephone Encounter (Signed)
Received call from [facility] stating that pt passed away @ 1 pm today.  Note to Dr. Benay Spice.

## 2013-10-20 DEATH — deceased

## 2013-11-11 ENCOUNTER — Encounter: Payer: Self-pay | Admitting: Oncology

## 2013-11-11 NOTE — Progress Notes (Signed)
CALLED [employer] TO SEE IF 5FU AND LEUCOVORIN  REQUIRED PRE-CERT.  I SPOKE TO [facility] DID NOT REF #[id number]. I DID NOT REALIZE HE WAS DECEASED.

## 2013-11-26 ENCOUNTER — Encounter: Payer: Self-pay | Admitting: *Deleted

## 2014-03-20 IMAGING — CT CT ABD-PELV W/ CM
2 of 5 series · 13 of 36 positions shown, 16 images · IV contrast (Omnipaque 300)
Comparison: Multiple exams, including 08/02/2013 and 04/04/2011.

CLINICAL DATA: Dysphagia.  Weight loss.  Tobacco use.

EXAM:
CT CHEST, ABDOMEN, AND PELVIS WITH CONTRAST
TECHNIQUE: Multidetector CT imaging of the chest, abdomen and pelvis was
performed following the standard protocol during bolus
administration of intravenous contrast.
CONTRAST:  100mL OMNIPAQUE IOHEXOL 300 MG/ML  SOLN

[Series 2: cap with 5.0 b40f · axial · 0.69mm/px · z∈[-712,-152]mm · 10 of 132 slices shown, 13 images]
[im 10/132  mediastinal]
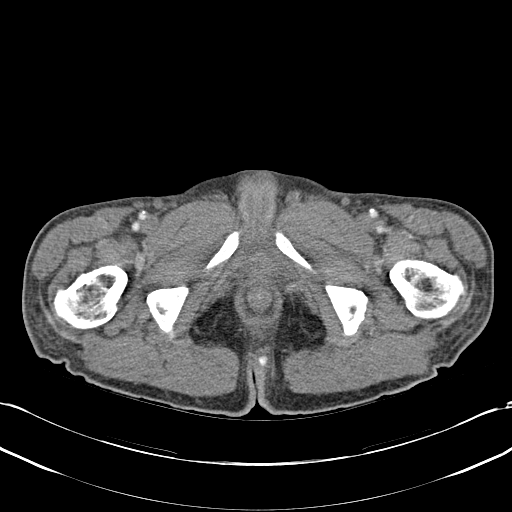
[im 10/132  lung]
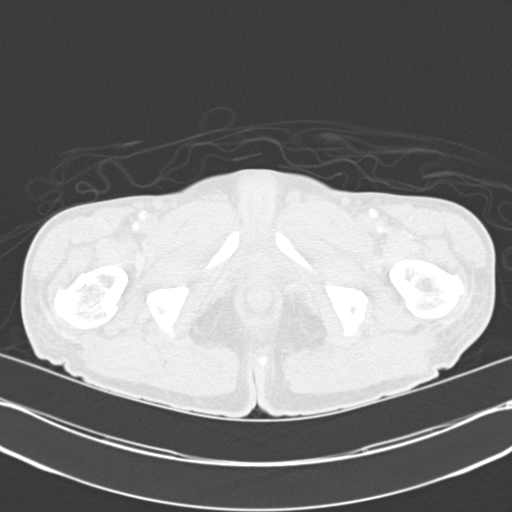
[im 19/132  lung]
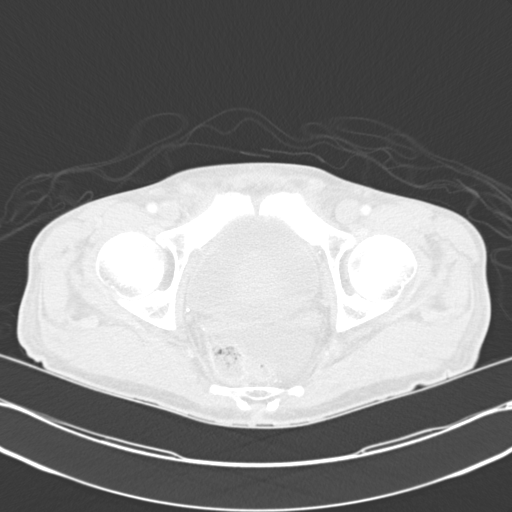
[im 38/132  lung]
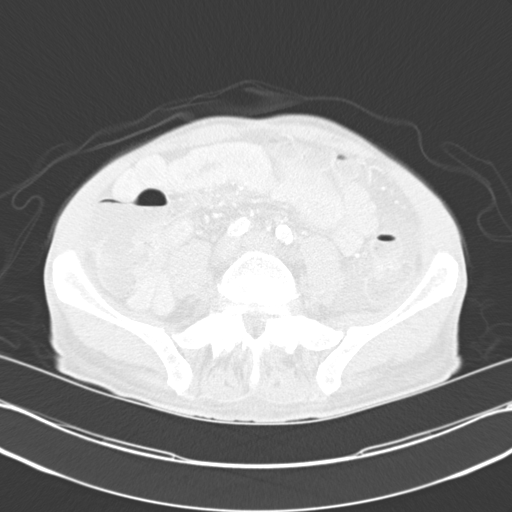
[im 47/132  lung]
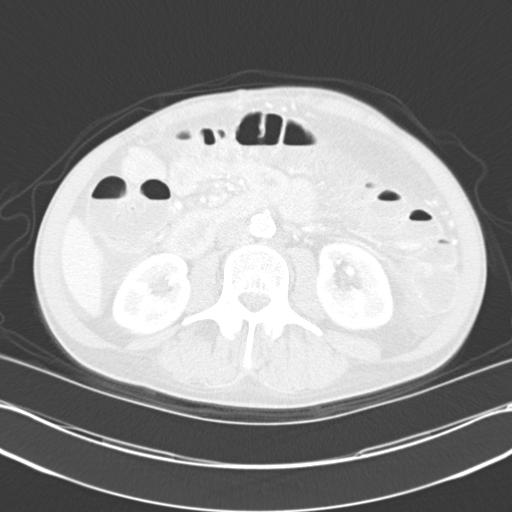
[im 57/132  mediastinal]
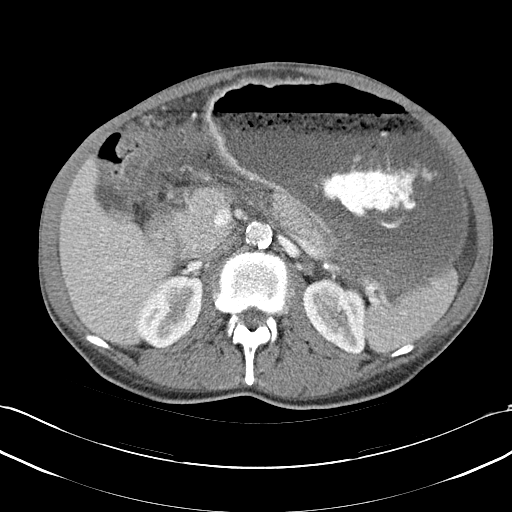
[im 57/132  lung]
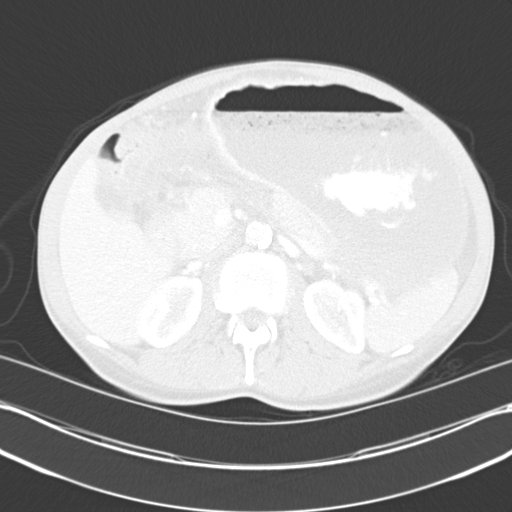
[im 75/132  lung]
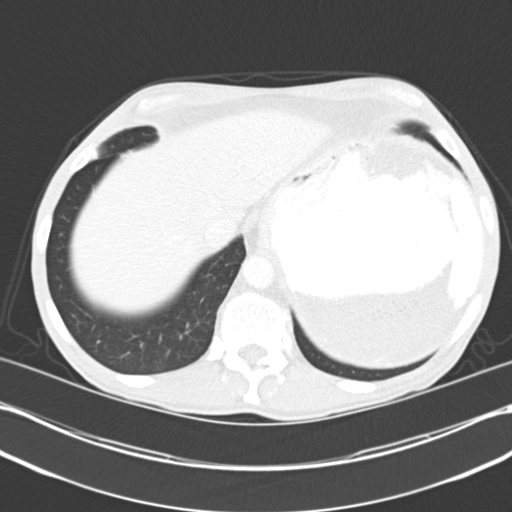
[im 85/132  lung]
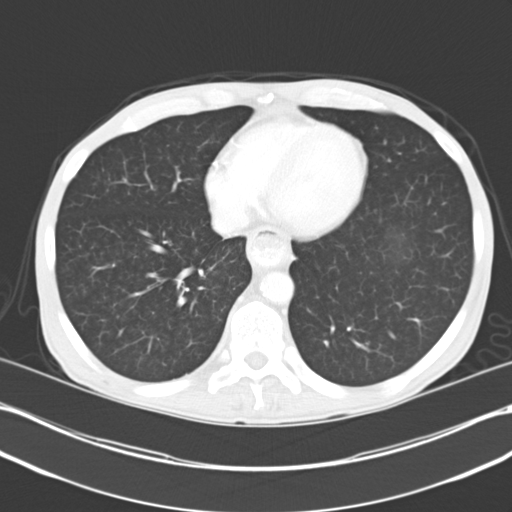
[im 94/132  lung]
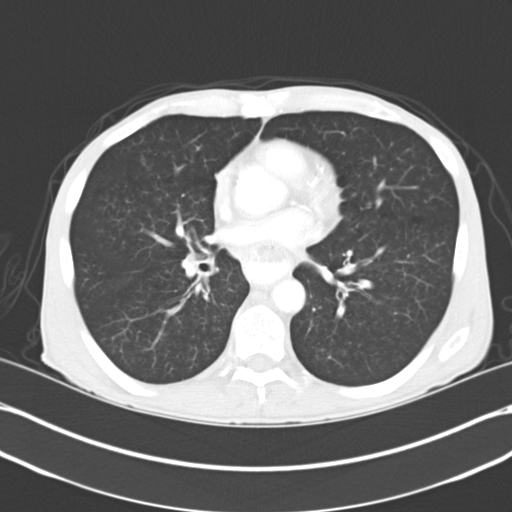
[im 113/132  mediastinal]
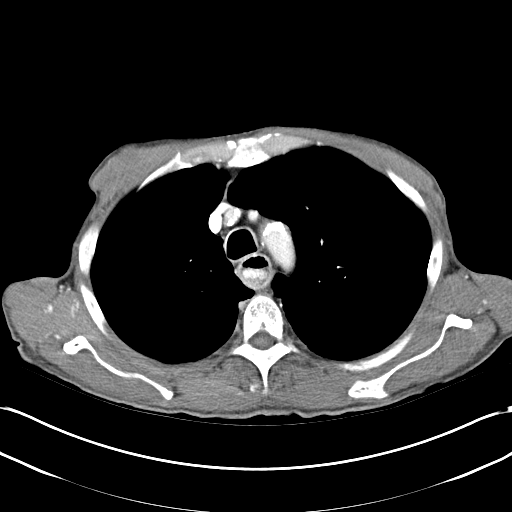
[im 113/132  lung]
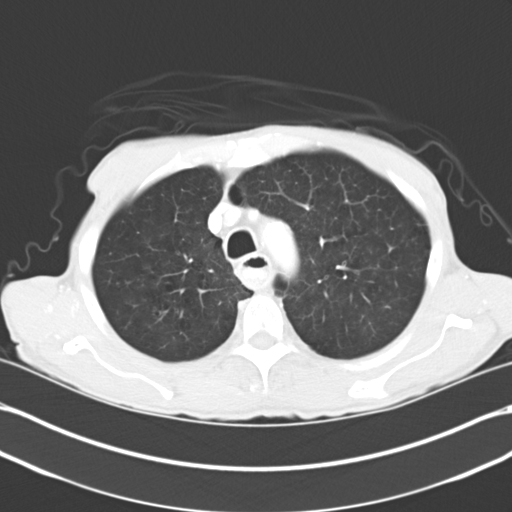
[im 122/132  lung]
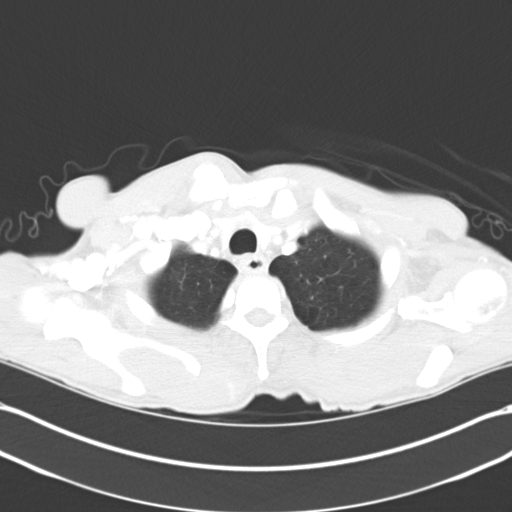

[Series 4: mpr cor post contrast (id) · coronal · 0.72mm/px · 3 of 83 slices shown]
[im 17/83  lung]
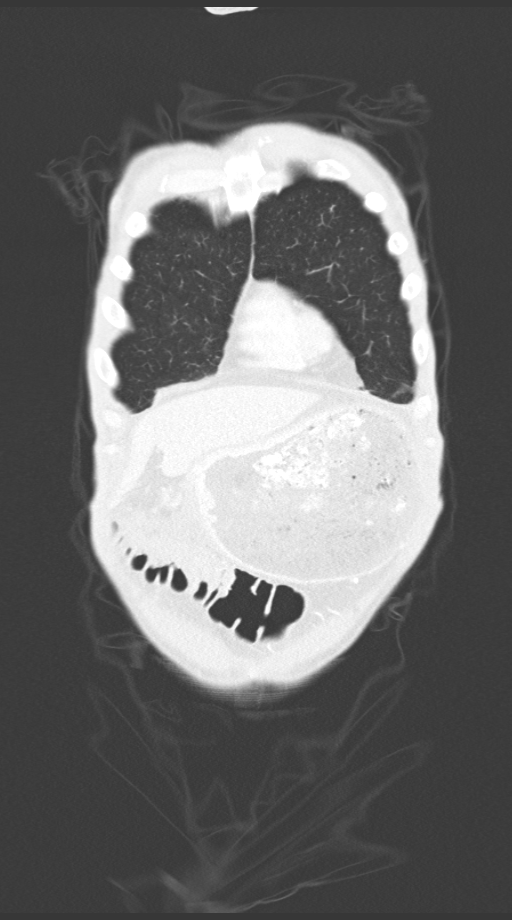
[im 33/83  lung]
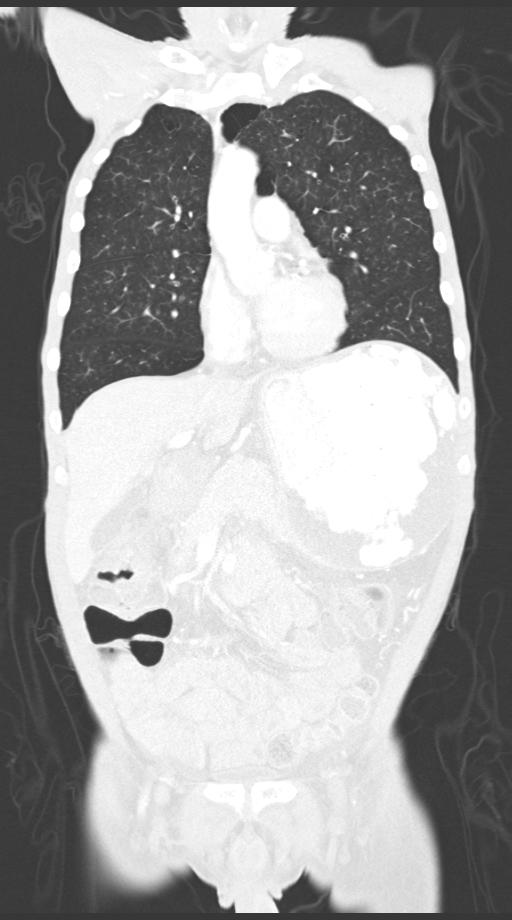
[im 50/83  lung]
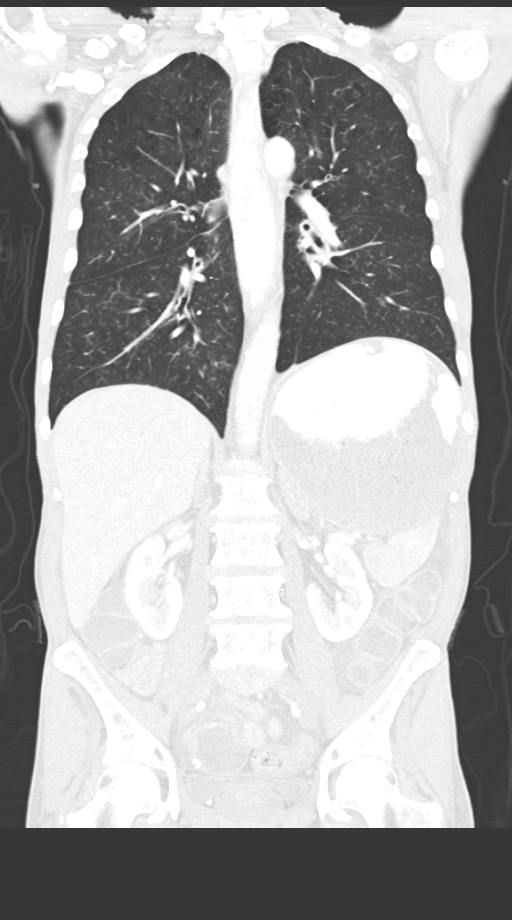

[13 of 36 positions shown; findings below may reference images not displayed]

FINDINGS: CT CHEST FINDINGS

Esophageal dilatation noted. Gastric dilatation noted. Patulous GE
junction with gastric contents refluxing up into the esophagus.

Small mediastinal lymph nodes are not pathologically enlarged.
coronary and aortic atherosclerosis noted. No pleural effusion.
Prominent centrilobular emphysema. No lung mass. Airway thickening
is noted particularly in the lower lobes. 4 mm nodule, left upper
lobe, image 15 of series 3. New

CT ABDOMEN AND PELVIS FINDINGS

Severe gastric distention. Gastric antral wall thickening. Diffuse
mesenteric and subcutaneous edema. Cachexia. Mild upper abdominal
ascites. Faint dependent density in the gallbladder could represent
small gallstones.

Spleen, liver, and pancreas unremarkable. Stable adrenal adenomas.
Small left mid kidney cyst. Small left kidney lower pole cyst.
Kidneys otherwise unremarkable.

Aortoiliac atherosclerotic vascular disease. Fluid noted in the left
pericolic gutter. Small to moderate amount of pelvic ascites. Cannot
exclude sigmoid colon wall thickening. Distal ileal anastomotic
bowel staple line in the right lower quadrant. Appendix not well
observed.

Prominent prostate gland indents the bladder base. Facet arthropathy
noted at L5-S1.
IMPRESSION: 1. Prominent gastric antral wall thickening may be due to tumor or
inflammation. Stomach body is distended as is the esophagus.
Patulous gastroesophageal junction with reflux.
2. Diffuse 3rd spacing of fluid in the subcutaneous and mesenteric
adipose tissue.
3. Cachexia
4. Airway thickening in the lower lobes, favoring bronchitis.
5. A 4 mm nodule in the left upper lobe. If the patient is at high
risk for bronchogenic carcinoma, follow-up chest CT at 1 year is
recommended. If the patient is at low risk, no follow-up is needed.
This recommendation follows the consensus statement: Guidelines for
Management of Small Pulmonary Nodules Detected on CT Scans: A
Statement from the [HOSPITAL] as published in Radiology
0440; [DATE].
6. Stable adrenal adenomas.
7. Moderate ascites.
8. Prominent prostate gland
9. Lower lumbar facet arthropathy.

## 2014-03-29 IMAGING — CR DG ABDOMEN ACUTE W/ 1V CHEST
3 series · 3 of 3 positions shown · non-contrast
Comparison: CT 08/13/2013.  Plain film chest 08/02/2013.

CLINICAL DATA: Abdominal pain and lower extremity swelling. History
of small bowel resection. Smoker and hypertension.

EXAM:
ACUTE ABDOMEN SERIES (ABDOMEN 2 VIEW & CHEST 1 VIEW)

[view not recorded (1 of 3)]
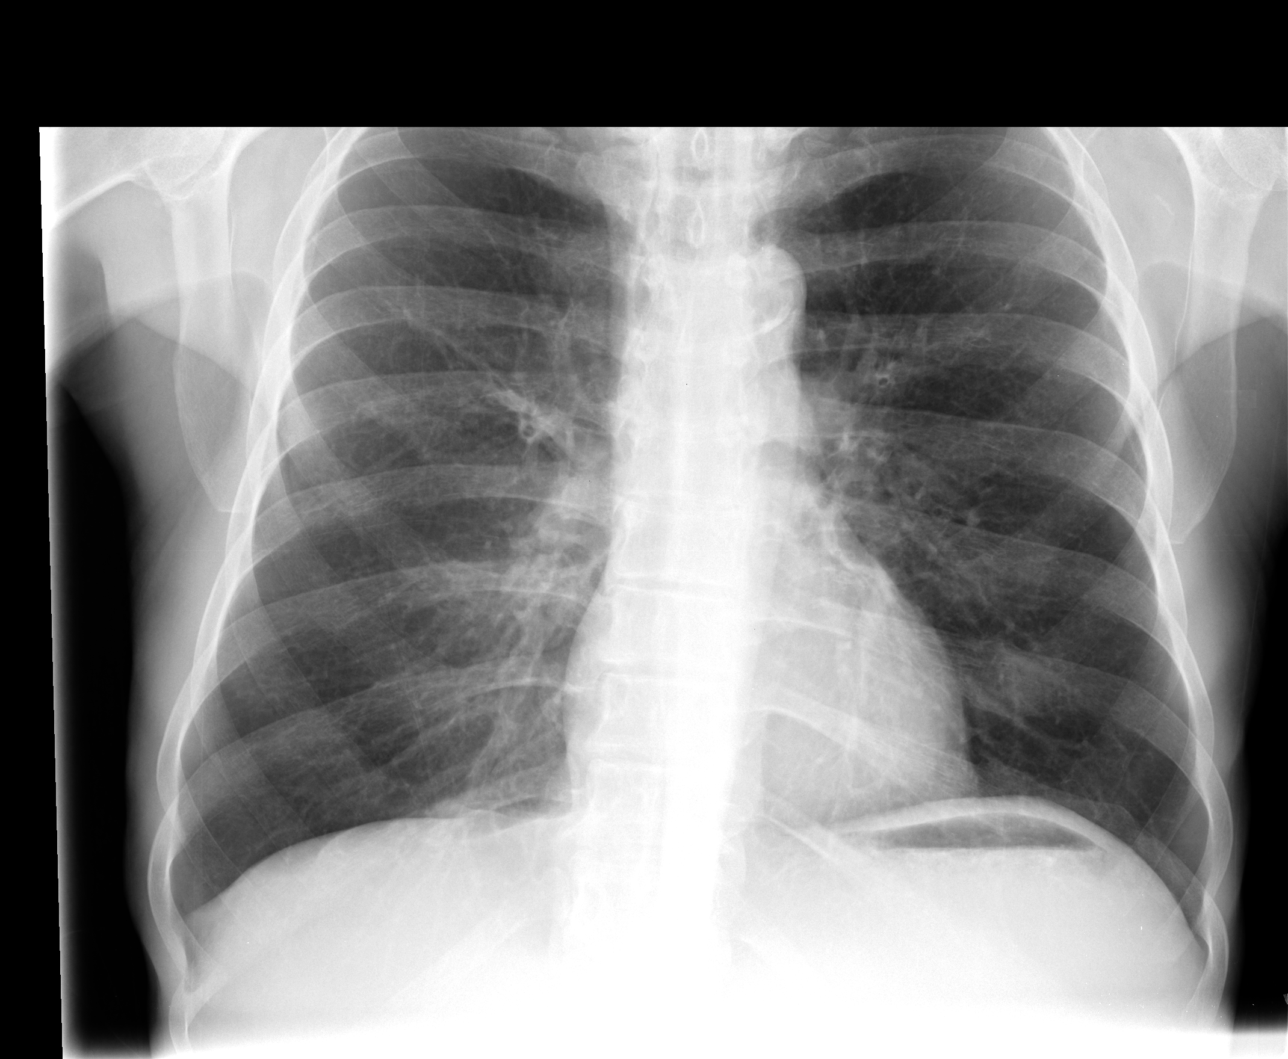

[view not recorded (2 of 3)]
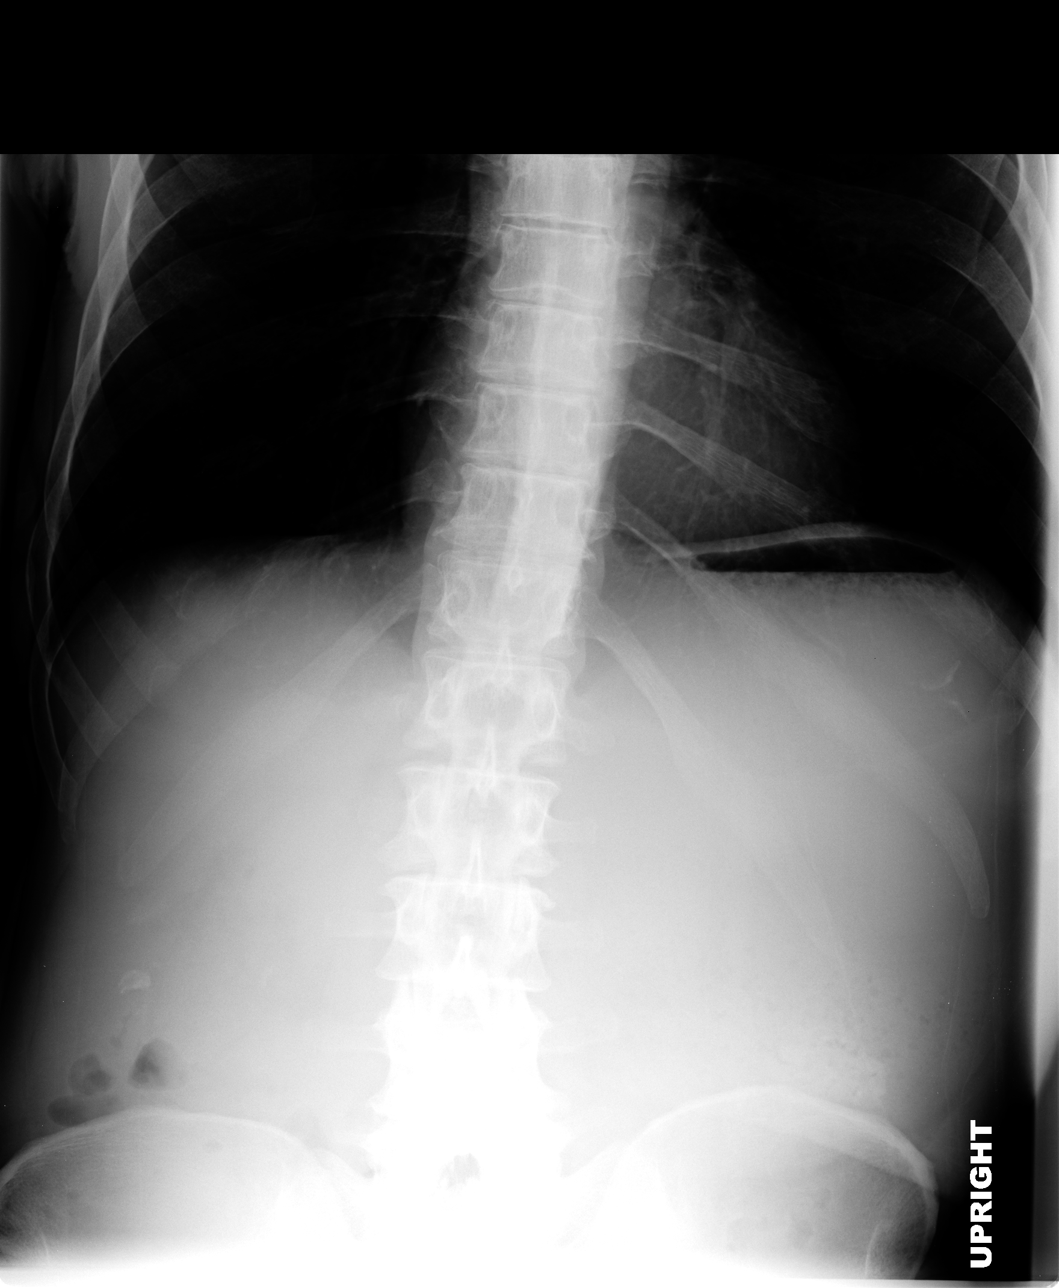

[view not recorded (3 of 3)]
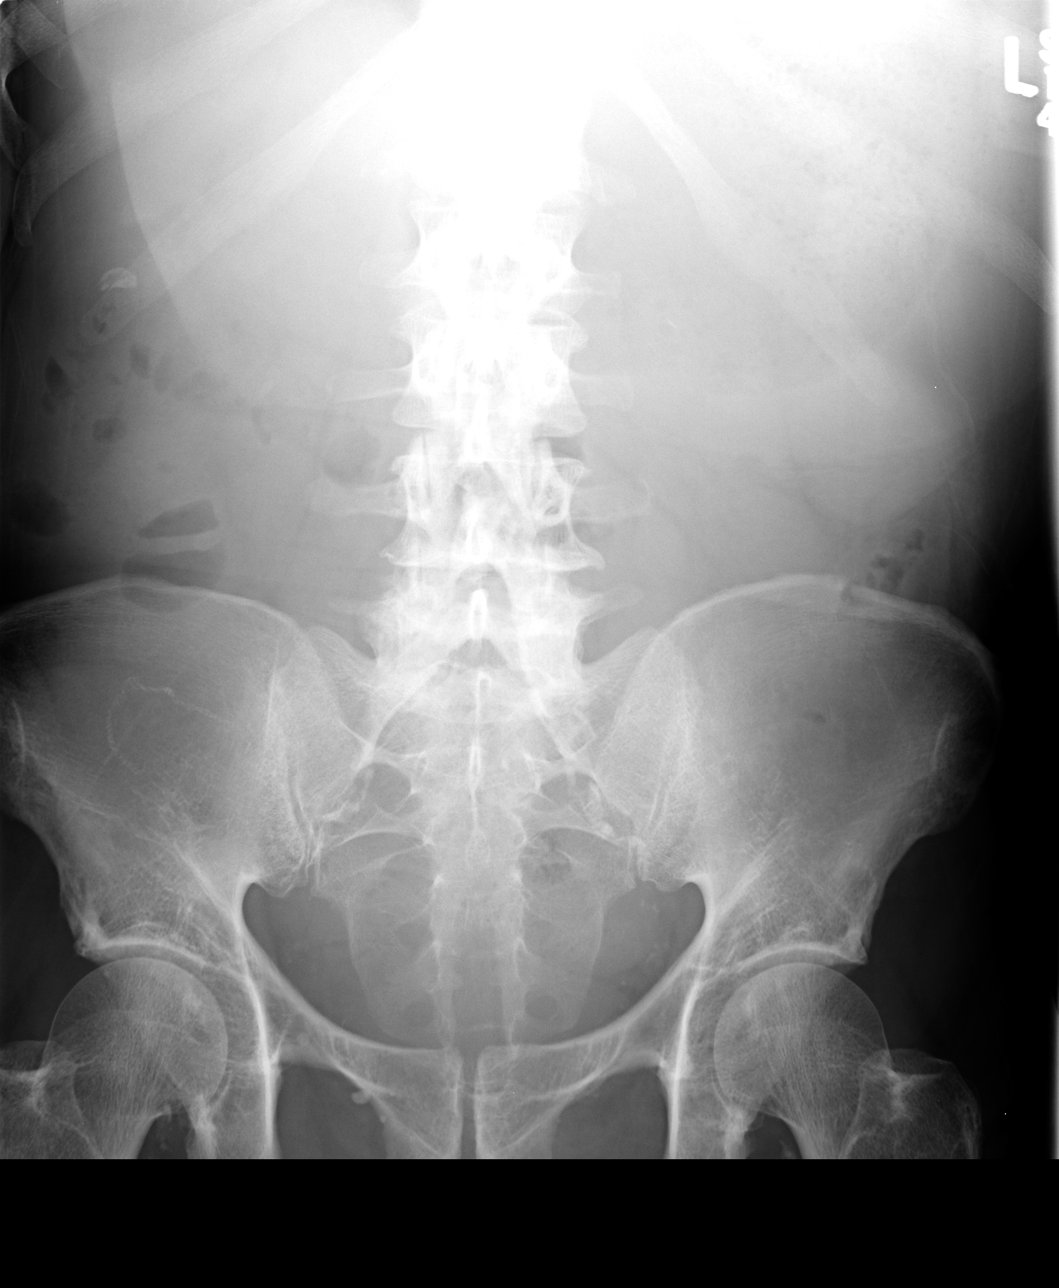

[3 of 3 positions shown; findings below may reference images not displayed]

FINDINGS: Frontal view of the chest demonstrates midline trachea. Normal heart
size. Aortic atherosclerosis. No pleural effusion or pneumothorax.
Hyperinflation/COPD. Clear lungs.

Abdominal films demonstrate no free intraperitoneal air or
significant air-fluid levels on upright positioning. Paucity of
small bowel gas on supine imaging. No significant distal gas
identified. No abnormal abdominal calcifications. The right-sided
abdominal calcification is likely within the enteric stream, when
correlated with recent CT.
IMPRESSION: Nonspecific paucity of abdominal pelvic bowel gas. This could relate
to ascites, given the appearance of the abdomen on prior CT.
Fluid-filled bowel loops cannot be excluded.

No free intraperitoneal air.

Hyperinflation/COPD, without acute cardiopulmonary disease.

## 2014-05-13 ENCOUNTER — Other Ambulatory Visit: Payer: Self-pay | Admitting: *Deleted
